# Patient Record
Sex: Female | Born: 1955 | Race: White | Hispanic: No | Marital: Married | Smoking: Never smoker
Health system: Southern US, Community
[De-identification: ages and names within clinical notes are randomized; demographics above are authoritative.]

## PROBLEM LIST (undated history)

## (undated) DIAGNOSIS — K219 Gastro-esophageal reflux disease without esophagitis: Secondary | ICD-10-CM

## (undated) DIAGNOSIS — E7800 Pure hypercholesterolemia, unspecified: Secondary | ICD-10-CM

## (undated) DIAGNOSIS — I2510 Atherosclerotic heart disease of native coronary artery without angina pectoris: Secondary | ICD-10-CM

## (undated) DIAGNOSIS — I219 Acute myocardial infarction, unspecified: Secondary | ICD-10-CM

## (undated) DIAGNOSIS — E119 Type 2 diabetes mellitus without complications: Secondary | ICD-10-CM

## (undated) DIAGNOSIS — I4891 Unspecified atrial fibrillation: Secondary | ICD-10-CM

## (undated) DIAGNOSIS — J45909 Unspecified asthma, uncomplicated: Secondary | ICD-10-CM

## (undated) DIAGNOSIS — M542 Cervicalgia: Secondary | ICD-10-CM

## (undated) DIAGNOSIS — M545 Low back pain, unspecified: Secondary | ICD-10-CM

## (undated) DIAGNOSIS — K259 Gastric ulcer, unspecified as acute or chronic, without hemorrhage or perforation: Secondary | ICD-10-CM

## (undated) DIAGNOSIS — L509 Urticaria, unspecified: Secondary | ICD-10-CM

## (undated) DIAGNOSIS — G8929 Other chronic pain: Secondary | ICD-10-CM

## (undated) DIAGNOSIS — R200 Anesthesia of skin: Secondary | ICD-10-CM

## (undated) DIAGNOSIS — R519 Headache, unspecified: Secondary | ICD-10-CM

## (undated) DIAGNOSIS — Z7901 Long term (current) use of anticoagulants: Secondary | ICD-10-CM

## (undated) DIAGNOSIS — G43909 Migraine, unspecified, not intractable, without status migrainosus: Secondary | ICD-10-CM

## (undated) DIAGNOSIS — J189 Pneumonia, unspecified organism: Secondary | ICD-10-CM

## (undated) DIAGNOSIS — F988 Other specified behavioral and emotional disorders with onset usually occurring in childhood and adolescence: Secondary | ICD-10-CM

## (undated) DIAGNOSIS — G629 Polyneuropathy, unspecified: Secondary | ICD-10-CM

## (undated) DIAGNOSIS — I10 Essential (primary) hypertension: Secondary | ICD-10-CM

## (undated) DIAGNOSIS — R51 Headache: Secondary | ICD-10-CM

## (undated) DIAGNOSIS — F419 Anxiety disorder, unspecified: Secondary | ICD-10-CM

## (undated) DIAGNOSIS — R011 Cardiac murmur, unspecified: Secondary | ICD-10-CM

## (undated) DIAGNOSIS — E041 Nontoxic single thyroid nodule: Secondary | ICD-10-CM

## (undated) DIAGNOSIS — M1990 Unspecified osteoarthritis, unspecified site: Secondary | ICD-10-CM

## (undated) DIAGNOSIS — E669 Obesity, unspecified: Secondary | ICD-10-CM

## (undated) DIAGNOSIS — E785 Hyperlipidemia, unspecified: Secondary | ICD-10-CM

## (undated) DIAGNOSIS — I4819 Other persistent atrial fibrillation: Secondary | ICD-10-CM

## (undated) DIAGNOSIS — F909 Attention-deficit hyperactivity disorder, unspecified type: Secondary | ICD-10-CM

## (undated) DIAGNOSIS — I209 Angina pectoris, unspecified: Secondary | ICD-10-CM

## (undated) HISTORY — PX: TONSILLECTOMY: SUR1361

## (undated) HISTORY — PX: CORONARY ANGIOPLASTY WITH STENT PLACEMENT: SHX49

## (undated) HISTORY — PX: ADENOIDECTOMY: SUR15

## (undated) HISTORY — DX: Polyneuropathy, unspecified: G62.9

## (undated) HISTORY — PX: ABDOMINAL HYSTERECTOMY: SHX81

## (undated) HISTORY — DX: Long term (current) use of anticoagulants: Z79.01

## (undated) HISTORY — DX: Unspecified atrial fibrillation: I48.91

## (undated) HISTORY — DX: Anesthesia of skin: R20.0

## (undated) HISTORY — DX: Atherosclerotic heart disease of native coronary artery without angina pectoris: I25.10

## (undated) HISTORY — PX: BIOPSY THYROID: PRO38

## (undated) HISTORY — DX: Other specified behavioral and emotional disorders with onset usually occurring in childhood and adolescence: F98.8

## (undated) HISTORY — DX: Acute myocardial infarction, unspecified: I21.9

## (undated) HISTORY — DX: Essential (primary) hypertension: I10

## (undated) HISTORY — DX: Type 2 diabetes mellitus without complications: E11.9

## (undated) HISTORY — DX: Hyperlipidemia, unspecified: E78.5

## (undated) HISTORY — PX: BREAST EXCISIONAL BIOPSY: SUR124

## (undated) HISTORY — PX: TRIGGER FINGER RELEASE: SHX641

## (undated) HISTORY — DX: Urticaria, unspecified: L50.9

## (undated) HISTORY — PX: TONSILLECTOMY AND ADENOIDECTOMY: SUR1326

## (undated) HISTORY — DX: Obesity, unspecified: E66.9

---
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
## 1981-02-28 DIAGNOSIS — Z9289 Personal history of other medical treatment: Secondary | ICD-10-CM

## 1981-02-28 HISTORY — DX: Personal history of other medical treatment: Z92.89

## 2016-03-21 ENCOUNTER — Encounter (INDEPENDENT_AMBULATORY_CARE_PROVIDER_SITE_OTHER): Payer: Self-pay

## 2016-03-21 ENCOUNTER — Ambulatory Visit (INDEPENDENT_AMBULATORY_CARE_PROVIDER_SITE_OTHER): Payer: Commercial Managed Care - PPO | Admitting: Cardiovascular Disease

## 2016-03-21 ENCOUNTER — Encounter: Payer: Self-pay | Admitting: Cardiovascular Disease

## 2016-03-21 VITALS — BP 140/74 | HR 57 | Ht 68.0 in | Wt 206.8 lb

## 2016-03-21 DIAGNOSIS — Z79899 Other long term (current) drug therapy: Secondary | ICD-10-CM

## 2016-03-21 DIAGNOSIS — E7800 Pure hypercholesterolemia, unspecified: Secondary | ICD-10-CM

## 2016-03-21 DIAGNOSIS — R011 Cardiac murmur, unspecified: Secondary | ICD-10-CM | POA: Diagnosis not present

## 2016-03-21 NOTE — Patient Instructions (Signed)
Medication Instructions:  The current medical regimen is effective;  continue present plan and medications.  Labwork: Please schedule to come back in the next several days to have fasting blood work.  (BMP, LFT and Lipid)  Testing/Procedures: Your physician has requested that you have an echocardiogram. Echocardiography is a painless test that uses sound waves to create images of your heart. It provides your doctor with information about the size and shape of your heart and how well your heart's chambers and valves are working. This procedure takes approximately one hour. There are no restrictions for this procedure.  Follow-Up: Follow up in 6 months with Dr. Acie Fredrickson.  You will receive a letter in the mail 2 months before you are due.  Please call us when you receive this letter to schedule your follow up appointment.  If you need a refill on your cardiac medications before your next appointment, please call your pharmacy.  Thank you for choosing [facility]!!

## 2016-03-21 NOTE — Progress Notes (Signed)
Cardiology Office Note   Date:  03/21/2016   ID:  Brittany Buckley, DOB [birth year], MRN [id number]  PCP:  Marton Redwood, MD  Cardiologist:   Mertie Moores, MD   Chief Complaint  Patient presents with  . Coronary Artery Disease  . Hyperlipidemia   Problem list 1. Coronary artery disease-status post PCI in 2014, 2015 [place], [place]. [place]  Dr. Marjory Lies.  2. Essential hypertension 3. Hyperlipidemia 4. Paroxysmal atrial fibrillation   History of Present Illness: Brittany Buckley is a 61 y.o. female who presents for further evaluation and management of her history of coronary artery disease.  Has some left sided neck pain   Has rare episodes of palpitations at night - discussed that these are likely PVCs. She has had some PAF in the past.   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    Does not get any regular exercise .   Lack of energy. Tired at the end of the day . Admits that she is a couch potato.     Past Medical History:  Diagnosis Date  . A-fib ([place])   . ADD (attention deficit disorder)   . Coronary artery disease   . Diabetes mellitus without complication ([place])   . Hyperlipidemia   . Hypertension   . MI (myocardial infarction)   . Numbness of toes   . Obesity   . On anticoagulant therapy   . Peripheral neuropathy ([place])   . Urticaria     Past Surgical History:  Procedure Laterality Date  . FINGER SURGERY    . PERCUTANEOUS CORONARY STENT INTERVENTION (PCI-S)  2014 & 2015   x2   . TONSILLECTOMY       Current Outpatient Prescriptions  Medication Sig Dispense Refill  . atorvastatin (LIPITOR) 80 MG tablet Take 80 mg by mouth daily.    . dabigatran (PRADAXA) 150 MG CAPS capsule Take 150 mg by mouth 2 (two) times daily.    . furosemide (LASIX) 40 MG tablet Take 40 mg by mouth daily.    Marland Kitchen glipiZIDE (GLUCOTROL) 5 MG tablet Take by mouth daily before breakfast.    . hydrALAZINE (APRESOLINE) 50 MG tablet Take 50 mg by mouth 3 (three) times daily.    Marland Kitchen  lisinopril (PRINIVIL,ZESTRIL) 40 MG tablet Take 40 mg by mouth daily.    . metFORMIN (GLUCOPHAGE) 1000 MG tablet Take 1,000 mg by mouth 2 (two) times daily with a meal.    . metoprolol (LOPRESSOR) 50 MG tablet Take 50 mg by mouth 2 (two) times daily.    . nitroGLYCERIN (NITROSTAT) 0.4 MG SL tablet Place 0.4 mg under the tongue every 5 (five) minutes as needed for chest pain.    . RaNITidine HCl (ZANTAC PO) Take by mouth.    . SUMAtriptan (IMITREX) 100 MG tablet Take 100 mg by mouth every 2 (two) hours as needed for migraine. May repeat in 2 hours if headache persists or recurs.     No current facility-administered medications for this visit.     Allergies:   Codeine and Demerol [meperidine]    Social History:  The patient  reports that she has never smoked. She has never used smokeless tobacco. She reports that she drinks alcohol. She reports that she does not use drugs.   Family History:  The patient's family history includes CAD in her father; Cancer - Ovarian in her sister; Diabetes in her maternal grandmother and paternal grandfather; Healthy in her son; Heart attack in her father; Heart  disease in her paternal grandmother; Hyperlipidemia in her mother; Obesity in her daughter.    ROS:  Please see the history of present illness.    Review of Systems: Constitutional:  denies fever, chills, diaphoresis, appetite change and fatigue.  HEENT: denies photophobia, eye pain, redness, hearing loss, ear pain, congestion, sore throat, rhinorrhea, sneezing, neck pain, neck stiffness and tinnitus.  Respiratory: denies SOB, DOE, cough, chest tightness, and wheezing.  Cardiovascular: denies chest pain, palpitations and leg swelling.  Gastrointestinal: denies nausea, vomiting, abdominal pain, diarrhea, constipation, blood in stool.  Genitourinary: denies dysuria, urgency, frequency, hematuria, flank pain and difficulty urinating.  Musculoskeletal: denies  myalgias, back pain, joint swelling,  arthralgias and gait problem.   Skin: denies pallor, rash and wound.  Neurological: denies dizziness, seizures, syncope, weakness, light-headedness, numbness and headaches.   Hematological: denies adenopathy, easy bruising, personal or family bleeding history.  Psychiatric/ Behavioral: denies suicidal ideation, mood changes, confusion, nervousness, sleep disturbance and agitation.       All other systems are reviewed and negative.    PHYSICAL EXAM: VS:  BP 140/74 (BP Location: Left Arm, Patient Position: Sitting, Cuff Size: Large)   Pulse (!) 57   Ht 5\' 8"  (1.727 m)   Wt 206 lb 12.8 oz (93.8 kg)   SpO2 95%   BMI 31.44 kg/m  , BMI Body mass index is 31.44 kg/m. GEN: Well nourished, well developed, in no acute distress  HEENT: normal  Neck: no JVD, soft B carotid bruits vs. Radiation from her cardiac murmur   Cardiac: RRR;  1-2 / 6 brief systolic murmur at ULSB radiating to URSB.   ,   Respiratory:  clear to auscultation bilaterally, normal work of breathing GI: soft, nontender, nondistended, + BS MS: no deformity or atrophy  Skin: warm and dry, no rash Neuro:  Strength and sensation are intact Psych: normal   EKG:  EKG is ordered today. The ekg ordered today demonstrates  Sinus brady at 57.  Otherwise normal eCG    Recent Labs: No results found for requested labs within last 8760 hours.    Lipid Panel No results found for: CHOL, TRIG, HDL, CHOLHDL, VLDL, LDLCALC, LDLDIRECT    Wt Readings from Last 3 Encounters:  03/21/16 206 lb 12.8 oz (93.8 kg)      Other studies Reviewed: Additional studies/ records that were reviewed today include: . Review of the above records demonstrates:    ASSESSMENT AND PLAN:  1. CAD- She seems to be stable. She's not having any episodes of angina.  2. Murmur  - she has a soft systolic murmur. She may have some mild aortic stenosis. We will get an echo card gram for further evaluation.     Current medicines are reviewed at length  with the patient today.  The patient has concerns regarding medicines.  Labs/ tests ordered today include:  No orders of the defined types were placed in this encounter.  Disposition:   FU with me in 1 year.     Mertie Moores, MD  03/21/2016 4:11 PM    [facility] [address], [place], [place]  [address] Phone: [phone]; Fax: [phone]

## 2016-03-23 ENCOUNTER — Encounter: Payer: Self-pay | Admitting: Cardiovascular Disease

## 2016-04-04 ENCOUNTER — Other Ambulatory Visit: Payer: Commercial Managed Care - PPO | Admitting: *Deleted

## 2016-04-04 ENCOUNTER — Ambulatory Visit (HOSPITAL_COMMUNITY): Payer: Commercial Managed Care - PPO | Attending: Cardiovascular Disease

## 2016-04-04 ENCOUNTER — Other Ambulatory Visit: Payer: Self-pay

## 2016-04-04 DIAGNOSIS — I2510 Atherosclerotic heart disease of native coronary artery without angina pectoris: Secondary | ICD-10-CM | POA: Diagnosis not present

## 2016-04-04 DIAGNOSIS — Z79899 Other long term (current) drug therapy: Secondary | ICD-10-CM

## 2016-04-04 DIAGNOSIS — R011 Cardiac murmur, unspecified: Secondary | ICD-10-CM

## 2016-04-04 DIAGNOSIS — E7800 Pure hypercholesterolemia, unspecified: Secondary | ICD-10-CM | POA: Diagnosis not present

## 2016-04-04 DIAGNOSIS — Z719 Counseling, unspecified: Secondary | ICD-10-CM | POA: Diagnosis not present

## 2016-04-04 LAB — ECHOCARDIOGRAM COMPLETE
Ao-asc: 31 cm
CHL CUP DOP CALC LVOT VTI: 33 cm
CHL CUP MV DEC (S): 225
EERAT: 16.06
EWDT: 225 ms
FS: 32 % (ref 28–44)
IVS/LV PW RATIO, ED: 1.32
LA ID, A-P, ES: 43 mm
LADIAMINDEX: 2.08 cm/m2
LAVOL: 72 mL
LAVOLA4C: 54 mL
LAVOLIN: 34.8 mL/m2
LEFT ATRIUM END SYS DIAM: 43 mm
LV E/e' medial: 16.06
LV SIMPSON'S DISK: 65
LV TDI E'MEDIAL: 5.46
LV dias vol index: 42 mL/m2
LV dias vol: 86 mL (ref 46–106)
LV sys vol: 30 mL (ref 14–42)
LVEEAVG: 16.06
LVELAT: 5.26 cm/s
LVOT SV: 94 mL
LVOT area: 2.84 cm2
LVOT diameter: 19 mm
LVOT peak grad rest: 7 mmHg
LVOT peak vel: 134 cm/s
LVSYSVOLIN: 14 mL/m2
MV pk A vel: 84.5 m/s
MVPG: 3 mmHg
MVPKEVEL: 84.5 m/s
PW: 11.3 mm — AB (ref 0.6–1.1)
S' Lateral: 13.5 cm/s
Stroke v: 56 ml
TDI e' lateral: 5.26

## 2016-04-05 LAB — LIPID PANEL
CHOLESTEROL TOTAL: 179 mg/dL (ref 100–199)
Chol/HDL Ratio: 3.8 ratio units (ref 0.0–4.4)
HDL: 47 mg/dL (ref >39)
LDL Calculated: 96 mg/dL (ref 0–99)
TRIGLYCERIDES: 180 mg/dL — AB (ref 0–149)
VLDL Cholesterol Cal: 36 mg/dL (ref 5–40)

## 2016-04-05 LAB — HEPATIC FUNCTION PANEL
ALT: 18 IU/L (ref 0–32)
AST: 15 IU/L (ref 0–40)
Albumin: 4.3 g/dL (ref 3.5–5.5)
Alkaline Phosphatase: 75 IU/L (ref 39–117)
Bilirubin Total: 0.3 mg/dL (ref 0.0–1.2)
Bilirubin, Direct: 0.08 mg/dL (ref 0.00–0.40)
Total Protein: 6.5 g/dL (ref 6.0–8.5)

## 2016-04-05 LAB — BASIC METABOLIC PANEL
BUN/Creatinine Ratio: 32 — ABNORMAL HIGH (ref 9–23)
BUN: 23 mg/dL (ref 6–24)
CALCIUM: 9 mg/dL (ref 8.7–10.2)
CHLORIDE: 104 mmol/L (ref 96–106)
CO2: 25 mmol/L (ref 18–29)
Creatinine, Ser: 0.71 mg/dL (ref 0.57–1.00)
GFR calc non Af Amer: 94 mL/min/{1.73_m2} (ref >59)
GFR, EST AFRICAN AMERICAN: 108 mL/min/{1.73_m2} (ref >59)
GLUCOSE: 139 mg/dL — AB (ref 65–99)
POTASSIUM: 4.2 mmol/L (ref 3.5–5.2)
Sodium: 143 mmol/L (ref 134–144)

## 2016-04-07 ENCOUNTER — Encounter: Payer: Self-pay | Admitting: Nurse Practitioner

## 2016-04-11 DIAGNOSIS — Z719 Counseling, unspecified: Secondary | ICD-10-CM | POA: Diagnosis not present

## 2016-04-19 DIAGNOSIS — Z719 Counseling, unspecified: Secondary | ICD-10-CM | POA: Diagnosis not present

## 2016-04-25 DIAGNOSIS — Z719 Counseling, unspecified: Secondary | ICD-10-CM | POA: Diagnosis not present

## 2016-05-02 DIAGNOSIS — Z719 Counseling, unspecified: Secondary | ICD-10-CM | POA: Diagnosis not present

## 2016-05-09 DIAGNOSIS — Z719 Counseling, unspecified: Secondary | ICD-10-CM | POA: Diagnosis not present

## 2016-05-17 DIAGNOSIS — Z719 Counseling, unspecified: Secondary | ICD-10-CM | POA: Diagnosis not present

## 2016-05-23 DIAGNOSIS — Z719 Counseling, unspecified: Secondary | ICD-10-CM | POA: Diagnosis not present

## 2016-06-06 DIAGNOSIS — Z719 Counseling, unspecified: Secondary | ICD-10-CM | POA: Diagnosis not present

## 2016-06-10 DIAGNOSIS — I10 Essential (primary) hypertension: Secondary | ICD-10-CM | POA: Diagnosis not present

## 2016-06-10 DIAGNOSIS — E1149 Type 2 diabetes mellitus with other diabetic neurological complication: Secondary | ICD-10-CM | POA: Diagnosis not present

## 2016-06-13 DIAGNOSIS — Z719 Counseling, unspecified: Secondary | ICD-10-CM | POA: Diagnosis not present

## 2016-06-14 ENCOUNTER — Emergency Department (HOSPITAL_COMMUNITY): Payer: Commercial Managed Care - PPO

## 2016-06-14 ENCOUNTER — Inpatient Hospital Stay (HOSPITAL_COMMUNITY)
Admission: EM | Admit: 2016-06-14 | Discharge: 2016-06-17 | DRG: 310 | Disposition: A | Discharge status: Home / Self Care | Payer: Commercial Managed Care - PPO | Attending: Cardiovascular Disease | Admitting: Cardiovascular Disease

## 2016-06-14 ENCOUNTER — Encounter (HOSPITAL_COMMUNITY): Payer: Self-pay

## 2016-06-14 DIAGNOSIS — Z79899 Other long term (current) drug therapy: Secondary | ICD-10-CM

## 2016-06-14 DIAGNOSIS — Z7984 Long term (current) use of oral hypoglycemic drugs: Secondary | ICD-10-CM | POA: Diagnosis not present

## 2016-06-14 DIAGNOSIS — R079 Chest pain, unspecified: Secondary | ICD-10-CM | POA: Diagnosis not present

## 2016-06-14 DIAGNOSIS — I208 Other forms of angina pectoris: Secondary | ICD-10-CM | POA: Diagnosis not present

## 2016-06-14 DIAGNOSIS — E1169 Type 2 diabetes mellitus with other specified complication: Secondary | ICD-10-CM

## 2016-06-14 DIAGNOSIS — I358 Other nonrheumatic aortic valve disorders: Secondary | ICD-10-CM | POA: Diagnosis not present

## 2016-06-14 DIAGNOSIS — T462X5A Adverse effect of other antidysrhythmic drugs, initial encounter: Secondary | ICD-10-CM | POA: Diagnosis not present

## 2016-06-14 DIAGNOSIS — I25119 Atherosclerotic heart disease of native coronary artery with unspecified angina pectoris: Secondary | ICD-10-CM | POA: Diagnosis not present

## 2016-06-14 DIAGNOSIS — R000 Tachycardia, unspecified: Secondary | ICD-10-CM | POA: Diagnosis not present

## 2016-06-14 DIAGNOSIS — I480 Paroxysmal atrial fibrillation: Secondary | ICD-10-CM | POA: Diagnosis not present

## 2016-06-14 DIAGNOSIS — Z821 Family history of blindness and visual loss: Secondary | ICD-10-CM

## 2016-06-14 DIAGNOSIS — Z8041 Family history of malignant neoplasm of ovary: Secondary | ICD-10-CM | POA: Diagnosis not present

## 2016-06-14 DIAGNOSIS — E669 Obesity, unspecified: Secondary | ICD-10-CM | POA: Diagnosis not present

## 2016-06-14 DIAGNOSIS — Y92230 Patient room in hospital as the place of occurrence of the external cause: Secondary | ICD-10-CM | POA: Diagnosis not present

## 2016-06-14 DIAGNOSIS — R0602 Shortness of breath: Secondary | ICD-10-CM | POA: Diagnosis not present

## 2016-06-14 DIAGNOSIS — Z6830 Body mass index (BMI) 30.0-30.9, adult: Secondary | ICD-10-CM

## 2016-06-14 DIAGNOSIS — I10 Essential (primary) hypertension: Secondary | ICD-10-CM | POA: Diagnosis present

## 2016-06-14 DIAGNOSIS — I25118 Atherosclerotic heart disease of native coronary artery with other forms of angina pectoris: Secondary | ICD-10-CM | POA: Diagnosis not present

## 2016-06-14 DIAGNOSIS — I700 Atherosclerosis of aorta: Secondary | ICD-10-CM | POA: Diagnosis present

## 2016-06-14 DIAGNOSIS — Z955 Presence of coronary angioplasty implant and graft: Secondary | ICD-10-CM

## 2016-06-14 DIAGNOSIS — Z7901 Long term (current) use of anticoagulants: Secondary | ICD-10-CM

## 2016-06-14 DIAGNOSIS — R0789 Other chest pain: Secondary | ICD-10-CM | POA: Diagnosis not present

## 2016-06-14 DIAGNOSIS — E1142 Type 2 diabetes mellitus with diabetic polyneuropathy: Secondary | ICD-10-CM | POA: Diagnosis not present

## 2016-06-14 DIAGNOSIS — E785 Hyperlipidemia, unspecified: Secondary | ICD-10-CM | POA: Diagnosis not present

## 2016-06-14 DIAGNOSIS — I4581 Long QT syndrome: Secondary | ICD-10-CM | POA: Diagnosis present

## 2016-06-14 DIAGNOSIS — I252 Old myocardial infarction: Secondary | ICD-10-CM

## 2016-06-14 DIAGNOSIS — I472 Ventricular tachycardia: Secondary | ICD-10-CM | POA: Diagnosis not present

## 2016-06-14 DIAGNOSIS — I959 Hypotension, unspecified: Secondary | ICD-10-CM | POA: Diagnosis not present

## 2016-06-14 DIAGNOSIS — Z885 Allergy status to narcotic agent status: Secondary | ICD-10-CM

## 2016-06-14 DIAGNOSIS — Z8249 Family history of ischemic heart disease and other diseases of the circulatory system: Secondary | ICD-10-CM

## 2016-06-14 DIAGNOSIS — Z833 Family history of diabetes mellitus: Secondary | ICD-10-CM | POA: Diagnosis not present

## 2016-06-14 DIAGNOSIS — R011 Cardiac murmur, unspecified: Secondary | ICD-10-CM | POA: Diagnosis present

## 2016-06-14 DIAGNOSIS — I4891 Unspecified atrial fibrillation: Secondary | ICD-10-CM

## 2016-06-14 HISTORY — DX: Cardiac murmur, unspecified: R01.1

## 2016-06-14 HISTORY — DX: Type 2 diabetes mellitus without complications: E11.9

## 2016-06-14 HISTORY — DX: Unspecified osteoarthritis, unspecified site: M19.90

## 2016-06-14 HISTORY — DX: Headache, unspecified: R51.9

## 2016-06-14 HISTORY — DX: Migraine, unspecified, not intractable, without status migrainosus: G43.909

## 2016-06-14 HISTORY — DX: Other chronic pain: G89.29

## 2016-06-14 HISTORY — DX: Pneumonia, unspecified organism: J18.9

## 2016-06-14 HISTORY — DX: Gastro-esophageal reflux disease without esophagitis: K21.9

## 2016-06-14 HISTORY — DX: Nontoxic single thyroid nodule: E04.1

## 2016-06-14 HISTORY — DX: Unspecified asthma, uncomplicated: J45.909

## 2016-06-14 HISTORY — DX: Cervicalgia: M54.2

## 2016-06-14 HISTORY — DX: Low back pain, unspecified: M54.50

## 2016-06-14 HISTORY — DX: Angina pectoris, unspecified: I20.9

## 2016-06-14 HISTORY — DX: Low back pain: M54.5

## 2016-06-14 HISTORY — DX: Headache: R51

## 2016-06-14 HISTORY — DX: Anxiety disorder, unspecified: F41.9

## 2016-06-14 LAB — BASIC METABOLIC PANEL
ANION GAP: 9 (ref 5–15)
Anion gap: 15 (ref 5–15)
BUN: 15 mg/dL (ref 6–20)
BUN: 17 mg/dL (ref 6–20)
CALCIUM: 8.9 mg/dL (ref 8.9–10.3)
CHLORIDE: 107 mmol/L (ref 101–111)
CO2: 24 mmol/L (ref 22–32)
CO2: 22 mmol/L (ref 22–32)
CREATININE: 0.86 mg/dL (ref 0.44–1.00)
Calcium: 8.1 mg/dL — ABNORMAL LOW (ref 8.9–10.3)
Chloride: 102 mmol/L (ref 101–111)
Creatinine, Ser: 0.83 mg/dL (ref 0.44–1.00)
GFR calc non Af Amer: >60 mL/min (ref >60)
GFR calc non Af Amer: >60 mL/min (ref >60)
Glucose, Bld: 196 mg/dL — ABNORMAL HIGH (ref 65–99)
Glucose, Bld: 74 mg/dL (ref 65–99)
POTASSIUM: 4 mmol/L (ref 3.5–5.1)
Potassium: 3.8 mmol/L (ref 3.5–5.1)
SODIUM: 141 mmol/L (ref 135–145)
SODIUM: 138 mmol/L (ref 135–145)

## 2016-06-14 LAB — CBC
HCT: 42.3 % (ref 36.0–46.0)
Hemoglobin: 14.1 g/dL (ref 12.0–15.0)
MCH: 27.8 pg (ref 26.0–34.0)
MCHC: 33.3 g/dL (ref 30.0–36.0)
MCV: 83.3 fL (ref 78.0–100.0)
PLATELETS: 182 10*3/uL (ref 150–400)
RBC: 5.08 MIL/uL (ref 3.87–5.11)
RDW: 14 % (ref 11.5–15.5)
WBC: 8.6 10*3/uL (ref 4.0–10.5)

## 2016-06-14 LAB — MAGNESIUM
MAGNESIUM: 1.5 mg/dL — AB (ref 1.7–2.4)
Magnesium: 2.3 mg/dL (ref 1.7–2.4)

## 2016-06-14 LAB — TSH: TSH: 0.923 u[IU]/mL (ref 0.350–4.500)

## 2016-06-14 LAB — I-STAT TROPONIN, ED: TROPONIN I, POC: 0 ng/mL (ref 0.00–0.08)

## 2016-06-14 LAB — POTASSIUM: POTASSIUM: 3.7 mmol/L (ref 3.5–5.1)

## 2016-06-14 MED ORDER — SODIUM CHLORIDE 0.9% FLUSH
3.0000 mL | Freq: Two times a day (BID) | INTRAVENOUS | Status: DC
  Administered 2016-06-16: 3 mL via INTRAVENOUS

## 2016-06-14 MED ORDER — ONDANSETRON HCL 4 MG/2ML IJ SOLN: 4.0000 mg | Freq: Four times a day (QID) | INTRAMUSCULAR | Status: DC | PRN

## 2016-06-14 MED ORDER — FUROSEMIDE 40 MG PO TABS
40.0000 mg | ORAL_TABLET | Freq: Every day | ORAL | Status: DC
  Administered 2016-06-15 – 2016-06-17 (×3): 40 mg via ORAL
  Filled 2016-06-14 (×3): qty 1

## 2016-06-14 MED ORDER — HYDRALAZINE HCL 50 MG PO TABS
50.0000 mg | ORAL_TABLET | Freq: Three times a day (TID) | ORAL | Status: DC
  Administered 2016-06-14 – 2016-06-17 (×9): 50 mg via ORAL
  Filled 2016-06-14 (×9): qty 1

## 2016-06-14 MED ORDER — SODIUM CHLORIDE 0.9% FLUSH
3.0000 mL | Freq: Two times a day (BID) | INTRAVENOUS | Status: DC
  Administered 2016-06-14 – 2016-06-16 (×5): 3 mL via INTRAVENOUS

## 2016-06-14 MED ORDER — POTASSIUM CHLORIDE CRYS ER 20 MEQ PO TBCR: 30.0000 meq | EXTENDED_RELEASE_TABLET | ORAL | Status: DC

## 2016-06-14 MED ORDER — ACETAMINOPHEN 325 MG PO TABS
650.0000 mg | ORAL_TABLET | ORAL | Status: DC | PRN
  Administered 2016-06-14 – 2016-06-17 (×6): 650 mg via ORAL
  Filled 2016-06-14 (×6): qty 2

## 2016-06-14 MED ORDER — DILTIAZEM HCL ER COATED BEADS 120 MG PO CP24
120.0000 mg | ORAL_CAPSULE | Freq: Every day | ORAL | Status: DC
  Administered 2016-06-14 – 2016-06-17 (×4): 120 mg via ORAL
  Filled 2016-06-14 (×4): qty 1

## 2016-06-14 MED ORDER — ASPIRIN EC 325 MG PO TBEC
325.0000 mg | DELAYED_RELEASE_TABLET | Freq: Once | ORAL | Status: AC
  Administered 2016-06-14: 325 mg via ORAL
  Filled 2016-06-14: qty 1

## 2016-06-14 MED ORDER — GLIPIZIDE 5 MG PO TABS
5.0000 mg | ORAL_TABLET | Freq: Every day | ORAL | Status: DC
  Administered 2016-06-15 – 2016-06-17 (×3): 5 mg via ORAL
  Filled 2016-06-14 (×3): qty 1

## 2016-06-14 MED ORDER — SODIUM CHLORIDE 0.9 % IV SOLN: 250.0000 mL | INTRAVENOUS | Status: DC | PRN

## 2016-06-14 MED ORDER — POTASSIUM CHLORIDE CRYS ER 20 MEQ PO TBCR
40.0000 meq | EXTENDED_RELEASE_TABLET | ORAL | Status: AC
  Administered 2016-06-14 (×2): 40 meq via ORAL
  Filled 2016-06-14 (×2): qty 2

## 2016-06-14 MED ORDER — NITROGLYCERIN 0.4 MG SL SUBL
0.4000 mg | SUBLINGUAL_TABLET | SUBLINGUAL | Status: DC | PRN
  Administered 2016-06-14: 0.4 mg via SUBLINGUAL
  Filled 2016-06-14: qty 1

## 2016-06-14 MED ORDER — METOPROLOL TARTRATE 5 MG/5ML IV SOLN
5.0000 mg | Freq: Once | INTRAVENOUS | Status: AC
  Administered 2016-06-14: 5 mg via INTRAVENOUS
  Filled 2016-06-14: qty 5

## 2016-06-14 MED ORDER — SODIUM CHLORIDE 0.9% FLUSH: 3.0000 mL | INTRAVENOUS | Status: DC | PRN

## 2016-06-14 MED ORDER — METOPROLOL TARTRATE 25 MG PO TABS
75.0000 mg | ORAL_TABLET | Freq: Two times a day (BID) | ORAL | Status: DC
  Administered 2016-06-14 – 2016-06-17 (×3): 75 mg via ORAL
  Filled 2016-06-14 (×6): qty 1

## 2016-06-14 MED ORDER — ETOMIDATE 2 MG/ML IV SOLN
0.1500 mg/kg | Freq: Once | INTRAVENOUS | Status: AC
  Administered 2016-06-14: 13.42 mg via INTRAVENOUS
  Filled 2016-06-14: qty 10

## 2016-06-14 MED ORDER — DILTIAZEM HCL-DEXTROSE 100-5 MG/100ML-% IV SOLN (PREMIX)
5.0000 mg/h | INTRAVENOUS | Status: DC
  Administered 2016-06-14: 5 mg/h via INTRAVENOUS
  Filled 2016-06-14: qty 100

## 2016-06-14 MED ORDER — AMIODARONE HCL IN DEXTROSE 360-4.14 MG/200ML-% IV SOLN: 30.0000 mg/h | INTRAVENOUS | Status: DC

## 2016-06-14 MED ORDER — MORPHINE SULFATE (PF) 4 MG/ML IV SOLN
2.0000 mg | Freq: Once | INTRAVENOUS | Status: AC
  Administered 2016-06-14: 2 mg via INTRAVENOUS
  Filled 2016-06-14: qty 1

## 2016-06-14 MED ORDER — LISINOPRIL 40 MG PO TABS
40.0000 mg | ORAL_TABLET | Freq: Every day | ORAL | Status: DC
  Administered 2016-06-15 – 2016-06-17 (×3): 40 mg via ORAL
  Filled 2016-06-14 (×3): qty 1

## 2016-06-14 MED ORDER — AMIODARONE LOAD VIA INFUSION
150.0000 mg | Freq: Once | INTRAVENOUS | Status: AC
  Administered 2016-06-14: 150 mg via INTRAVENOUS
  Filled 2016-06-14: qty 83.34

## 2016-06-14 MED ORDER — DABIGATRAN ETEXILATE MESYLATE 150 MG PO CAPS
150.0000 mg | ORAL_CAPSULE | Freq: Two times a day (BID) | ORAL | Status: DC
  Administered 2016-06-14 – 2016-06-17 (×6): 150 mg via ORAL
  Filled 2016-06-14 (×6): qty 1

## 2016-06-14 MED ORDER — DOFETILIDE 500 MCG PO CAPS
500.0000 ug | ORAL_CAPSULE | Freq: Two times a day (BID) | ORAL | Status: DC
  Administered 2016-06-14: 500 ug via ORAL
  Filled 2016-06-14: qty 1

## 2016-06-14 MED ORDER — MAGNESIUM SULFATE 2 GM/50ML IV SOLN
2.0000 g | Freq: Once | INTRAVENOUS | Status: AC
  Administered 2016-06-14: 2 g via INTRAVENOUS
  Filled 2016-06-14: qty 50

## 2016-06-14 MED ORDER — AMIODARONE HCL IN DEXTROSE 360-4.14 MG/200ML-% IV SOLN
60.0000 mg/h | INTRAVENOUS | Status: DC
  Administered 2016-06-14: 60 mg/h via INTRAVENOUS

## 2016-06-14 MED ORDER — POTASSIUM CHLORIDE CRYS ER 20 MEQ PO TBCR
40.0000 meq | EXTENDED_RELEASE_TABLET | Freq: Once | ORAL | Status: AC
  Administered 2016-06-14: 40 meq via ORAL
  Filled 2016-06-14: qty 2

## 2016-06-14 MED ORDER — METFORMIN HCL 500 MG PO TABS
1000.0000 mg | ORAL_TABLET | Freq: Two times a day (BID) | ORAL | Status: DC
  Administered 2016-06-14 – 2016-06-17 (×6): 1000 mg via ORAL
  Filled 2016-06-14 (×7): qty 2

## 2016-06-14 MED ORDER — METOPROLOL TARTRATE 5 MG/5ML IV SOLN: 2.5000 mg | Freq: Once | INTRAVENOUS | Status: DC

## 2016-06-14 MED ORDER — ATORVASTATIN CALCIUM 80 MG PO TABS
80.0000 mg | ORAL_TABLET | Freq: Every day | ORAL | Status: DC
  Administered 2016-06-15 – 2016-06-17 (×3): 80 mg via ORAL
  Filled 2016-06-14 (×3): qty 1

## 2016-06-14 NOTE — ED Triage Notes (Signed)
Pt. Here for centralized chest pain starting this morning while showering. Pt. States she also had nausea, lightheadness, dizziness, and SOB. Pt. Hx of MI with 2x stents. Pt. On blood thinner for Afib. Pt. HR ~140 afib upon arrival. Pt. States her pain has improved from 10/10 to 8/10.

## 2016-06-14 NOTE — Sedation Documentation (Signed)
Third shock 200

## 2016-06-14 NOTE — ED Provider Notes (Signed)
[facility] DEPT Provider Note   CSN: [id number] Arrival date & time: 06/14/16  0806     History   Chief Complaint Chief Complaint  Patient presents with  . Chest Pain    HPI Brittany Buckley is a 61 y.o. female.  HPI    61 year old female presents today with complaints of chest pain.  At approximately 7 AM this morning (1.5 hours prior to evaluation) patient notes that she was in the shower when she felt like she got hit in the chest.  She felt nauseous, pain between her shoulders, palpitations and abnormal heart sensation and chest pressure.  Patient notes that she got out of the shower took a nitroglycerin which seemed to slightly improve her symptoms.  She notes that she continued to feel unwell and presented to the emergency room for evaluation.  She notes this feels like "my previous heart attack" with deep pressure on the chest.  Patient notes she had one episode of sharp pain, no longer present only pressure.  Patient also notes weakness and dizziness unable to ambulate at this time.  Patient notes history of A. fib, approximately 2 years ago she had an episode in [place].  Patient also notes a significant past cardiac history with PCI in 2014 in 2015 in [place].  Patient has history hypertension, CAD, hyperlipidemia currently on Pradaxa.  Patient most recently saw Dr. Acie Fredrickson or on March 21, 2016; echo on 04/04/2016 with a left EF of 60-65%.    Past Medical History:  Diagnosis Date  . A-fib ([place])   . ADD (attention deficit disorder)   . Coronary artery disease   . Diabetes mellitus without complication ([place])   . Hyperlipidemia   . Hypertension   . MI (myocardial infarction) ([place])   . Numbness of toes   . Obesity   . On anticoagulant therapy   . Peripheral neuropathy   . Urticaria     Patient Active Problem List   Diagnosis Date Noted  . Atrial fibrillation with rapid ventricular response ([place]) 06/14/2016  . Murmur, cardiac 03/21/2016  .  Hypercholesteremia 03/21/2016    Past Surgical History:  Procedure Laterality Date  . FINGER SURGERY    . PERCUTANEOUS CORONARY STENT INTERVENTION (PCI-S)  2014 & 2015   x2   . TONSILLECTOMY      OB History    No data available      Home Medications    Prior to Admission medications   Medication Sig Start Date End Date Taking? Authorizing Provider  albuterol (PROVENTIL HFA;VENTOLIN HFA) 108 (90 Base) MCG/ACT inhaler Inhale 1-2 puffs into the lungs every 6 (six) hours as needed for wheezing or shortness of breath.   Yes Historical Provider, MD  atorvastatin (LIPITOR) 80 MG tablet Take 80 mg by mouth daily.   Yes Historical Provider, MD  dabigatran (PRADAXA) 150 MG CAPS capsule Take 150 mg by mouth 2 (two) times daily.   Yes Historical Provider, MD  furosemide (LASIX) 40 MG tablet Take 40 mg by mouth daily.   Yes Historical Provider, MD  glipiZIDE (GLUCOTROL) 5 MG tablet Take by mouth daily before breakfast.   Yes Historical Provider, MD  hydrALAZINE (APRESOLINE) 50 MG tablet Take 50 mg by mouth 3 (three) times daily.   Yes Historical Provider, MD  lisinopril (PRINIVIL,ZESTRIL) 40 MG tablet Take 40 mg by mouth daily.   Yes Historical Provider, MD  metFORMIN (GLUCOPHAGE) 1000 MG tablet Take 1,000 mg by mouth 2 (two) times daily with a meal.  Yes Historical Provider, MD  metoprolol (LOPRESSOR) 50 MG tablet Take 50 mg by mouth 2 (two) times daily.   Yes Historical Provider, MD  nitroGLYCERIN (NITROSTAT) 0.4 MG SL tablet Place 0.4 mg under the tongue every 5 (five) minutes as needed for chest pain.   Yes Historical Provider, MD    Family History Family History  Problem Relation Age of Onset  . Hyperlipidemia Mother   . CAD Father   . Heart attack Father   . Cancer - Ovarian Sister   . Diabetes Maternal Grandmother     blindness  . Heart disease Paternal Grandmother   . Diabetes Paternal Grandfather     with amputation  . Healthy Son   . Obesity Daughter     Social  History Social History  Substance Use Topics  . Smoking status: Never Smoker  . Smokeless tobacco: Never Used  . Alcohol use Yes     Comment: 1-2 a week     Allergies   Codeine; Demerol [meperidine]; and Morphine and related   Review of Systems Review of Systems  All other systems reviewed and are negative.   Physical Exam Updated Vital Signs BP 133/78 (BP Location: Right Arm)   Pulse 70   Temp 98.7 F (37.1 C) (Oral)   Resp 19   Ht 5\' 8"  (1.727 m)   Wt 89.4 kg   SpO2 97%   BMI 29.95 kg/m   Physical Exam  Constitutional: She is oriented to person, place, and time. She appears well-developed and well-nourished.  HENT:  Head: Normocephalic and atraumatic.  Eyes: Conjunctivae are normal. Pupils are equal, round, and reactive to light. Right eye exhibits no discharge. Left eye exhibits no discharge. No scleral icterus.  Neck: Normal range of motion. No JVD present. No tracheal deviation present.  Cardiovascular:  Irregularly irregular rhythm  Pulmonary/Chest: Effort normal. No stridor.  Neurological: She is alert and oriented to person, place, and time. Coordination normal.  Psychiatric: She has a normal mood and affect. Her behavior is normal. Judgment and thought content normal.  Nursing note and vitals reviewed.    ED Treatments / Results  Labs (all labs ordered are listed, but only abnormal results are displayed) Labs Reviewed  BASIC METABOLIC PANEL - Abnormal; Notable for the following:       Result Value   Glucose, Bld 196 (*)    All other components within normal limits  MAGNESIUM - Abnormal; Notable for the following:    Magnesium 1.5 (*)    All other components within normal limits  CBC  TSH  MAGNESIUM  POTASSIUM  I-STAT TROPOININ, ED    EKG  EKG Interpretation  Date/Time:  Tuesday June 14 2016 08:14:56 EDT Ventricular Rate:  140 PR Interval:    QRS Duration: 83 QT Interval:  324 QTC Calculation: 495 R Axis:   73 Text Interpretation:   Atrial fibrillation Ventricular premature complex ST depression, consider ischemia, lateral lds Borderline prolonged QT interval agree. no old comparison Confirmed by Johnney Killian, MD, Jeannie Done [phone]) on 06/14/2016 8:48:07 AM       Radiology Dg Chest Port 1 View  Result Date: 06/14/2016 CLINICAL DATA:  Onset of mid chest pain this morning while showering associated with nausea, dizziness, and shortness of breath. History of atrial fibrillation, previous MI, and coronary stent placement. EXAM: PORTABLE CHEST 1 VIEW COMPARISON:  None in PACs FINDINGS: The lungs are well-expanded. There is no focal infiltrate. There is no pleural effusion, pneumothorax, or pneumomediastinum. The heart and pulmonary  vascularity are normal. The mediastinum is normal in width. There is calcification in the wall of the aortic arch. The bony thorax exhibits no acute abnormality. IMPRESSION: There is no CHF, pneumonia, nor other acute cardiopulmonary abnormality. Thoracic aortic atherosclerosis. Electronically Signed   By: David  Martinique M.D.   On: 06/14/2016 09:55    Procedures Procedures (including critical care time)  Medications Ordered in ED Medications  nitroGLYCERIN (NITROSTAT) SL tablet 0.4 mg (0.4 mg Sublingual Given 06/14/16 0847)  metoprolol (LOPRESSOR) injection 2.5 mg (0 mg Intravenous Hold 06/14/16 1019)  atorvastatin (LIPITOR) tablet 80 mg (not administered)  dabigatran (PRADAXA) capsule 150 mg (not administered)  furosemide (LASIX) tablet 40 mg (not administered)  glipiZIDE (GLUCOTROL) tablet 5 mg (not administered)  hydrALAZINE (APRESOLINE) tablet 50 mg (not administered)  lisinopril (PRINIVIL,ZESTRIL) tablet 40 mg (not administered)  metFORMIN (GLUCOPHAGE) tablet 1,000 mg (not administered)  metoprolol tartrate (LOPRESSOR) tablet 75 mg (not administered)  acetaminophen (TYLENOL) tablet 650 mg (650 mg Oral Given 06/14/16 1434)  ondansetron (ZOFRAN) injection 4 mg (not administered)  sodium chloride flush  (NS) 0.9 % injection 3 mL (0 mLs Intravenous Duplicate 1/49/70 2637)  sodium chloride flush (NS) 0.9 % injection 3 mL (not administered)  0.9 %  sodium chloride infusion (not administered)  diltiazem (CARDIZEM CD) 24 hr capsule 120 mg (120 mg Oral Given 06/14/16 1505)  metoprolol (LOPRESSOR) injection 5 mg (5 mg Intravenous Given 06/14/16 0856)  aspirin EC tablet 325 mg (325 mg Oral Given 06/14/16 0855)  morphine 4 MG/ML injection 2 mg (2 mg Intravenous Given 06/14/16 1027)  etomidate (AMIDATE) injection 13.42 mg (13.42 mg Intravenous Given 06/14/16 1034)  amiodarone (NEXTERONE) 1.8 mg/mL load via infusion 150 mg (150 mg Intravenous Bolus from Bag 06/14/16 1213)  magnesium sulfate IVPB 2 g 50 mL (0 g Intravenous Duplicate 8/58/85 0277)  potassium chloride SA (K-DUR,KLOR-CON) CR tablet 40 mEq (40 mEq Oral Given 06/14/16 1427)     Initial Impression / Assessment and Plan / ED Course  I have reviewed the triage vital signs and the nursing notes.  Pertinent labs & imaging results that were available during my care of the patient were reviewed by me and considered in my medical decision making (see chart for details).      Final Clinical Impressions(s) / ED Diagnoses   Final diagnoses:  Chest pain  Atrial fibrillation with rapid ventricular response ([place])    Labs: I stat trop- bmp-cbc  Imaging: DG chest- EKG   Consults: Cardiology  Therapeutics: Lopressor, morphine, metoprolol, aspirin, nitroglycerin  Discharge Meds:   Assessment/Plan:  61 YOF resents today with complaints of chest pain and palpitations.  Acute onset likely secondary to A-FIb. Pt has a history of this in the past. Currently taking Pradaxa, no missed doses. No elevation in troponin. No severe chest pain, low suspicion for dissection. Pt given nitro, lopressor, and ASA here which improved her symptoms slightly.  She remained tachycardic with reassuring blood pressure.  Cardiology was consulted who recommended cardioversion  here in the ED.  Patient is well-appearing with reassuring vital signs and appropriately anticoagulated.  She has recent echo approximately 2 months ago with no significant findings.  Pt and husband agree that cardioversion would be indicated and consent was given.  Cardiology present for cardioversion.  This was unsuccessful.  She will be admitted to the hospital.   New Prescriptions Current Discharge Medication List       Okey Regal, PA-C 06/14/16 [place], MD 06/18/16 [phone]

## 2016-06-14 NOTE — Sedation Documentation (Addendum)
First shock 120

## 2016-06-14 NOTE — Progress Notes (Signed)
  Amiodarone Drug - Drug Interaction Consult Note  Recommendations: Monitor for muscle pain/weakness on atorvastatin. Monitor for bradycardia, AV block/myocardial depression on Lopressor, diltiazem. Monitor for hypokalemia on Lasix. Monitor for hypoglycemia closely while on glipizide, metformin.  Amiodarone is metabolized by the cytochrome P450 system and therefore has the potential to cause many drug interactions. Amiodarone has an average plasma half-life of 50 days (range 20 to 100 days).   There is potential for drug interactions to occur several weeks or months after stopping treatment and the onset of drug interactions may be slow after initiating amiodarone.   [x]  Statins: Increased risk of myopathy. Simvastatin- restrict dose to 20mg  daily. Other statins: counsel patients to report any muscle pain or weakness immediately. Atorvastatin  []  Anticoagulants: Amiodarone can increase anticoagulant effect. Consider warfarin dose reduction. Patients should be monitored closely and the dose of anticoagulant altered accordingly, remembering that amiodarone levels take several weeks to stabilize.  []  Antiepileptics: Amiodarone can increase plasma concentration of phenytoin, the dose should be reduced. Note that small changes in phenytoin dose can result in large changes in levels. Monitor patient and counsel on signs of toxicity.  [x]  Beta blockers: increased risk of bradycardia, AV block and myocardial depression. Sotalol - avoid concomitant use. Lopressor  [x]   Calcium channel blockers (diltiazem and verapamil): increased risk of bradycardia, AV block and myocardial depression. Diltiazem  []   Cyclosporine: Amiodarone increases levels of cyclosporine. Reduced dose of cyclosporine is recommended.  []  Digoxin dose should be halved when amiodarone is started.  [x]  Diuretics: increased risk of cardiotoxicity if hypokalemia occurs. Lasix  [x]  Oral hypoglycemic agents (glyburide, glipizide,  glimepiride): increased risk of hypoglycemia. Patient's glucose levels should be monitored closely when initiating amiodarone therapy.  Glipizide, Metformin  []  Drugs that prolong the QT interval:  Torsades de pointes risk may be increased with concurrent use - avoid if possible.  Monitor QTc, also keep magnesium/potassium WNL if concurrent therapy can't be avoided. Marland Kitchen Antibiotics: e.g. fluoroquinolones, erythromycin. . Antiarrhythmics: e.g. quinidine, procainamide, disopyramide, sotalol. . Antipsychotics: e.g. phenothiazines, haloperidol.  . Lithium, tricyclic antidepressants, and methadone. Thank You,   Elicia Lamp, PharmD, BCPS Clinical Pharmacist 06/14/2016 1:32 PM

## 2016-06-14 NOTE — ED Notes (Signed)
Signed consent forms for sedation and cardioversion at bedside.

## 2016-06-14 NOTE — Consult Note (Signed)
ELECTROPHYSIOLOGY CONSULT NOTE    Patient ID: Brittany Buckley MRN: [id number], DOB/AGE: 1955/07/26 61 y.o.  Admit date: 06/14/2016 Date of Consult: 06/14/2016   Primary Physician: Marton Redwood, MD Primary Cardiologist: Dr. Acie Fredrickson  Reason for Consultation: AFib  HPI: Brittany Buckley is a 61 y.o. female who is being seen today for the evaluation of rapid AFib at the request of Dr. Sallyanne Kuster.  PMHx noted for HTN, HLD, CAD (PCI done 2-3 years ago), DM, and AFib, she presented to [facility] ER with sudden onset of tachy-palpitations/CP, nausea, found in rapid AF, initially treated with a dose of IV lopressor, her BP relatively low and given reported compliance with her pradaxa without missed doses for the last month attempts at DCCV were made, despite 3 shocks, 120 > 150 > 200J, she remained in rapid AF and an amiodarone gtt was started.  EP service is being called to evaluate further.  At this time at rest she states she feels wiped out, but no CP or rest SOB.  No hx of syncope.  LABS: K+ 3.8 BUN/Creat 15/0.86 Mag 1.5 WBC 8.6 H/H 14/42 Plts 182  TSH ordered/pending   Past Medical History:  Diagnosis Date  . A-fib ([place])   . ADD (attention deficit disorder)   . Coronary artery disease   . Diabetes mellitus without complication ([place])   . Hyperlipidemia   . Hypertension   . MI (myocardial infarction) ([place])   . Numbness of toes   . Obesity   . On anticoagulant therapy   . Peripheral neuropathy   . Urticaria      Surgical History:  Past Surgical History:  Procedure Laterality Date  . FINGER SURGERY    . PERCUTANEOUS CORONARY STENT INTERVENTION (PCI-S)  2014 & 2015   x2   . TONSILLECTOMY       Prescriptions Prior to Admission  Medication Sig Dispense Refill Last Dose  . albuterol (PROVENTIL HFA;VENTOLIN HFA) 108 (90 Base) MCG/ACT inhaler Inhale 1-2 puffs into the lungs every 6 (six) hours as needed for wheezing or shortness of breath.   rescue at rescue  . atorvastatin  (LIPITOR) 80 MG tablet Take 80 mg by mouth daily.   06/14/2016 at Unknown time  . dabigatran (PRADAXA) 150 MG CAPS capsule Take 150 mg by mouth 2 (two) times daily.   06/14/2016 at Unknown time  . furosemide (LASIX) 40 MG tablet Take 40 mg by mouth daily.   06/14/2016 at Unknown time  . glipiZIDE (GLUCOTROL) 5 MG tablet Take by mouth daily before breakfast.   06/14/2016 at Unknown time  . hydrALAZINE (APRESOLINE) 50 MG tablet Take 50 mg by mouth 3 (three) times daily.   06/14/2016 at Unknown time  . lisinopril (PRINIVIL,ZESTRIL) 40 MG tablet Take 40 mg by mouth daily.   06/14/2016 at Unknown time  . metFORMIN (GLUCOPHAGE) 1000 MG tablet Take 1,000 mg by mouth 2 (two) times daily with a meal.   06/14/2016 at Unknown time  . metoprolol (LOPRESSOR) 50 MG tablet Take 50 mg by mouth 2 (two) times daily.   06/14/2016 at Unknown time  . nitroGLYCERIN (NITROSTAT) 0.4 MG SL tablet Place 0.4 mg under the tongue every 5 (five) minutes as needed for chest pain.   06/14/2016 at unk    Inpatient Medications:  . amiodarone  150 mg Intravenous Once  . [START ON 06/15/2016] atorvastatin  80 mg Oral Daily  . dabigatran  150 mg Oral BID  . [START ON 06/15/2016] furosemide  40 mg Oral Daily  . [  START ON 06/15/2016] glipiZIDE  5 mg Oral QAC breakfast  . hydrALAZINE  50 mg Oral TID  . [START ON 06/15/2016] lisinopril  40 mg Oral Daily  . metFORMIN  1,000 mg Oral BID WC  . metoprolol  2.5 mg Intravenous Once  . metoprolol tartrate  75 mg Oral BID  . sodium chloride flush  3 mL Intravenous Q12H    Allergies:  Allergies  Allergen Reactions  . Codeine Hives    ONLY IN COUGH SYRUP  . Demerol [Meperidine] Rash  . Morphine And Related Rash    Social History   Social History  . Marital status: Married    Spouse name: N/A  . Number of children: N/A  . Years of education: N/A   Occupational History  . Not on file.   Social History Main Topics  . Smoking status: Never Smoker  . Smokeless tobacco: Never Used  .  Alcohol use Yes     Comment: 1-2 a week  . Drug use: No  . Sexual activity: Yes     Comment: married   Other Topics Concern  . Not on file   Social History Narrative  . No narrative on file     Family History  Problem Relation Age of Onset  . Hyperlipidemia Mother   . CAD Father   . Heart attack Father   . Cancer - Ovarian Sister   . Diabetes Maternal Grandmother     blindness  . Heart disease Paternal Grandmother   . Diabetes Paternal Grandfather     with amputation  . Healthy Son   . Obesity Daughter      Review of Systems: All other systems reviewed and are otherwise negative except as noted above.  Physical Exam: Vitals:   06/14/16 1041 06/14/16 1046 06/14/16 1100 06/14/16 1130  BP: (!) 135/99 117/66 135/73 140/74  Pulse: (!) 132 (!) 132 82 70  Resp: (!) 26 (!) 21 19 19   Temp:      TempSrc:      SpO2: 99% 99% 99% 99%  Weight:      Height:        GEN- The patient is well appearing, alert and oriented x 3 today.   HEENT: normocephalic, atraumatic; sclera clear, conjunctiva pink; hearing intact; oropharynx clear; neck supple, no JVP Lymph- no cervical lymphadenopathy Lungs- CTA b/l, normal work of breathing.  No wheezes, rales, rhonchi Heart- IRRR, tachycardic, 1/6 SM at base, rubs or gallops, PMI not laterally displaced GI- soft, non-tender, non-distended Extremities- no clubbing, cyanosis, no edema MS- no significant deformity or atrophy Skin- warm and dry, no rash or lesion Psych- euthymic mood, full affect Neuro- no gross deficits observed  Labs:   Lab Results  Component Value Date   WBC 8.6 06/14/2016   HGB 14.1 06/14/2016   HCT 42.3 06/14/2016   MCV 83.3 06/14/2016   PLT 182 06/14/2016    Recent Labs Lab 06/14/16 0829  NA 141  K 3.8  CL 102  CO2 24  BUN 15  CREATININE 0.86  CALCIUM 8.9  GLUCOSE 196*      Radiology/Studies:  Dg Chest Port 1 View Result Date: 06/14/2016 CLINICAL DATA:  Onset of mid chest pain this morning while  showering associated with nausea, dizziness, and shortness of breath. History of atrial fibrillation, previous MI, and coronary stent placement. EXAM: PORTABLE CHEST 1 VIEW COMPARISON:  None in PACs FINDINGS: The lungs are well-expanded. There is no focal infiltrate. There is no pleural effusion, pneumothorax,  or pneumomediastinum. The heart and pulmonary vascularity are normal. The mediastinum is normal in width. There is calcification in the wall of the aortic arch. The bony thorax exhibits no acute abnormality. IMPRESSION: There is no CHF, pneumonia, nor other acute cardiopulmonary abnormality. Thoracic aortic atherosclerosis. Electronically Signed   By: David  Martinique M.D.   On: 06/14/2016 09:55    Reviewed by myself: EKG: AFib, 140bpm, 20ms TELEMETRY: AFib, currently 120's  04/04/16: TTE Study Conclusions - Left ventricle: The cavity size was normal. There was mild focal   basal hypertrophy of the septum. Systolic function was normal.   The estimated ejection fraction was in the range of 60% to 65%.   Wall motion was normal; there were no regional wall motion   abnormalities. Doppler parameters are consistent with abnormal   left ventricular relaxation (grade 1 diastolic dysfunction). - Aortic valve: Trileaflet; mildly thickened, mildly calcified   leaflets. - Left atrium: The atrium was mildly dilated. Volume/bsa, ES,   (1-plane Simpson&'s, A2C): 38.1 ml/m^2.  Assessment and Plan:   1. Symptomatic rapid AF      CHA2DS2Vasc is 4, on Pradaxa and patient has eported compliance for the last month or longer without missed doses      We have stopped her amiodarone gtt with plans for Tikosyn      Dr. Curt Bears discussed 3 day hospital stay and risks/potential pro-arrhythmia, benefits of the medicine.      She would like to pursue Tikosyn      Prior EKG in SR, and today's reviewed by Dr. Curt Bears, QTc acceptable to start      I have ordered K+ 3.8 and mag 1.5 replacement in preparation      I  have reached out to Pharmacy given she did get some IV amiodarone to advise on timing to get started  2. CAD     No active CP, she has not been having any anginal c/o or exertional intolerances until she went into AF today     poc Trop 0.00  3. HTN     Relative hypotension in ER, better now   Signed, Tommye Standard, PA-C 06/14/2016 12:11 PM    I have seen and examined this patient with Tommye Standard.  Agree with above, note added to reflect my findings.  On exam, iRRR, 2/6 systolic murmur at the base, lungs clear. Presented to the hospital with chset pain, found to be in rapid AF with mild hypotension. Attempt at cardioversion failed and as started on IV amiodarone. Rate control and BP improved. Discussed further options of ablation vs medical management with Tikosyn. Patient would prefer a medical option. Kyli Sorter plan for tikosyn loading with cardioversion after the 4th dose. Cezar Misiaszek put her on diltiazem ggt to help control her rate in AF.    Masiyah Jorstad M. Brynleigh Sequeira MD 06/14/2016 1:19 PM

## 2016-06-14 NOTE — Procedures (Signed)
Procedure: Electrical Cardioversion Indications:  Atrial Fibrillation  Procedure Details:  Consent: Risks of procedure as well as the alternatives and risks of each were explained to the (patient/caregiver).  Consent for procedure obtained.  Time Out: Verified patient identification, verified procedure, site/side was marked, verified correct patient position, special equipment/implants available, medications/allergies/relevent history reviewed, required imaging and test results available.  Performed  Patient placed on cardiac monitor, pulse oximetry, supplemental oxygen as necessary.  Sedation given: Etomidate 4 mg IV Dr. Johnney Killian Pacer pads placed anterior and posterior chest.  Cardioverted 3 time(s).  Cardioversion with synchronized biphasic 120J, 150J, 200J shock with pressure held on anterior pad..  Evaluation: Findings: Post procedure EKG shows: Atrial Fibrillation Complications: None Patient did tolerate procedure well.  Admit for rate control.  Time Spent Directly with the Patient:  30 minutes   Brittany Buckley 06/14/2016, 10:53 AM

## 2016-06-14 NOTE — Progress Notes (Signed)
The patient has converted to SR, 60's-70's In discussion with RPH, given shorted 1/2 life of IV amiodarone and small amount received, would be OK to start [place] once electrolytes are corrected.  EKG done in SR today (post amiodarone discontinuation) is reviewed with Dr. Curt Bears, QTc is acceptable to start  Will plan to start Tikosyn 578mcg BID tonight, (Calc Creat Clearance is 98), pending her repeat Mag and K+ levels this afternoon.  Tommye Standard, PA-C

## 2016-06-14 NOTE — ED Notes (Signed)
Cardiology at bedside.

## 2016-06-14 NOTE — H&P (Signed)
Chief Complaint:  Chest pain, Afib  Cardiologist: Nahser  HPI:  This is a 61 y.o. female with a past medical history significant for infrequent [place] paroxysmal atrial fibrillation, on chronic anticoagulation with pradaxa, CAD, hyperlipidemia, hypertension, type 2 diabetes mellitus, obesity, presents with very abrupt onset of chest pain during minimal activity and simultaneous onset of palpitations and nausea. She is found to be in atrial fibrillation with rapid ventricular response.  At this point she denies chest pain while at rest, lying in bed. She is not particularly short of breath. Her ECG shows rate related changes with no evidence of acute ischemia.  She has received a few doses of intravenous beta blocker but ventricular rate remains fast in the 638G and her systolic blood pressure is now relatively low in the 98-107 range. He does not have a history of stroke or TIA. She reports good compliance with anticoagulation over the preceding month. She denies bleeding complications.  Cardioversion was attempted following etomidate for sedation. 3 shocks were delivered at 120 J, 150 J, 200 J without rhythm conversion.  She has normal left ventricular systolic function by a very recent echo performed in February 2018. That same study shows a mildly dilated left atrium with an end-systolic diameter 43 mm, aortic valve sclerosis and basal septal LVH. She reports history of percutaneous coronary revascularization most recently in 2015 while living in [place]. She had a previous episode of paroxysmal atrial fibrillation in 2016 in [place]. I believe this resolved spontaneously.  Her initial cardiac enzymes, performed within 2 hours of symptom onset, are normal. Her chest x-ray shows atherosclerosis of the thoracic aorta but no evidence of heart failure  PMHx:  Past Medical History:  Diagnosis Date  . A-fib ([place])   . ADD (attention deficit disorder)   . Coronary artery  disease   . Diabetes mellitus without complication ([place])   . Hyperlipidemia   . Hypertension   . MI (myocardial infarction) ([place])   . Numbness of toes   . Obesity   . On anticoagulant therapy   . Peripheral neuropathy   . Urticaria     Past Surgical History:  Procedure Laterality Date  . FINGER SURGERY    . PERCUTANEOUS CORONARY STENT INTERVENTION (PCI-S)  2014 & 2015   x2   . TONSILLECTOMY      FAMHx:  Family History  Problem Relation Age of Onset  . Hyperlipidemia Mother   . CAD Father   . Heart attack Father   . Cancer - Ovarian Sister   . Diabetes Maternal Grandmother     blindness  . Heart disease Paternal Grandmother   . Diabetes Paternal Grandfather     with amputation  . Healthy Son   . Obesity Daughter     SOCHx:   reports that she has never smoked. She has never used smokeless tobacco. She reports that she drinks alcohol. She reports that she does not use drugs.  ALLERGIES:  Allergies  Allergen Reactions  . Codeine Hives    ONLY IN COUGH SYRUP  . Demerol [Meperidine] Rash    ROS: Pertinent items noted in HPI and remainder of comprehensive ROS otherwise negative.  HOME MEDS:  (Not in a hospital admission)  LABS/IMAGING: Results for orders placed or performed during the hospital encounter of 06/14/16 (from the past 48 hour(s))  Basic metabolic panel     Status: Abnormal   Collection Time: 06/14/16  8:29 AM  Result Value Ref Range   Sodium  141 135 - 145 mmol/L   Potassium 3.8 3.5 - 5.1 mmol/L   Chloride 102 101 - 111 mmol/L   CO2 24 22 - 32 mmol/L   Glucose, Bld 196 (H) 65 - 99 mg/dL   BUN 15 6 - 20 mg/dL   Creatinine, Ser 0.86 0.44 - 1.00 mg/dL   Calcium 8.9 8.9 - 10.3 mg/dL   GFR calc non Af Amer >60 >60 mL/min   GFR calc Af Amer >60 >60 mL/min    Comment: (NOTE) The eGFR has been calculated using the CKD EPI equation. This calculation has not been validated in all clinical situations. eGFR's persistently <60 mL/min signify possible  Chronic Kidney Disease.    Anion gap 15 5 - 15  CBC     Status: None   Collection Time: 06/14/16  8:29 AM  Result Value Ref Range   WBC 8.6 4.0 - 10.5 K/uL   RBC 5.08 3.87 - 5.11 MIL/uL   Hemoglobin 14.1 12.0 - 15.0 g/dL   HCT 42.3 36.0 - 46.0 %   MCV 83.3 78.0 - 100.0 fL   MCH 27.8 26.0 - 34.0 pg   MCHC 33.3 30.0 - 36.0 g/dL   RDW 14.0 11.5 - 15.5 %   Platelets 182 150 - 400 K/uL  I-stat troponin, ED     Status: None   Collection Time: 06/14/16  8:31 AM  Result Value Ref Range   Troponin i, poc 0.00 0.00 - 0.08 ng/mL   Comment 3            Comment: Due to the release kinetics of cTnI, a negative result within the first hours of the onset of symptoms does not rule out myocardial infarction with certainty. If myocardial infarction is still suspected, repeat the test at appropriate intervals.    Dg Chest Port 1 View  Result Date: 06/14/2016 CLINICAL DATA:  Onset of mid chest pain this morning while showering associated with nausea, dizziness, and shortness of breath. History of atrial fibrillation, previous MI, and coronary stent placement. EXAM: PORTABLE CHEST 1 VIEW COMPARISON:  None in PACs FINDINGS: The lungs are well-expanded. There is no focal infiltrate. There is no pleural effusion, pneumothorax, or pneumomediastinum. The heart and pulmonary vascularity are normal. The mediastinum is normal in width. There is calcification in the wall of the aortic arch. The bony thorax exhibits no acute abnormality. IMPRESSION: There is no CHF, pneumonia, nor other acute cardiopulmonary abnormality. Thoracic aortic atherosclerosis. Electronically Signed   By: David  Martinique M.D.   On: 06/14/2016 09:55    VITALS: Blood pressure 117/66, pulse (!) 132, temperature 99 F (37.2 C), temperature source Oral, resp. rate (!) 21, height '5\' 8"'$  (1.727 m), weight 89.4 kg (197 lb), SpO2 99 %.  EXAM:  General: Alert, oriented x3, no distress Head: no evidence of trauma, PERRL, EOMI, no exophtalmos or  lid lag, no myxedema, no xanthelasma; normal ears, nose and oropharynx Neck: Normal jugular venous pulsations and no hepatojugular reflux; brisk carotid pulses without delay and no carotid bruits Chest: clear to auscultation, no signs of consolidation by percussion or palpation, normal fremitus, symmetrical and full respiratory excursions Cardiovascular: normal position and quality of the apical impulse, rapid iregular rhythm, normal first heart sound and normal second heart sounds, no rubs or gallops, 2/6 early peaking systolic ejection murmur, no diastolic murmur Abdomen: no tenderness or distention, no masses by palpation, no abnormal pulsatility or arterial bruits, normal bowel sounds, no hepatosplenomegaly Extremities: no clubbing, cyanosis or edema; 2+  radial, ulnar and brachial pulses bilaterally; 2+ right femoral, posterior tibial and dorsalis pedis pulses; 2+ left femoral, posterior tibial and dorsalis pedis pulses; no subclavian or femoral bruits Neurological: grossly nonfocal   IMPRESSION: 1. Symptomatic atrial fibrillation with rapid ventricular response, unsuccessful attempt at cardioversion 2. CAD with angina likely precipitated by tachycardia, need to exclude acute coronary syndrome 3. Systemic hypertension 4. Type 2 diabetes mellitus 5. Hyperlipidemia 6. Obesity  PLAN: Admit for ventricular rate control. Due to relatively low blood pressure will use intravenous amiodarone. Gently increase maintenance dose of beta blocker. Consider repeat attempt at cardioversion after 24 hours of intravenous amiodarone. EP consultation, discuss radiofrequency ablation as long-term management strategy. Tikosyn another option, but need amio right now for rate control. Continue anticoagulation. Pradaxa-amiodarone interaction noted, may need to switch to different novel anticoagulant if this medication will be used long-term, but suspect other long term antiarrhythmic options will be tried  first.  Sanda Klein, MD, [facility] [phone] office [phone] pager  06/14/2016, 11:03 AM

## 2016-06-14 NOTE — Sedation Documentation (Addendum)
Second shock 150

## 2016-06-15 LAB — HEMOGLOBIN A1C
HEMOGLOBIN A1C: 6.8 % — AB (ref 4.8–5.6)
Mean Plasma Glucose: 148 mg/dL

## 2016-06-15 LAB — BASIC METABOLIC PANEL
Anion gap: 8 (ref 5–15)
BUN: 18 mg/dL (ref 6–20)
CHLORIDE: 108 mmol/L (ref 101–111)
CO2: 21 mmol/L — ABNORMAL LOW (ref 22–32)
CREATININE: 0.75 mg/dL (ref 0.44–1.00)
Calcium: 8.1 mg/dL — ABNORMAL LOW (ref 8.9–10.3)
GFR calc Af Amer: >60 mL/min (ref >60)
GFR calc non Af Amer: >60 mL/min (ref >60)
GLUCOSE: 96 mg/dL (ref 65–99)
POTASSIUM: 4.7 mmol/L (ref 3.5–5.1)
Sodium: 137 mmol/L (ref 135–145)

## 2016-06-15 LAB — MAGNESIUM: Magnesium: 1.9 mg/dL (ref 1.7–2.4)

## 2016-06-15 MED ORDER — MAGNESIUM SULFATE IN D5W 1-5 GM/100ML-% IV SOLN
1.0000 g | Freq: Once | INTRAVENOUS | Status: AC
  Administered 2016-06-15: 1 g via INTRAVENOUS
  Filled 2016-06-15: qty 100

## 2016-06-15 MED ORDER — HYDROCORTISONE 1 % EX CREA
TOPICAL_CREAM | CUTANEOUS | Status: DC | PRN
  Administered 2016-06-15: 1 via TOPICAL
  Filled 2016-06-15: qty 28

## 2016-06-15 MED ORDER — DOFETILIDE 250 MCG PO CAPS
375.0000 ug | ORAL_CAPSULE | Freq: Two times a day (BID) | ORAL | Status: DC
  Administered 2016-06-15 (×2): 375 ug via ORAL
  Filled 2016-06-15 (×2): qty 1

## 2016-06-15 NOTE — Progress Notes (Signed)
Gave first Tikosyn around 22:00. EKG three hours later had QTc 597, which was 452 earlier.  The on-call cardiologist was informed resulting in discontinuing the Tikosyn and an order for another EKG in the AM.  Will continue to monitor pt.  Lupita Dawn, RN

## 2016-06-15 NOTE — Progress Notes (Signed)
Inpatient Diabetes Program Recommendations  AACE/ADA: New Consensus Statement on Inpatient Glycemic Control (2015)  Target Ranges:  Prepandial:   less than 140 mg/dL      Peak postprandial:   less than 180 mg/dL (1-2 hours)      Critically ill patients:  140 - 180 mg/dL   Results for Brittany Buckley, Brittany Buckley (MRN [id number]) as of 06/15/2016 12:16  Ref. Range 06/14/2016 08:29 06/14/2016 20:53 06/15/2016 02:38  Glucose Latest Ref Range: 65 - 99 mg/dL 196 (H) 74 96   Review of Glycemic Control  Diabetes history: DM2 Outpatient Diabetes medications: Glipizide 5 mg QAM, Metformin 1000 mg BID Current orders for Inpatient glycemic control:Glipizide 5 mg QAM, Metformin 1000 mg BID   Inpatient Diabetes Program Recommendations: Correction (SSI): Please consider ordering CBGs with Novolog 0-9 units TID with meals and Novolog 0-5 units QHS. Oral Agents: While inpatient, please discontinue oral DM medications.  Thanks, Barnie Alderman, RN, MSN, CDE Diabetes Coordinator Inpatient Diabetes Program [phone] (Team Pager from 8am to 5pm)

## 2016-06-15 NOTE — Progress Notes (Signed)
   06/15/16 0950  Clinical Encounter Type  Visited With Patient  Visit Type Other (Comment) ([place] consult)  Spiritual Encounters  Spiritual Needs Prayer;Literature  Stress Factors  Patient Stress Factors Health changes  Introduction to Pt. Offered prayer of comfort and peace. Provided Bible as requested by Pt.

## 2016-06-15 NOTE — Progress Notes (Signed)
SUBJECTIVE: The patient is doing well today.  At this time, she reports feeling much better, she denies chest pain, shortness of breath, or any new concerns.  Marland Kitchen atorvastatin  80 mg Oral Daily  . dabigatran  150 mg Oral BID  . diltiazem  120 mg Oral Daily  . dofetilide  375 mcg Oral BID  . furosemide  40 mg Oral Daily  . glipiZIDE  5 mg Oral QAC breakfast  . hydrALAZINE  50 mg Oral TID  . lisinopril  40 mg Oral Daily  . metFORMIN  1,000 mg Oral BID WC  . metoprolol  2.5 mg Intravenous Once  . metoprolol tartrate  75 mg Oral BID  . sodium chloride flush  3 mL Intravenous Q12H  . sodium chloride flush  3 mL Intravenous Q12H   . sodium chloride    . sodium chloride      OBJECTIVE: Physical Exam: Vitals:   06/14/16 1406 06/14/16 2017 06/14/16 2201 06/15/16 0500  BP: 133/78 126/62 (!) 116/55 (!) 145/57  Pulse: 70 67 63 (!) 52  Resp: 19 18    Temp: 98.7 F (37.1 C) 98.3 F (36.8 C)  98.1 F (36.7 C)  TempSrc: Oral Oral  Oral  SpO2: 97% 97%  97%  Weight:    203 lb 4.8 oz (92.2 kg)  Height: 5\' 8"  (1.727 m)       Intake/Output Summary (Last 24 hours) at 06/15/16 0804 Last data filed at 06/14/16 1800  Gross per 24 hour  Intake           348.83 ml  Output                0 ml  Net           348.83 ml    Telemetry reviewed by myself and Dr. Curt Bears, SB/SR 50's-60's, one NSVT 6 beats  GEN- The patient is well appearing, alert and oriented x 3 today.   Head- normocephalic, atraumatic Eyes-  Sclera clear, conjunctiva pink Ears- hearing intact Oropharynx- clear Neck- supple, no JVP Lungs- CTA b/l, normal work of breathing Heart- RRR, no significant murmurs, no rubs or gallops GI- soft, NT, ND Extremities- no clubbing, cyanosis, or edema Skin- no rash or lesion Psych- euthymic mood, full affect Neuro- no gross deficits appreciated  LABS: Basic Metabolic Panel:  Recent Labs  06/14/16 1621 06/14/16 2053 06/15/16 0238  NA  --  138 137  K 3.7 4.0 4.7  CL  --   107 108  CO2  --  22 21*  GLUCOSE  --  74 96  BUN  --  17 18  CREATININE  --  0.83 0.75  CALCIUM  --  8.1* 8.1*  MG 2.3  --  1.9   CBC:  Recent Labs  06/14/16 0829  WBC 8.6  HGB 14.1  HCT 42.3  MCV 83.3  PLT 182   Thyroid Function Tests:  Recent Labs  06/14/16 1214  TSH 0.923      ASSESSMENT AND PLAN:  1. Symptomatic rapid AF      CHA2DS2Vasc is 4, on Pradaxa and patient has eported compliance for the last month or longer without missed doses       EKGs reviewed with Dr. Curt Bears, QT prolongation with her first dose noted       Ermon Sagan reduce dose and monitor carefully       K+ 4.7       Mag 1.9, Alda Gaultney give a dose  Creat stable 0.75   2. CAD     No active CP, she has not been having any anginal c/o or exertional intolerances until she went into AF today     poc Trop 0.00   3. HTN     stable, no changes  Tommye Standard, PA-C 06/15/2016 8:04 AM  I have seen and examined this patient with Tommye Standard.  Agree with above, note added to reflect my findings.  On exam, RRR, no murmurs, lungs clear. Had first Tikosyn dose last night with prolongation of QTc. Sheriece Jefcoat plan to decrease her AM dose to 375 mcg. With her longer QTc may not tolerate tikosyn and would be a good ablation candidate down the road. Did have a short run of NSVT 5 beats which were monomorphic. Caelynn Marshman continue to monitor.    Nikiya Starn M. Besan Ketchem MD 06/15/2016 8:35 AM

## 2016-06-15 NOTE — Progress Notes (Signed)
Per insurance check on Tikosyn  S/W [place] @ [facility] # [phone]   KGSUPJS 315 MCG - NO   1. TIKOSYN 125 MCG , 250 MCG  AND  500 MCG  COVER- YES         YES            YES  CO-PAY- $ 35.00       SAME           SAME  TIER- 3 DRUG         SAME           SAME  PRIOR APPROVAL- NO  NO             NO   2. DOFETILIDE  125 MCG  , 250 MCG  AND 500 MCG  COVER- YES             YES           YES  CO-PAY $ 10.00          SAME          SAME  TIER- 3 DRUG            SAME          SAME  PRIOR APPROVAL- NO     NO            NO   PHARMACY : [facility]

## 2016-06-16 LAB — BASIC METABOLIC PANEL
ANION GAP: 7 (ref 5–15)
BUN: 12 mg/dL (ref 6–20)
CHLORIDE: 109 mmol/L (ref 101–111)
CO2: 23 mmol/L (ref 22–32)
Calcium: 8.4 mg/dL — ABNORMAL LOW (ref 8.9–10.3)
Creatinine, Ser: 0.68 mg/dL (ref 0.44–1.00)
GFR calc non Af Amer: >60 mL/min (ref >60)
GLUCOSE: 102 mg/dL — AB (ref 65–99)
POTASSIUM: 4.1 mmol/L (ref 3.5–5.1)
Sodium: 139 mmol/L (ref 135–145)

## 2016-06-16 LAB — MAGNESIUM: Magnesium: 1.9 mg/dL (ref 1.7–2.4)

## 2016-06-16 MED ORDER — MAGNESIUM OXIDE 400 (241.3 MG) MG PO TABS
400.0000 mg | ORAL_TABLET | Freq: Every day | ORAL | Status: DC
  Administered 2016-06-16: 400 mg via ORAL
  Filled 2016-06-16: qty 1

## 2016-06-16 MED ORDER — DOFETILIDE 125 MCG PO CAPS
125.0000 ug | ORAL_CAPSULE | Freq: Two times a day (BID) | ORAL | Status: DC
  Administered 2016-06-16: 125 ug via ORAL
  Filled 2016-06-16: qty 1

## 2016-06-16 MED ORDER — DIPHENHYDRAMINE HCL 25 MG PO CAPS
25.0000 mg | ORAL_CAPSULE | Freq: Four times a day (QID) | ORAL | Status: DC | PRN
  Administered 2016-06-16 – 2016-06-17 (×3): 25 mg via ORAL
  Filled 2016-06-16 (×3): qty 1

## 2016-06-16 MED ORDER — DOFETILIDE 250 MCG PO CAPS
250.0000 ug | ORAL_CAPSULE | Freq: Two times a day (BID) | ORAL | Status: DC
  Administered 2016-06-16: 250 ug via ORAL
  Filled 2016-06-16: qty 1

## 2016-06-16 NOTE — Care Management Note (Signed)
Case Management Note Marvetta Gibbons RN, BSN Unit 2W-Case Manager [phone]  Patient Details  Name: Brittany Buckley MRN: [id number] Date of Birth: [birth year]  Subjective/Objective:  Pt admitted with afib- for Tikosyn load                  Action/Plan: PTA pt lived at home with spouse- independent- referral for Tikosyn needs- per insurance check copay $35 for brand- $10 for generic- spoke with pt at bedside- pt is not Medicare yet and still works would be eligible to use the Tikosyn copay assist card for $4 copay- pt would like to do this- and would need to have script for Brand name Tikosyn- per pt she uses [facility] which can order drug in stock once script received- pt will need 7 day supply from [employer] on day of discharge- MD to provide script to send down to fill- Cm will assist pt with copay card for Tikosyn prior to discharge.   Expected Discharge Date:     06/17/16             Expected Discharge Plan:  Home/Self Care  In-House Referral:     Discharge planning Services  CM Consult, Medication Assistance  Post Acute Care Choice:  NA Choice offered to:  NA  DME Arranged:    DME Agency:     HH Arranged:    HH Agency:     Status of Service:  In process, will continue to follow  If discussed at Long Length of Stay Meetings, dates discussed:    Additional Comments:  Dawayne Patricia, RN 06/16/2016, 5:07 PM

## 2016-06-16 NOTE — Progress Notes (Signed)
SUBJECTIVE: The patient is doing well today.  No chest pain, SOB. Feeling improved in sinus rhythm  . atorvastatin  80 mg Oral Daily  . dabigatran  150 mg Oral BID  . diltiazem  120 mg Oral Daily  . dofetilide  250 mcg Oral BID  . furosemide  40 mg Oral Daily  . glipiZIDE  5 mg Oral QAC breakfast  . hydrALAZINE  50 mg Oral TID  . lisinopril  40 mg Oral Daily  . magnesium oxide  400 mg Oral Daily  . metFORMIN  1,000 mg Oral BID WC  . metoprolol  2.5 mg Intravenous Once  . metoprolol tartrate  75 mg Oral BID  . sodium chloride flush  3 mL Intravenous Q12H  . sodium chloride flush  3 mL Intravenous Q12H   . sodium chloride    . sodium chloride      OBJECTIVE: Physical Exam: Vitals:   06/15/16 1128 06/15/16 1427 06/15/16 2122 06/16/16 0509  BP: (!) 146/64 (!) 139/56 (!) 153/64 (!) 158/66  Pulse:  60 63 62  Resp:  18  18  Temp:  97.4 F (36.3 C)  97.7 F (36.5 C)  TempSrc:  Oral  Oral  SpO2:  98%  98%  Weight:    201 lb 4.8 oz (91.3 kg)  Height:        Intake/Output Summary (Last 24 hours) at 06/16/16 1002 Last data filed at 06/15/16 1126  Gross per 24 hour  Intake                3 ml  Output                0 ml  Net                3 ml    Telemetry: personally reviewed. Sinus rhythm, NSVT of 3 beats  GEN- The patient is well appearing, alert and oriented x 3 today.   Head- normocephalic, atraumatic Eyes-  Sclera clear, conjunctiva pink Ears- hearing intact Oropharynx- clear Neck- supple, no JVP Lungs- CTA b/l, normal work of breathing Heart- RRR, 2/6 systolic murmur at the base, no rubs or gallops GI- soft, NT, ND Extremities- no clubbing, cyanosis, or edema Skin- no rash or lesion Psych- euthymic mood, full affect Neuro- no gross deficits appreciated  LABS: Basic Metabolic Panel:  Recent Labs  06/15/16 0238 06/16/16 0245  NA 137 139  K 4.7 4.1  CL 108 109  CO2 21* 23  GLUCOSE 96 102*  BUN 18 12  CREATININE 0.75 0.68  CALCIUM 8.1* 8.4*    MG 1.9 1.9   CBC:  Recent Labs  06/14/16 0829  WBC 8.6  HGB 14.1  HCT 42.3  MCV 83.3  PLT 182   Thyroid Function Tests:  Recent Labs  06/14/16 1214  TSH 0.923      ASSESSMENT AND PLAN:  1. Symptomatic rapid AF      CHA2DS2Vasc is 4, on Pradaxa and patient has eported compliance for the last month or longer without missed doses       QT prolongation with dose of 375 mcg       Dose reduced to 250 mcg       K+ 4.1       Mag 1.9       Creat stable 0.68  2. CAD     No current chest pain  3. HTN     stable, no changes  Brittany Bougher, MD  06/16/2016 10:02 AM

## 2016-06-17 LAB — BASIC METABOLIC PANEL
ANION GAP: 8 (ref 5–15)
BUN: 15 mg/dL (ref 6–20)
CO2: 25 mmol/L (ref 22–32)
Calcium: 8.8 mg/dL — ABNORMAL LOW (ref 8.9–10.3)
Chloride: 105 mmol/L (ref 101–111)
Creatinine, Ser: 0.76 mg/dL (ref 0.44–1.00)
Glucose, Bld: 91 mg/dL (ref 65–99)
POTASSIUM: 4 mmol/L (ref 3.5–5.1)
Sodium: 138 mmol/L (ref 135–145)

## 2016-06-17 LAB — MAGNESIUM: MAGNESIUM: 1.8 mg/dL (ref 1.7–2.4)

## 2016-06-17 MED ORDER — DRONEDARONE HCL 400 MG PO TABS: 400.0000 mg | ORAL_TABLET | Freq: Two times a day (BID) | ORAL | 6 refills | Status: DC

## 2016-06-17 MED ORDER — HYDROCORTISONE 1 % EX CREA: TOPICAL_CREAM | CUTANEOUS | 0 refills | Status: AC | PRN

## 2016-06-17 MED ORDER — DILTIAZEM HCL ER COATED BEADS 120 MG PO CP24: 120.0000 mg | ORAL_CAPSULE | Freq: Every day | ORAL | 6 refills | Status: DC

## 2016-06-17 NOTE — Progress Notes (Signed)
Per Clinical biochemist for [employer]- # 1. S/W PHILLIP @ [facility] # [id number]   MULTAQ 400 MG BID   COVER- YES  CO-PAY- $ 60.00  TIER- 3 DRUG  PRIOR APPROVAL- NO   PHARMACY : [facility]

## 2016-06-17 NOTE — Progress Notes (Signed)
Order received to discharge patient.  Pt education completed with husband at bedside.  All questions answered.  Handout given on Multaq, Pt to begin Sunday.  Pt stable to discharge.

## 2016-06-17 NOTE — Discharge Summary (Signed)
DISCHARGE SUMMARY    Patient ID: Brittany Buckley,  MRN: [id number], DOB/AGE: 1956-02-03 61 y.o.  Admit date: 06/14/2016 Discharge date: 06/17/2016  Primary Care Physician: Marton Redwood, MD  Primary Cardiologist: Dr. Acie Fredrickson Electrophysiologist: new to Dr. Curt Bears  Primary Discharge Diagnosis:  1. Atrial fibrillation w/RVR 2. Tikosyn initiation      Aborted 2/2 QT prolongation  Secondary Discharge Diagnosis:  1. HTN 2. CAD 3. HLD 4. DM  Allergies  Allergen Reactions  . Codeine Hives    ONLY IN COUGH SYRUP  . Demerol [Meperidine] Rash  . Morphine And Related Rash     Procedures This Admission:  1. 06/14/16 DCCV in ED, Dr. Sallyanne Kuster, 120J>150J>200J, all failed  Brief HPI: Brittany Buckley is a 61 y.o. female was admitted to [facility] 06/14/16 with c.o palpitations, CP found in Rapid AF, initiall y tx with IV BB, her BP somewhat soft and attempts at DCCV, al;l failed and started on amiodarone gtt.  Hospital Course:  The patient has PMHx noted for HTN, HLD, CAD (PCI done 2-3 years ago), DM, and AFib was admitted and EP consulted to aid in rate and/or rhythm control.  It was decided to pursue Tikosyn, so amiodarone was stopped for diltiazem gtt, the patient reported full compliance with her a/c, not missing any doses since started last year.  She did spontaneously convert to SR, after discussion with pharmacy for timing of Tikosyn given short duration of Amio she was started on Tiksoyn, unfortunately she had persistent QT prolongation despite down-titration eventually to the 13mcg dose and discontinued.  Telemetry remained stable, SR/SB, 50's-60's with V ectopy or arrhythmias.  Dr. Curt Bears discussed with the patient, Brittany Buckley plan to start Multaq on Sunday to allow 48 hours of washout of Tikosyn.  We Brittany Buckley keep her AF clinic follow up, as well as f/u with Dr. Curt Bears as planned.  Her EKG today noted her QTc much improved to 431ms.  The patient was examined by Dr. Curt Bears and considered  stable for discharge to home.    Physical Exam: Vitals:   06/16/16 1325 06/16/16 2000 06/17/16 0613 06/17/16 0618  BP: (!) 142/68 (!) 143/64 (!) 151/59   Pulse: 60 60 (!) 58   Resp: 18 18 18    Temp: 98.8 F (37.1 C) 98.4 F (36.9 C) 98.2 F (36.8 C)   TempSrc: Oral Oral Oral   SpO2: 98% 98% 97%   Weight:    199 lb 11.2 oz (90.6 kg)  Height:        GEN- The patient is well appearing, alert and oriented x 3 today.   HEENT: normocephalic, atraumatic; sclera clear, conjunctiva pink; hearing intact; oropharynx clear; neck supple, no JVP Lungs- CTA b/l, normal work of breathing.  No wheezes, rales, rhonchi Heart- RRR, 1/6SM, rubs or gallops, PMI not laterally displaced GI- soft, non-tender, non-distended Extremities- no clubbing, cyanosis, or edema; MS- no significant deformity or atrophy Skin- warm and dry, no rash or lesion, mild skin irritation from DCCV noted Psych- euthymic mood, full affect Neuro- no gross deficits   Labs:   Lab Results  Component Value Date   WBC 8.6 06/14/2016   HGB 14.1 06/14/2016   HCT 42.3 06/14/2016   MCV 83.3 06/14/2016   PLT 182 06/14/2016     Recent Labs Lab 06/17/16 0316  NA 138  K 4.0  CL 105  CO2 25  BUN 15  CREATININE 0.76  CALCIUM 8.8*  GLUCOSE 91    Discharge Medications:  Allergies as  of 06/17/2016      Reactions   Codeine Hives   ONLY IN COUGH SYRUP   Demerol [meperidine] Rash   Morphine And Related Rash      Medication List    TAKE these medications   albuterol 108 (90 Base) MCG/ACT inhaler Commonly known as:  PROVENTIL HFA;VENTOLIN HFA Inhale 1-2 puffs into the lungs every 6 (six) hours as needed for wheezing or shortness of breath.   atorvastatin 80 MG tablet Commonly known as:  LIPITOR Take 80 mg by mouth daily.   dabigatran 150 MG Caps capsule Commonly known as:  PRADAXA Take 150 mg by mouth 2 (two) times daily.   diltiazem 120 MG 24 hr capsule Commonly known as:  CARDIZEM CD Take 1 capsule (120 mg  total) by mouth daily.   dronedarone 400 MG tablet Commonly known as:  MULTAQ Take 1 tablet (400 mg total) by mouth 2 (two) times daily with a meal.   furosemide 40 MG tablet Commonly known as:  LASIX Take 40 mg by mouth daily.   glipiZIDE 5 MG tablet Commonly known as:  GLUCOTROL Take by mouth daily before breakfast.   hydrALAZINE 50 MG tablet Commonly known as:  APRESOLINE Take 50 mg by mouth 3 (three) times daily.   hydrocortisone cream 1 % Apply topically as needed for itching.   lisinopril 40 MG tablet Commonly known as:  PRINIVIL,ZESTRIL Take 40 mg by mouth daily.   metFORMIN 1000 MG tablet Commonly known as:  GLUCOPHAGE Take 1,000 mg by mouth 2 (two) times daily with a meal.   metoprolol 50 MG tablet Commonly known as:  LOPRESSOR Take 50 mg by mouth 2 (two) times daily.   nitroGLYCERIN 0.4 MG SL tablet Commonly known as:  NITROSTAT Place 0.4 mg under the tongue every 5 (five) minutes as needed for chest pain.       Disposition:  Home Discharge Instructions    Diet - low sodium heart healthy    Complete by:  As directed    Increase activity slowly    Complete by:  As directed      Follow-up Information    [facility] Follow up on 06/23/2016.   Specialty:  Cardiology Why:  11:00AM Contact information: [address] [id number] [place] [phone]       [facility] Meredith Leeds, MD Follow up on 07/18/2016.   Specialty:  Cardiology Why:  11:45AM Contact information: [place] [id number] [phone]           Duration of Discharge Encounter: Greater than 30 minutes including physician time.  SignedTommye Standard, PA-C 06/17/2016 10:35 AM  I have seen and examined this patient with Tommye Standard.  Agree with above, note added to reflect my findings.  On exam, RRR, 2/6 murmur at the base, lungs clear. Presented to the hospital with AF. Attempt at cardioversion  without conversion to sinus rhythm. Started on amiodarone and converted. Switched to [employer] but had QT prolongation. Plan for discharge on Multaq with follow up in clinic and possible ablation in the future.    Brittany Buckley M. Anniemae Haberkorn MD 06/17/2016 10:49 AM

## 2016-06-17 NOTE — Care Management Note (Signed)
Case Management Note Marvetta Gibbons RN, BSN Unit 2W-Case Manager [phone]  Patient Details  Name: Marcianna Daily MRN: [id number] Date of Birth: [birth year]  Subjective/Objective:  Pt admitted with afib- for Tikosyn load                  Action/Plan: PTA pt lived at home with spouse- independent- referral for Tikosyn needs- per insurance check copay $35 for brand- $10 for generic- spoke with pt at bedside- pt is not Medicare yet and still works would be eligible to use the Tikosyn copay assist card for $4 copay- pt would like to do this- and would need to have script for Brand name Tikosyn- per pt she uses [facility] which can order drug in stock once script received- pt will need 7 day supply from [employer] on day of discharge- MD to provide script to send down to fill- Cm will assist pt with copay card for Tikosyn prior to discharge.   Expected Discharge Date:  06/17/16  06/17/16             Expected Discharge Plan:  Home/Self Care  In-House Referral:     Discharge planning Services  CM Consult, Medication Assistance  Post Acute Care Choice:  NA Choice offered to:  NA  DME Arranged:    DME Agency:     HH Arranged:    HH Agency:     Status of Service:  Completed, signed off  If discussed at [employer] of Stay Meetings, dates discussed:    Additional Comments:  06/17/16- 1100- Christi Wirick RN, CM- pt for d/c home today- failed Tikosyn- MD will try Multaq- benefits check submitted- copay will be $60/mo- pt is eligible for copay assist card- which is 0 dollars- assisted pt at bedside with copay assist via online- and printed copay card for pt to take with her to her pharmacy today.  Dawayne Patricia, RN 06/17/2016, 11:49 AM

## 2016-06-20 DIAGNOSIS — Z719 Counseling, unspecified: Secondary | ICD-10-CM | POA: Diagnosis not present

## 2016-06-22 ENCOUNTER — Encounter (HOSPITAL_COMMUNITY): Payer: Self-pay | Admitting: Nurse Practitioner

## 2016-06-22 ENCOUNTER — Telehealth: Payer: Self-pay | Admitting: Physician Assistant

## 2016-06-22 ENCOUNTER — Ambulatory Visit (HOSPITAL_COMMUNITY)
Admission: RE | Admit: 2016-06-22 | Discharge: 2016-06-22 | Disposition: A | Discharge status: Home / Self Care | Payer: Commercial Managed Care - PPO | Source: Ambulatory Visit | Attending: Nurse Practitioner | Admitting: Nurse Practitioner

## 2016-06-22 ENCOUNTER — Ambulatory Visit (HOSPITAL_COMMUNITY): Admission: RE | Admit: 2016-06-22 | Payer: Commercial Managed Care - PPO | Source: Ambulatory Visit

## 2016-06-22 VITALS — BP 116/62 | HR 55 | Ht 68.0 in | Wt 203.0 lb

## 2016-06-22 DIAGNOSIS — M19042 Primary osteoarthritis, left hand: Secondary | ICD-10-CM | POA: Insufficient documentation

## 2016-06-22 DIAGNOSIS — Z7984 Long term (current) use of oral hypoglycemic drugs: Secondary | ICD-10-CM | POA: Diagnosis not present

## 2016-06-22 DIAGNOSIS — G8929 Other chronic pain: Secondary | ICD-10-CM | POA: Insufficient documentation

## 2016-06-22 DIAGNOSIS — M542 Cervicalgia: Secondary | ICD-10-CM | POA: Insufficient documentation

## 2016-06-22 DIAGNOSIS — M545 Low back pain: Secondary | ICD-10-CM | POA: Diagnosis not present

## 2016-06-22 DIAGNOSIS — M19072 Primary osteoarthritis, left ankle and foot: Secondary | ICD-10-CM | POA: Insufficient documentation

## 2016-06-22 DIAGNOSIS — I10 Essential (primary) hypertension: Secondary | ICD-10-CM | POA: Insufficient documentation

## 2016-06-22 DIAGNOSIS — F988 Other specified behavioral and emotional disorders with onset usually occurring in childhood and adolescence: Secondary | ICD-10-CM | POA: Diagnosis not present

## 2016-06-22 DIAGNOSIS — G629 Polyneuropathy, unspecified: Secondary | ICD-10-CM | POA: Insufficient documentation

## 2016-06-22 DIAGNOSIS — G43909 Migraine, unspecified, not intractable, without status migrainosus: Secondary | ICD-10-CM | POA: Insufficient documentation

## 2016-06-22 DIAGNOSIS — K219 Gastro-esophageal reflux disease without esophagitis: Secondary | ICD-10-CM | POA: Insufficient documentation

## 2016-06-22 DIAGNOSIS — R1011 Right upper quadrant pain: Secondary | ICD-10-CM | POA: Diagnosis not present

## 2016-06-22 DIAGNOSIS — E669 Obesity, unspecified: Secondary | ICD-10-CM | POA: Insufficient documentation

## 2016-06-22 DIAGNOSIS — I252 Old myocardial infarction: Secondary | ICD-10-CM | POA: Diagnosis not present

## 2016-06-22 DIAGNOSIS — I4891 Unspecified atrial fibrillation: Secondary | ICD-10-CM

## 2016-06-22 DIAGNOSIS — I2510 Atherosclerotic heart disease of native coronary artery without angina pectoris: Secondary | ICD-10-CM | POA: Insufficient documentation

## 2016-06-22 DIAGNOSIS — Z955 Presence of coronary angioplasty implant and graft: Secondary | ICD-10-CM | POA: Diagnosis not present

## 2016-06-22 DIAGNOSIS — Z7901 Long term (current) use of anticoagulants: Secondary | ICD-10-CM | POA: Diagnosis not present

## 2016-06-22 DIAGNOSIS — F419 Anxiety disorder, unspecified: Secondary | ICD-10-CM | POA: Diagnosis not present

## 2016-06-22 DIAGNOSIS — Z8249 Family history of ischemic heart disease and other diseases of the circulatory system: Secondary | ICD-10-CM | POA: Insufficient documentation

## 2016-06-22 DIAGNOSIS — E785 Hyperlipidemia, unspecified: Secondary | ICD-10-CM | POA: Insufficient documentation

## 2016-06-22 DIAGNOSIS — J45909 Unspecified asthma, uncomplicated: Secondary | ICD-10-CM | POA: Diagnosis not present

## 2016-06-22 DIAGNOSIS — M19041 Primary osteoarthritis, right hand: Secondary | ICD-10-CM | POA: Diagnosis not present

## 2016-06-22 DIAGNOSIS — M19071 Primary osteoarthritis, right ankle and foot: Secondary | ICD-10-CM | POA: Diagnosis not present

## 2016-06-22 DIAGNOSIS — E119 Type 2 diabetes mellitus without complications: Secondary | ICD-10-CM | POA: Insufficient documentation

## 2016-06-22 LAB — COMPREHENSIVE METABOLIC PANEL
ALBUMIN: 3.6 g/dL (ref 3.5–5.0)
ALT: 26 U/L (ref 14–54)
AST: 23 U/L (ref 15–41)
Alkaline Phosphatase: 60 U/L (ref 38–126)
Anion gap: 7 (ref 5–15)
BUN: 17 mg/dL (ref 6–20)
CHLORIDE: 108 mmol/L (ref 101–111)
CO2: 25 mmol/L (ref 22–32)
Calcium: 8.9 mg/dL (ref 8.9–10.3)
Creatinine, Ser: 0.97 mg/dL (ref 0.44–1.00)
GFR calc Af Amer: >60 mL/min (ref >60)
GFR calc non Af Amer: >60 mL/min (ref >60)
GLUCOSE: 82 mg/dL (ref 65–99)
POTASSIUM: 4.2 mmol/L (ref 3.5–5.1)
SODIUM: 140 mmol/L (ref 135–145)
Total Bilirubin: 0.4 mg/dL (ref 0.3–1.2)
Total Protein: 6.1 g/dL — ABNORMAL LOW (ref 6.5–8.1)

## 2016-06-22 NOTE — Telephone Encounter (Signed)
New message      Drug interaction for Multaq and Pradaxa , are you aware of this?  Patient wanted pharmacist to call and make sure you monitor patient for bleeding

## 2016-06-23 ENCOUNTER — Other Ambulatory Visit (HOSPITAL_COMMUNITY): Payer: Self-pay | Admitting: *Deleted

## 2016-06-23 ENCOUNTER — Inpatient Hospital Stay (HOSPITAL_COMMUNITY): Admit: 2016-06-23 | Payer: Commercial Managed Care - PPO | Admitting: Nurse Practitioner

## 2016-06-23 NOTE — Telephone Encounter (Signed)
PHARMACIST AWARE THAT MULTAQ AND PRADAXA COMBINATION IS SAFE AND  PT HAS KIDNEY FUNCTION THAT IS STABLE AND A ONE MONTH FOLLOW UP.

## 2016-06-23 NOTE — Progress Notes (Signed)
Primary Care Physician: Marton Redwood, MD Referring Physician: Dr. Curt Bears Cardiologist: Dr. Margaretha Sheffield Brittany Buckley is a 61 y.o. female with a h/o  HTN, HLD, CAD (PCI done 2-3 years ago), DM, and AFib was admitted and EP consulted to aid in rate and/or rhythm control.  It was decided to pursue Tikosyn, so amiodarone was stopped for diltiazem gtt, the patient reported full compliance with her a/c, not missing any doses since started last year.  She did spontaneously convert to SR, after discussion with pharmacy for timing of Tikosyn given short duration of Amio she was started on Tiksoyn, unfortunately she had persistent QT prolongation despite down-titration eventually to the 156mcg dose and discontinued.  Telemetry remained stable, SR/SB, 50's-60's with V ectopy or arrhythmias.  Dr. Curt Bears discussed with the patient, will plan to start Multaq on Sunday to allow 48 hours of washout of Tikosyn.  We will keep her AF clinic follow up, as well as f/u with Dr. Curt Bears as planned.  Her EKG today noted her QTc much improved to 468ms.  The patient was examined by Dr. Curt Bears and considered stable for discharge to home.   F/u in afib clinic,4/25, ekg shows sinus brady with stable qtc at 461 ms. She has not noted any afib since d/c. She is c/o today of several fever blisters that have cropped up around her mouth, has had in the past and she is also c/o of upper right abdominal pain that she noted this am, 8/10. She states that it is still 3-4/10 at we speak. No nausea,vomiting or change in stools. Has noticed to some  in the past but not to this degree. Compliant with multaq and pradaxa.  Today, she denies symptoms of palpitations, chest pain, shortness of breath, orthopnea, PND, lower extremity edema, dizziness, presyncope, syncope, or neurologic sequela. The patient is tolerating medications without difficulties and is otherwise without complaint today.   Past Medical History:  Diagnosis Date  . A-fib  ([place])   . ADD (attention deficit disorder)   . Anginal pain ([place])   . Anxiety   . Arthritis    "hands, feet" (06/14/2016)  . Asthma   . Chronic lower back pain   . Chronic neck pain   . Coronary artery disease   . GERD (gastroesophageal reflux disease)   . Headache    "monthly" (06/14/2016)  . Heart murmur   . Hyperlipidemia   . Hypertension   . MI (myocardial infarction) ([place]) 2014; 2015  . Migraine    "probably twice/year" (06/14/2016)  . Numbness of toes   . Obesity   . On anticoagulant therapy   . Peripheral neuropathy   . Pneumonia 1990s  . Thyroid nodule   . Type II diabetes mellitus ([place])   . Urticaria    Past Surgical History:  Procedure Laterality Date  . ABDOMINAL HYSTERECTOMY    . BIOPSY THYROID    . CESAREAN SECTION    . CORONARY ANGIOPLASTY WITH STENT PLACEMENT  2014 & 2015  . TONSILLECTOMY AND ADENOIDECTOMY    . TRIGGER FINGER RELEASE Right    "pointer"    Current Outpatient Prescriptions  Medication Sig Dispense Refill  . albuterol (PROVENTIL HFA;VENTOLIN HFA) 108 (90 Base) MCG/ACT inhaler Inhale 1-2 puffs into the lungs every 6 (six) hours as needed for wheezing or shortness of breath.    Marland Kitchen atorvastatin (LIPITOR) 80 MG tablet Take 80 mg by mouth daily.    . dabigatran (PRADAXA) 150 MG CAPS capsule Take 150 mg  by mouth 2 (two) times daily.    Marland Kitchen diltiazem (CARDIZEM CD) 120 MG 24 hr capsule Take 1 capsule (120 mg total) by mouth daily. 30 capsule 6  . dronedarone (MULTAQ) 400 MG tablet Take 1 tablet (400 mg total) by mouth 2 (two) times daily with a meal. 60 tablet 6  . furosemide (LASIX) 40 MG tablet Take 40 mg by mouth daily.    Marland Kitchen glipiZIDE (GLUCOTROL) 5 MG tablet Take by mouth daily before breakfast.    . hydrALAZINE (APRESOLINE) 50 MG tablet Take 50 mg by mouth 3 (three) times daily.    . hydrocortisone cream 1 % Apply topically as needed for itching. 30 g 0  . lisinopril (PRINIVIL,ZESTRIL) 40 MG tablet Take 40 mg by mouth daily.    . metFORMIN  (GLUCOPHAGE) 1000 MG tablet Take 1,000 mg by mouth 2 (two) times daily with a meal.    . metoprolol (LOPRESSOR) 50 MG tablet Take 50 mg by mouth 2 (two) times daily.    . nitroGLYCERIN (NITROSTAT) 0.4 MG SL tablet Place 0.4 mg under the tongue every 5 (five) minutes as needed for chest pain.     No current facility-administered medications for this encounter.     Allergies  Allergen Reactions  . Codeine Hives    ONLY IN COUGH SYRUP  . Demerol [Meperidine] Rash  . Morphine And Related Rash    Social History   Social History  . Marital status: Married    Spouse name: N/A  . Number of children: N/A  . Years of education: N/A   Occupational History  . Not on file.   Social History Main Topics  . Smoking status: Never Smoker  . Smokeless tobacco: Never Used  . Alcohol use 0.6 oz/week    1 Glasses of wine per week  . Drug use: No  . Sexual activity: Yes     Comment: married   Other Topics Concern  . Not on file   Social History Narrative  . No narrative on file    Family History  Problem Relation Age of Onset  . Hyperlipidemia Mother   . CAD Father   . Heart attack Father   . Cancer - Ovarian Sister   . Diabetes Maternal Grandmother     blindness  . Heart disease Paternal Grandmother   . Diabetes Paternal Grandfather     with amputation  . Healthy Son   . Obesity Daughter     ROS- All systems are reviewed and negative except as per the HPI above  Physical Exam: Vitals:   06/22/16 1452  BP: 116/62  Pulse: (!) 55  Weight: 203 lb (92.1 kg)  Height: 5\' 8"  (1.727 m)   Wt Readings from Last 3 Encounters:  06/22/16 203 lb (92.1 kg)  06/17/16 199 lb 11.2 oz (90.6 kg)  03/21/16 206 lb 12.8 oz (93.8 kg)    Labs: Lab Results  Component Value Date   NA 140 06/22/2016   K 4.2 06/22/2016   CL 108 06/22/2016   CO2 25 06/22/2016   GLUCOSE 82 06/22/2016   BUN 17 06/22/2016   CREATININE 0.97 06/22/2016   CALCIUM 8.9 06/22/2016   MG 1.8 06/17/2016   No  results found for: INR Lab Results  Component Value Date   CHOL 179 04/04/2016   HDL 47 04/04/2016   LDLCALC 96 04/04/2016   TRIG 180 (H) 04/04/2016     GEN- The patient is well appearing, alert and oriented x 3 today.  Head- normocephalic, atraumatic Eyes-  Sclera clear, conjunctiva pink Ears- hearing intact Oropharynx- clear Neck- supple, no JVP Lymph- no cervical lymphadenopathy Lungs- Clear to ausculation bilaterally, normal work of breathing Heart- Regular rate and rhythm, no murmurs, rubs or gallops, PMI not laterally displaced GI- soft, RUQ tender to palpation,  + BS Extremities- no clubbing, cyanosis, or edema MS- no significant deformity or atrophy Skin- no rash or lesion Psych- euthymic mood, full affect Neuro- strength and sensation are intact  EKG- Sinus brady at 55 bpm, pr int 156 ms, qrs int 74 ms, qtc 461 ms Epic records reviewed    Assessment and Plan: 1. afib Maintaining SR on multaq 400 mg bid Continue pradaxa 150 mg bid Continue diltiazem 120 mg a day   2. RUQ pain States not entirely new symptom but pain more intense today Cmet RUQ U/S ordered but not stat as pt has to be NPO Order placed per work que and pt should be notified as per time/date To the ER if worsens  Butch Penny C. Diamonds [place], [facility] [address] [place],  [phone]

## 2016-06-27 ENCOUNTER — Ambulatory Visit (HOSPITAL_COMMUNITY): Payer: Commercial Managed Care - PPO

## 2016-06-28 DIAGNOSIS — Z719 Counseling, unspecified: Secondary | ICD-10-CM | POA: Diagnosis not present

## 2016-06-30 ENCOUNTER — Ambulatory Visit (HOSPITAL_COMMUNITY)
Admission: RE | Admit: 2016-06-30 | Discharge: 2016-06-30 | Disposition: A | Discharge status: Home / Self Care | Payer: Commercial Managed Care - PPO | Source: Ambulatory Visit | Attending: Nurse Practitioner | Admitting: Nurse Practitioner

## 2016-06-30 DIAGNOSIS — R1011 Right upper quadrant pain: Secondary | ICD-10-CM | POA: Diagnosis present

## 2016-07-08 DIAGNOSIS — Z719 Counseling, unspecified: Secondary | ICD-10-CM | POA: Diagnosis not present

## 2016-07-18 ENCOUNTER — Ambulatory Visit (INDEPENDENT_AMBULATORY_CARE_PROVIDER_SITE_OTHER): Payer: Commercial Managed Care - PPO | Admitting: Cardiology

## 2016-07-18 ENCOUNTER — Encounter (INDEPENDENT_AMBULATORY_CARE_PROVIDER_SITE_OTHER): Payer: Self-pay

## 2016-07-18 ENCOUNTER — Ambulatory Visit: Payer: Commercial Managed Care - PPO | Admitting: Cardiology

## 2016-07-18 ENCOUNTER — Encounter: Payer: Self-pay | Admitting: Cardiology

## 2016-07-18 VITALS — BP 142/80 | HR 51 | Ht 68.0 in | Wt 207.6 lb

## 2016-07-18 DIAGNOSIS — I25118 Atherosclerotic heart disease of native coronary artery with other forms of angina pectoris: Secondary | ICD-10-CM | POA: Diagnosis not present

## 2016-07-18 DIAGNOSIS — I10 Essential (primary) hypertension: Secondary | ICD-10-CM

## 2016-07-18 DIAGNOSIS — I480 Paroxysmal atrial fibrillation: Secondary | ICD-10-CM

## 2016-07-18 NOTE — Progress Notes (Signed)
Electrophysiology Office Note   Date:  07/18/2016   ID:  Brittany Buckley, DOB 12-Nov-1955, MRN [id number]  PCP:  Marton Redwood, MD  Cardiologist:  [place] Primary Electrophysiologist:  Elenor Wildes Meredith Leeds, MD    Chief Complaint  Patient presents with  . Follow-up    Afib     History of Present Illness: Brittany Buckley is a 61 y.o. female who is being seen today for the evaluation of atrial fibrillaiton at the request of Marton Redwood, MD. Presenting today for electrophysiology evaluation. She has a history of atrial fibrillation with attempted Tikosyn load, but aborted secondary to QT prolongation. She also has a history of hypertension, hyperlipidemia, coronary disease with PCI, diabetes. Due to QT prolongation, she was started on Multaq.  Today, she denies symptoms of palpitations, orthopnea, PND, lower extremity edema, claudication, dizziness, presyncope, syncope, bleeding, or neurologic sequela. The patient is tolerating medications without difficulties. She has been feeling well without evidence of palpitations. She does complain of fatigue. She has noted that her heart rate has been in the 50s, and she is having difficulty getting her heart rate to rise when she is exerting herself. She also has occasional chest tightness, but is not had to take any nitroglycerin. Her chest tightness usually occurs when she is exerting herself and improves with rest.   Past Medical History:  Diagnosis Date  . A-fib ([place])   . ADD (attention deficit disorder)   . Anginal pain ([place])   . Anxiety   . Arthritis    "hands, feet" (06/14/2016)  . Asthma   . Chronic lower back pain   . Chronic neck pain   . Coronary artery disease   . GERD (gastroesophageal reflux disease)   . Headache    "monthly" (06/14/2016)  . Heart murmur   . Hyperlipidemia   . Hypertension   . MI (myocardial infarction) ([place]) 2014; 2015  . Migraine    "probably twice/year" (06/14/2016)  . Numbness of toes   . Obesity    . On anticoagulant therapy   . Peripheral neuropathy   . Pneumonia 1990s  . Thyroid nodule   . Type II diabetes mellitus ([place])   . Urticaria    Past Surgical History:  Procedure Laterality Date  . ABDOMINAL HYSTERECTOMY    . BIOPSY THYROID    . CESAREAN SECTION    . CORONARY ANGIOPLASTY WITH STENT PLACEMENT  2014 & 2015  . TONSILLECTOMY AND ADENOIDECTOMY    . TRIGGER FINGER RELEASE Right    "pointer"     Current Outpatient Prescriptions  Medication Sig Dispense Refill  . albuterol (PROVENTIL HFA;VENTOLIN HFA) 108 (90 Base) MCG/ACT inhaler Inhale 1-2 puffs into the lungs every 6 (six) hours as needed for wheezing or shortness of breath.    Marland Kitchen atorvastatin (LIPITOR) 80 MG tablet Take 80 mg by mouth daily.    . dabigatran (PRADAXA) 150 MG CAPS capsule Take 150 mg by mouth 2 (two) times daily.    Marland Kitchen diltiazem (CARDIZEM CD) 120 MG 24 hr capsule Take 1 capsule (120 mg total) by mouth daily. 30 capsule 6  . dronedarone (MULTAQ) 400 MG tablet Take 1 tablet (400 mg total) by mouth 2 (two) times daily with a meal. 60 tablet 6  . furosemide (LASIX) 40 MG tablet Take 40 mg by mouth daily.    Marland Kitchen glipiZIDE (GLUCOTROL) 5 MG tablet Take by mouth daily before breakfast.    . hydrALAZINE (APRESOLINE) 50 MG tablet Take 50 mg by mouth 3 (three) times  daily.    . hydrocortisone cream 1 % Apply topically as needed for itching. 30 g 0  . lisinopril (PRINIVIL,ZESTRIL) 40 MG tablet Take 40 mg by mouth daily.    . metFORMIN (GLUCOPHAGE) 1000 MG tablet Take 1,000 mg by mouth 2 (two) times daily with a meal.    . metoprolol (LOPRESSOR) 50 MG tablet Take 50 mg by mouth 2 (two) times daily.    . nitroGLYCERIN (NITROSTAT) 0.4 MG SL tablet Place 0.4 mg under the tongue every 5 (five) minutes as needed for chest pain.     No current facility-administered medications for this visit.     Allergies:   Codeine; Demerol [meperidine]; and Morphine and related   Social History:  The patient  reports that she has  never smoked. She has never used smokeless tobacco. She reports that she drinks about 0.6 oz of alcohol per week . She reports that she does not use drugs.   Family History:  The patient's family history includes CAD in her father; Cancer - Ovarian in her sister; Diabetes in her maternal grandmother and paternal grandfather; Healthy in her son; Heart attack in her father; Heart disease in her paternal grandmother; Hyperlipidemia in her mother; Obesity in her daughter.    ROS:  Please see the history of present illness.   Otherwise, review of systems is positive for weight gain, chest pain, .   shortness of breath All other systems are reviewed and negative.    PHYSICAL EXAM: VS:  BP (!) 142/80   Pulse (!) 51   Ht 5\' 8"  (1.727 m)   Wt 207 lb 9.6 oz (94.2 kg)   BMI 31.57 kg/m  , BMI Body mass index is 31.57 kg/m. GEN: Well nourished, well developed, in no acute distress  HEENT: normal  Neck: no JVD, carotid bruits, or masses Cardiac: RRR; no murmurs, rubs, or gallops,no edema  Respiratory:  clear to auscultation bilaterally, normal work of breathing GI: soft, nontender, nondistended, + BS MS: no deformity or atrophy  Skin: warm and dry Neuro:  Strength and sensation are intact Psych: euthymic mood, full affect  EKG:  EKG is ordered today. Personal review of the ekg ordered shows sinus rhythm, rate 51, QTc 486  Recent Labs: 06/14/2016: Hemoglobin 14.1; Platelets 182; TSH 0.923 06/17/2016: Magnesium 1.8 06/22/2016: ALT 26; BUN 17; Creatinine, Ser 0.97; Potassium 4.2; Sodium 140    Lipid Panel     Component Value Date/Time   CHOL 179 04/04/2016 1540   TRIG 180 (H) 04/04/2016 1540   HDL 47 04/04/2016 1540   CHOLHDL 3.8 04/04/2016 1540   LDLCALC 96 04/04/2016 1540     Wt Readings from Last 3 Encounters:  07/18/16 207 lb 9.6 oz (94.2 kg)  06/22/16 203 lb (92.1 kg)  06/17/16 199 lb 11.2 oz (90.6 kg)      Other studies Reviewed: Additional studies/ records that were reviewed  today include: TTE 04/04/16  Review of the above records today demonstrates:  - Left ventricle: The cavity size was normal. There was mild focal   basal hypertrophy of the septum. Systolic function was normal.   The estimated ejection fraction was in the range of 60% to 65%.   Wall motion was normal; there were no regional wall motion   abnormalities. Doppler parameters are consistent with abnormal   left ventricular relaxation (grade 1 diastolic dysfunction). - Aortic valve: Trileaflet; mildly thickened, mildly calcified   leaflets. - Left atrium: The atrium was mildly dilated. Volume/bsa, ES,   (  1-plane Simpson&'s, A2C): 38.1 ml/m^2.   ASSESSMENT AND PLAN:  1.  Paroxysmal atrial fibrillation: Currently on Pradaxa for anticoagulation. Has failed Tikosyn and is now on multaq. She does complain of fatigue and weakness, and her heart rate is consistently in the 50s. Due to that, we'll stop her diltiazem.   This patients CHA2DS2-VASc Score and unadjusted Ischemic Stroke Rate (% per year) is equal to 4.8 % stroke rate/year from a score of 4  Above score calculated as 1 point each if present [CHF, HTN, DM, Vascular=MI/PAD/Aortic Plaque, Age if 65-74, or Female] Above score calculated as 2 points each if present [Age > 75, or Stroke/TIA/TE]  2. Coronary artery disease with stable angina not having to take nitroglycerin for short lived chest pain. We'll continue current management. If she does have worsening chest pain, she may benefit from stress testing.  3. HypMildly elevated today, but usually less than 140 at home. She Brittany Buckley touch base in 2 weeks with blood pressure recordings.   Current medicines are reviewed at length with the patient today.   The patient does not have concerns regarding her medicines.  The following changes were made today:  Stop diltiazem   Labs/ tests ordered today include:  Orders Placed This Encounter  Procedures  . EKG 12-Lead     Disposition:   FU with Brittany Buckley 6 months  Signed, Birch Farino Meredith Leeds, MD  07/18/2016 2:44 PM     [facility] [address] [place] [address] [phone] (office) [phone] (fax)

## 2016-07-18 NOTE — Patient Instructions (Addendum)
Medication Instructions:  STOP Diltiazem  Labwork:  None ordered  Testing/Procedures:  None ordered  Follow-Up:  Your physician wants you to follow-up in: 6 months  with Dr. Curt Bears.  You will receive a reminder letter in the mail two months in advance. If you don't receive a letter, please call our office to schedule the follow-up appointment.  - If you need a refill on your cardiac medications before your next appointment, please call your pharmacy.   Thank you for choosing [facility]!!   Trinidad Curet, RN [phone]

## 2016-10-03 DIAGNOSIS — I10 Essential (primary) hypertension: Secondary | ICD-10-CM | POA: Diagnosis not present

## 2016-10-03 DIAGNOSIS — E1149 Type 2 diabetes mellitus with other diabetic neurological complication: Secondary | ICD-10-CM | POA: Diagnosis not present

## 2016-10-03 DIAGNOSIS — Z0000 Encounter for general adult medical examination without abnormal findings: Secondary | ICD-10-CM | POA: Diagnosis not present

## 2016-10-05 DIAGNOSIS — E1129 Type 2 diabetes mellitus with other diabetic kidney complication: Secondary | ICD-10-CM | POA: Diagnosis not present

## 2016-10-05 DIAGNOSIS — E1149 Type 2 diabetes mellitus with other diabetic neurological complication: Secondary | ICD-10-CM | POA: Diagnosis not present

## 2016-10-05 DIAGNOSIS — Z1389 Encounter for screening for other disorder: Secondary | ICD-10-CM | POA: Diagnosis not present

## 2016-10-05 DIAGNOSIS — Z0000 Encounter for general adult medical examination without abnormal findings: Secondary | ICD-10-CM | POA: Diagnosis not present

## 2016-10-05 DIAGNOSIS — Z23 Encounter for immunization: Secondary | ICD-10-CM | POA: Diagnosis not present

## 2016-10-05 DIAGNOSIS — I10 Essential (primary) hypertension: Secondary | ICD-10-CM | POA: Diagnosis not present

## 2016-10-05 DIAGNOSIS — R5383 Other fatigue: Secondary | ICD-10-CM | POA: Diagnosis not present

## 2016-10-07 ENCOUNTER — Other Ambulatory Visit: Payer: Self-pay | Admitting: Internal Medicine

## 2016-10-07 DIAGNOSIS — Z1231 Encounter for screening mammogram for malignant neoplasm of breast: Secondary | ICD-10-CM

## 2016-10-13 ENCOUNTER — Ambulatory Visit: Payer: Commercial Managed Care - PPO

## 2016-10-21 ENCOUNTER — Telehealth: Payer: Self-pay | Admitting: *Deleted

## 2016-10-21 NOTE — Telephone Encounter (Signed)
Follow Up ° ° °Pt calling back °

## 2016-10-21 NOTE — Telephone Encounter (Signed)
Follow up ° ° ° ° ° °Returning a call to the nurse °

## 2016-10-21 NOTE — Telephone Encounter (Signed)
Pt reports started Lexapro earlier this month, 8/8. Advised pt to stop this medication and explained that she cannot take an SSRI while taking Multaq. Given recommendation to try an SNRI like Duloxetine, instead. She is agreeable to stop Lexapro. She will come by the office on Tuesday for an EKG. She will follow up w/ her PCP next week for alternative and safer antidepressant to take while taking Multaq. Will forward this note to Dr. Brigitte Pulse, per her request. (will fax to his office Monday)  Patient verbalized understanding and agreeable to plan.

## 2016-10-21 NOTE — Telephone Encounter (Addendum)
Received records from PCP office, Dr. Brigitte Pulse [facility]) -- states they started pt on Lexapro on 8/22.  lmtcb to advise pt to stop this medication as it cannot be taken with Multaq. (Pharmacist recommends pt starting Duloxetine (Cymbalta), an SNRI instead)

## 2016-10-25 ENCOUNTER — Ambulatory Visit: Payer: Commercial Managed Care - PPO | Admitting: *Deleted

## 2016-10-25 ENCOUNTER — Encounter: Payer: Self-pay | Admitting: Cardiology

## 2016-10-25 DIAGNOSIS — I480 Paroxysmal atrial fibrillation: Secondary | ICD-10-CM

## 2016-10-25 NOTE — Patient Instructions (Addendum)
Medication Instructions:  Your physician recommends that you continue on your current medications as directed. Please refer to the Current Medication list given to you today.  If you need a refill on your cardiac medications before your next appointment, please call your pharmacy.   Labwork: None ordered  Testing/Procedures: None ordered  Follow-Up: In November/December with Dr. Curt Bears  Thank you for choosing [facility]!!   Trinidad Curet, RN [phone]

## 2016-10-25 NOTE — Progress Notes (Addendum)
Pt comes by today for EKG per our request. (see 8/24 office note) EKG reviewed by Dr. Curt Bears, no orders/changes. No charge EKG

## 2016-11-09 ENCOUNTER — Institutional Professional Consult (permissible substitution): Payer: Self-pay | Admitting: Neurology

## 2016-11-10 ENCOUNTER — Institutional Professional Consult (permissible substitution): Payer: Self-pay | Admitting: Neurology

## 2016-12-22 ENCOUNTER — Inpatient Hospital Stay (HOSPITAL_COMMUNITY)
Admission: EM | Admit: 2016-12-22 | Discharge: 2016-12-24 | DRG: 392 | Disposition: A | Discharge status: Home / Self Care | Payer: Commercial Managed Care - PPO | Attending: Internal Medicine | Admitting: Internal Medicine

## 2016-12-22 ENCOUNTER — Encounter (HOSPITAL_COMMUNITY): Payer: Self-pay

## 2016-12-22 ENCOUNTER — Emergency Department (HOSPITAL_COMMUNITY): Payer: Commercial Managed Care - PPO

## 2016-12-22 DIAGNOSIS — E861 Hypovolemia: Secondary | ICD-10-CM | POA: Diagnosis present

## 2016-12-22 DIAGNOSIS — K219 Gastro-esophageal reflux disease without esophagitis: Secondary | ICD-10-CM | POA: Diagnosis present

## 2016-12-22 DIAGNOSIS — Z955 Presence of coronary angioplasty implant and graft: Secondary | ICD-10-CM

## 2016-12-22 DIAGNOSIS — G8929 Other chronic pain: Secondary | ICD-10-CM | POA: Diagnosis present

## 2016-12-22 DIAGNOSIS — R109 Unspecified abdominal pain: Secondary | ICD-10-CM | POA: Diagnosis not present

## 2016-12-22 DIAGNOSIS — M19071 Primary osteoarthritis, right ankle and foot: Secondary | ICD-10-CM | POA: Diagnosis present

## 2016-12-22 DIAGNOSIS — E785 Hyperlipidemia, unspecified: Secondary | ICD-10-CM | POA: Diagnosis present

## 2016-12-22 DIAGNOSIS — Z90710 Acquired absence of both cervix and uterus: Secondary | ICD-10-CM

## 2016-12-22 DIAGNOSIS — M19042 Primary osteoarthritis, left hand: Secondary | ICD-10-CM | POA: Diagnosis present

## 2016-12-22 DIAGNOSIS — F988 Other specified behavioral and emotional disorders with onset usually occurring in childhood and adolescence: Secondary | ICD-10-CM | POA: Diagnosis present

## 2016-12-22 DIAGNOSIS — R1030 Lower abdominal pain, unspecified: Secondary | ICD-10-CM | POA: Diagnosis not present

## 2016-12-22 DIAGNOSIS — E1142 Type 2 diabetes mellitus with diabetic polyneuropathy: Secondary | ICD-10-CM | POA: Diagnosis present

## 2016-12-22 DIAGNOSIS — M545 Low back pain: Secondary | ICD-10-CM | POA: Diagnosis present

## 2016-12-22 DIAGNOSIS — Z885 Allergy status to narcotic agent status: Secondary | ICD-10-CM

## 2016-12-22 DIAGNOSIS — Z8249 Family history of ischemic heart disease and other diseases of the circulatory system: Secondary | ICD-10-CM

## 2016-12-22 DIAGNOSIS — M19041 Primary osteoarthritis, right hand: Secondary | ICD-10-CM | POA: Diagnosis present

## 2016-12-22 DIAGNOSIS — E876 Hypokalemia: Secondary | ICD-10-CM | POA: Diagnosis not present

## 2016-12-22 DIAGNOSIS — E7800 Pure hypercholesterolemia, unspecified: Secondary | ICD-10-CM | POA: Diagnosis present

## 2016-12-22 DIAGNOSIS — I252 Old myocardial infarction: Secondary | ICD-10-CM

## 2016-12-22 DIAGNOSIS — I959 Hypotension, unspecified: Secondary | ICD-10-CM | POA: Diagnosis present

## 2016-12-22 DIAGNOSIS — E119 Type 2 diabetes mellitus without complications: Secondary | ICD-10-CM

## 2016-12-22 DIAGNOSIS — I2510 Atherosclerotic heart disease of native coronary artery without angina pectoris: Secondary | ICD-10-CM | POA: Diagnosis present

## 2016-12-22 DIAGNOSIS — K529 Noninfective gastroenteritis and colitis, unspecified: Secondary | ICD-10-CM | POA: Diagnosis not present

## 2016-12-22 DIAGNOSIS — Z833 Family history of diabetes mellitus: Secondary | ICD-10-CM

## 2016-12-22 DIAGNOSIS — Z7984 Long term (current) use of oral hypoglycemic drugs: Secondary | ICD-10-CM | POA: Diagnosis not present

## 2016-12-22 DIAGNOSIS — M19072 Primary osteoarthritis, left ankle and foot: Secondary | ICD-10-CM | POA: Diagnosis present

## 2016-12-22 DIAGNOSIS — Z8349 Family history of other endocrine, nutritional and metabolic diseases: Secondary | ICD-10-CM

## 2016-12-22 DIAGNOSIS — Z821 Family history of blindness and visual loss: Secondary | ICD-10-CM

## 2016-12-22 DIAGNOSIS — F419 Anxiety disorder, unspecified: Secondary | ICD-10-CM | POA: Diagnosis present

## 2016-12-22 DIAGNOSIS — I10 Essential (primary) hypertension: Secondary | ICD-10-CM | POA: Diagnosis present

## 2016-12-22 DIAGNOSIS — I4891 Unspecified atrial fibrillation: Secondary | ICD-10-CM | POA: Diagnosis not present

## 2016-12-22 DIAGNOSIS — Z7902 Long term (current) use of antithrombotics/antiplatelets: Secondary | ICD-10-CM

## 2016-12-22 DIAGNOSIS — F329 Major depressive disorder, single episode, unspecified: Secondary | ICD-10-CM | POA: Diagnosis present

## 2016-12-22 LAB — I-STAT CHEM 8, ED
BUN: 21 mg/dL — ABNORMAL HIGH (ref 6–20)
CREATININE: 1 mg/dL (ref 0.44–1.00)
Calcium, Ion: 1.09 mmol/L — ABNORMAL LOW (ref 1.15–1.40)
Chloride: 103 mmol/L (ref 101–111)
Glucose, Bld: 242 mg/dL — ABNORMAL HIGH (ref 65–99)
HEMATOCRIT: 51 % — AB (ref 36.0–46.0)
HEMOGLOBIN: 17.3 g/dL — AB (ref 12.0–15.0)
Potassium: 3.4 mmol/L — ABNORMAL LOW (ref 3.5–5.1)
SODIUM: 140 mmol/L (ref 135–145)
TCO2: 25 mmol/L (ref 22–32)

## 2016-12-22 LAB — CBC WITH DIFFERENTIAL/PLATELET
BASOS ABS: 0 10*3/uL (ref 0.0–0.1)
Basophils Relative: 0 %
Eosinophils Absolute: 0.3 10*3/uL (ref 0.0–0.7)
Eosinophils Relative: 4 %
HEMATOCRIT: 48.6 % — AB (ref 36.0–46.0)
Hemoglobin: 16.2 g/dL — ABNORMAL HIGH (ref 12.0–15.0)
LYMPHS PCT: 32 %
Lymphs Abs: 2.7 10*3/uL (ref 0.7–4.0)
MCH: 28.1 pg (ref 26.0–34.0)
MCHC: 33.3 g/dL (ref 30.0–36.0)
MCV: 84.2 fL (ref 78.0–100.0)
Monocytes Absolute: 0.4 10*3/uL (ref 0.1–1.0)
Monocytes Relative: 5 %
NEUTROS ABS: 5 10*3/uL (ref 1.7–7.7)
Neutrophils Relative %: 59 %
PLATELETS: 251 10*3/uL (ref 150–400)
RBC: 5.77 MIL/uL — AB (ref 3.87–5.11)
RDW: 14.6 % (ref 11.5–15.5)
WBC: 8.4 10*3/uL (ref 4.0–10.5)

## 2016-12-22 LAB — I-STAT CG4 LACTIC ACID, ED
LACTIC ACID, VENOUS: 2.88 mmol/L — AB (ref 0.5–1.9)
LACTIC ACID, VENOUS: 1.64 mmol/L (ref 0.5–1.9)

## 2016-12-22 LAB — I-STAT TROPONIN, ED: TROPONIN I, POC: 0.01 ng/mL (ref 0.00–0.08)

## 2016-12-22 LAB — COMPREHENSIVE METABOLIC PANEL
ALBUMIN: 4.4 g/dL (ref 3.5–5.0)
ALT: 43 U/L (ref 14–54)
ANION GAP: 14 (ref 5–15)
AST: 42 U/L — ABNORMAL HIGH (ref 15–41)
Alkaline Phosphatase: 85 U/L (ref 38–126)
BILIRUBIN TOTAL: 1 mg/dL (ref 0.3–1.2)
BUN: 17 mg/dL (ref 6–20)
CO2: 23 mmol/L (ref 22–32)
Calcium: 9.5 mg/dL (ref 8.9–10.3)
Chloride: 101 mmol/L (ref 101–111)
Creatinine, Ser: 1.14 mg/dL — ABNORMAL HIGH (ref 0.44–1.00)
GFR calc Af Amer: 59 mL/min — ABNORMAL LOW (ref >60)
GFR, EST NON AFRICAN AMERICAN: 51 mL/min — AB (ref >60)
GLUCOSE: 238 mg/dL — AB (ref 65–99)
POTASSIUM: 3.4 mmol/L — AB (ref 3.5–5.1)
Sodium: 138 mmol/L (ref 135–145)
TOTAL PROTEIN: 7.6 g/dL (ref 6.5–8.1)

## 2016-12-22 LAB — POC OCCULT BLOOD, ED: Fecal Occult Bld: NEGATIVE

## 2016-12-22 LAB — GLUCOSE, CAPILLARY: Glucose-Capillary: 78 mg/dL (ref 65–99)

## 2016-12-22 LAB — CBG MONITORING, ED
Glucose-Capillary: 251 mg/dL — ABNORMAL HIGH (ref 65–99)
Glucose-Capillary: 105 mg/dL — ABNORMAL HIGH (ref 65–99)

## 2016-12-22 MED ORDER — SODIUM CHLORIDE 0.9 % IV SOLN: 250.0000 mL | INTRAVENOUS | Status: DC | PRN

## 2016-12-22 MED ORDER — ALBUTEROL SULFATE HFA 108 (90 BASE) MCG/ACT IN AERS: 1.0000 | INHALATION_SPRAY | Freq: Four times a day (QID) | RESPIRATORY_TRACT | Status: DC | PRN

## 2016-12-22 MED ORDER — ONDANSETRON HCL 4 MG/2ML IJ SOLN: 4.0000 mg | Freq: Four times a day (QID) | INTRAMUSCULAR | Status: DC | PRN

## 2016-12-22 MED ORDER — DRONEDARONE HCL 400 MG PO TABS
400.0000 mg | ORAL_TABLET | Freq: Two times a day (BID) | ORAL | Status: DC
  Administered 2016-12-23 – 2016-12-24 (×3): 400 mg via ORAL
  Filled 2016-12-22 (×6): qty 1

## 2016-12-22 MED ORDER — METOPROLOL TARTRATE 25 MG PO TABS
50.0000 mg | ORAL_TABLET | Freq: Two times a day (BID) | ORAL | Status: DC
  Filled 2016-12-22: qty 2

## 2016-12-22 MED ORDER — METRONIDAZOLE IN NACL 5-0.79 MG/ML-% IV SOLN
500.0000 mg | Freq: Three times a day (TID) | INTRAVENOUS | Status: DC
  Administered 2016-12-22 – 2016-12-24 (×5): 500 mg via INTRAVENOUS
  Filled 2016-12-22 (×5): qty 100

## 2016-12-22 MED ORDER — ONDANSETRON HCL 4 MG PO TABS: 4.0000 mg | ORAL_TABLET | Freq: Four times a day (QID) | ORAL | Status: DC | PRN

## 2016-12-22 MED ORDER — FENTANYL CITRATE (PF) 100 MCG/2ML IJ SOLN
50.0000 ug | Freq: Once | INTRAMUSCULAR | Status: AC
  Administered 2016-12-22: 50 ug via INTRAVENOUS
  Filled 2016-12-22: qty 2

## 2016-12-22 MED ORDER — TRAZODONE HCL 50 MG PO TABS: 50.0000 mg | ORAL_TABLET | Freq: Every evening | ORAL | Status: DC | PRN

## 2016-12-22 MED ORDER — LACTATED RINGERS IV BOLUS (SEPSIS)
1000.0000 mL | Freq: Once | INTRAVENOUS | Status: AC
  Administered 2016-12-22: 1000 mL via INTRAVENOUS

## 2016-12-22 MED ORDER — SODIUM CHLORIDE 0.9% FLUSH: 3.0000 mL | INTRAVENOUS | Status: DC | PRN

## 2016-12-22 MED ORDER — OXYCODONE HCL 5 MG PO TABS
5.0000 mg | ORAL_TABLET | ORAL | Status: DC | PRN
  Administered 2016-12-22 – 2016-12-23 (×2): 5 mg via ORAL
  Filled 2016-12-22 (×2): qty 1

## 2016-12-22 MED ORDER — SODIUM CHLORIDE 0.9 % IV BOLUS (SEPSIS)
1000.0000 mL | Freq: Once | INTRAVENOUS | Status: AC
  Administered 2016-12-22: 1000 mL via INTRAVENOUS

## 2016-12-22 MED ORDER — SODIUM CHLORIDE 0.9 % IV SOLN
INTRAVENOUS | Status: DC
  Administered 2016-12-22 – 2016-12-24 (×4): via INTRAVENOUS

## 2016-12-22 MED ORDER — SODIUM CHLORIDE 0.9% FLUSH
3.0000 mL | Freq: Two times a day (BID) | INTRAVENOUS | Status: DC
  Administered 2016-12-22: 3 mL via INTRAVENOUS

## 2016-12-22 MED ORDER — ACETAMINOPHEN 650 MG RE SUPP: 650.0000 mg | Freq: Four times a day (QID) | RECTAL | Status: DC | PRN

## 2016-12-22 MED ORDER — ALBUTEROL SULFATE (2.5 MG/3ML) 0.083% IN NEBU: 2.5000 mg | INHALATION_SOLUTION | RESPIRATORY_TRACT | Status: DC | PRN

## 2016-12-22 MED ORDER — CIPROFLOXACIN IN D5W 400 MG/200ML IV SOLN
400.0000 mg | Freq: Two times a day (BID) | INTRAVENOUS | Status: DC
  Administered 2016-12-22 – 2016-12-24 (×5): 400 mg via INTRAVENOUS
  Filled 2016-12-22 (×5): qty 200

## 2016-12-22 MED ORDER — HYDRALAZINE HCL 20 MG/ML IJ SOLN: 10.0000 mg | Freq: Four times a day (QID) | INTRAMUSCULAR | Status: DC | PRN

## 2016-12-22 MED ORDER — DABIGATRAN ETEXILATE MESYLATE 150 MG PO CAPS
150.0000 mg | ORAL_CAPSULE | Freq: Two times a day (BID) | ORAL | Status: DC
  Administered 2016-12-22 – 2016-12-24 (×4): 150 mg via ORAL
  Filled 2016-12-22 (×5): qty 1

## 2016-12-22 MED ORDER — IOPAMIDOL (ISOVUE-300) INJECTION 61%
INTRAVENOUS | Status: AC
  Administered 2016-12-22: 100 mL
  Filled 2016-12-22: qty 100

## 2016-12-22 MED ORDER — ESCITALOPRAM OXALATE 10 MG PO TABS: 10.0000 mg | ORAL_TABLET | Freq: Every day | ORAL | Status: DC

## 2016-12-22 MED ORDER — METRONIDAZOLE IN NACL 5-0.79 MG/ML-% IV SOLN
500.0000 mg | Freq: Three times a day (TID) | INTRAVENOUS | Status: DC
  Administered 2016-12-22: 500 mg via INTRAVENOUS
  Filled 2016-12-22: qty 100

## 2016-12-22 MED ORDER — FUROSEMIDE 20 MG PO TABS: 40.0000 mg | ORAL_TABLET | Freq: Every day | ORAL | Status: DC

## 2016-12-22 MED ORDER — LISINOPRIL 20 MG PO TABS
40.0000 mg | ORAL_TABLET | Freq: Every day | ORAL | Status: DC
  Administered 2016-12-23 – 2016-12-24 (×2): 40 mg via ORAL
  Filled 2016-12-22 (×2): qty 2

## 2016-12-22 MED ORDER — INSULIN ASPART 100 UNIT/ML ~~LOC~~ SOLN
0.0000 [IU] | Freq: Three times a day (TID) | SUBCUTANEOUS | Status: DC
  Administered 2016-12-23 – 2016-12-24 (×4): 1 [IU] via SUBCUTANEOUS
  Administered 2016-12-24: 2 [IU] via SUBCUTANEOUS

## 2016-12-22 MED ORDER — HYDRALAZINE HCL 50 MG PO TABS: 50.0000 mg | ORAL_TABLET | Freq: Three times a day (TID) | ORAL | Status: DC

## 2016-12-22 MED ORDER — ACETAMINOPHEN 325 MG PO TABS
650.0000 mg | ORAL_TABLET | Freq: Four times a day (QID) | ORAL | Status: DC | PRN
  Administered 2016-12-22 – 2016-12-24 (×2): 650 mg via ORAL
  Filled 2016-12-22 (×4): qty 2

## 2016-12-22 MED ORDER — NITROGLYCERIN 0.4 MG SL SUBL: 0.4000 mg | SUBLINGUAL_TABLET | SUBLINGUAL | Status: DC | PRN

## 2016-12-22 MED ORDER — ATORVASTATIN CALCIUM 80 MG PO TABS
80.0000 mg | ORAL_TABLET | Freq: Every day | ORAL | Status: DC
  Administered 2016-12-23 – 2016-12-24 (×2): 80 mg via ORAL
  Filled 2016-12-22 (×2): qty 1

## 2016-12-22 NOTE — Progress Notes (Signed)
Bradycardic HR 50 and hypertensive. Hold lopressor and multaq for now. IV hydralazine prn for SBP>180 or DBP>105.

## 2016-12-22 NOTE — ED Notes (Signed)
Pt urinated in stool sample, sample discarded

## 2016-12-22 NOTE — ED Provider Notes (Signed)
[facility] EMERGENCY DEPARTMENT Provider Note   CSN: [id number] Arrival date & time: 12/22/16  0844     History   Chief Complaint Chief Complaint  Patient presents with  . Abdominal Pain  . Hypotension    HPI Brittany Buckley is a 61 y.o. female.  The history is provided by the patient. No language interpreter was used.  Abdominal Pain      Brittany Buckley is a 61 y.o. female who presents to the Emergency Department complaining of abdominal pain.  She presents from home for evaluation of sudden onset severe lower abdominal pain with cramping and diarrhea.  She reports feeling sweaty.  She has associated nausea.  Symptoms started after having a strenuous bowel movement.  No prior similar symptoms.  No recent sick contacts or antibiotics.  She has a history of atrial fibrillation and takes Pradaxa.  She also has a history of diabetes and hypertension.  She did take her morning medications this morning.  Past Medical History:  Diagnosis Date  . A-fib ([place])   . ADD (attention deficit disorder)   . Anginal pain ([place])   . Anxiety   . Arthritis    "hands, feet" (06/14/2016)  . Asthma   . Chronic lower back pain   . Chronic neck pain   . Coronary artery disease   . GERD (gastroesophageal reflux disease)   . Headache    "monthly" (06/14/2016)  . Heart murmur   . Hyperlipidemia   . Hypertension   . MI (myocardial infarction) ([place]) 2014; 2015  . Migraine    "probably twice/year" (06/14/2016)  . Numbness of toes   . Obesity   . On anticoagulant therapy   . Peripheral neuropathy   . Pneumonia 1990s  . Thyroid nodule   . Type II diabetes mellitus ([place])   . Urticaria     Patient Active Problem List   Diagnosis Date Noted  . Atrial fibrillation with rapid ventricular response ([place]) 06/14/2016  . Murmur, cardiac 03/21/2016  . Hypercholesteremia 03/21/2016    Past Surgical History:  Procedure Laterality Date  . ABDOMINAL HYSTERECTOMY    . BIOPSY  THYROID    . CESAREAN SECTION    . CORONARY ANGIOPLASTY WITH STENT PLACEMENT  2014 & 2015  . TONSILLECTOMY AND ADENOIDECTOMY    . TRIGGER FINGER RELEASE Right    "pointer"    OB History    No data available       Home Medications    Prior to Admission medications   Medication Sig Start Date End Date Taking? Authorizing Provider  albuterol (PROVENTIL HFA;VENTOLIN HFA) 108 (90 Base) MCG/ACT inhaler Inhale 1-2 puffs into the lungs every 6 (six) hours as needed for wheezing or shortness of breath.    [provider]  atorvastatin (LIPITOR) 80 MG tablet Take 80 mg by mouth daily.    [provider]  dabigatran (PRADAXA) 150 MG CAPS capsule Take 150 mg by mouth 2 (two) times daily.    [provider]  dronedarone (MULTAQ) 400 MG tablet Take 1 tablet (400 mg total) by mouth 2 (two) times daily with a meal. 06/17/16   Baldwin Jamaica, PA-C  escitalopram (LEXAPRO) 10 MG tablet Take 10 mg by mouth daily.    [provider]  furosemide (LASIX) 40 MG tablet Take 40 mg by mouth daily.    [provider]  glipiZIDE (GLUCOTROL) 5 MG tablet Take by mouth 2 (two) times daily before a meal.  [provider]  hydrALAZINE (APRESOLINE) 50 MG tablet Take 50 mg by mouth 3 (three) times daily.    [provider]  hydrocortisone cream 1 % Apply topically as needed for itching. 06/17/16   Baldwin Jamaica, PA-C  lisinopril (PRINIVIL,ZESTRIL) 40 MG tablet Take 40 mg by mouth daily.    [provider]  metFORMIN (GLUCOPHAGE) 1000 MG tablet Take 1,000 mg by mouth 2 (two) times daily with a meal.    [provider]  metoprolol (LOPRESSOR) 50 MG tablet Take 50 mg by mouth 2 (two) times daily.    [provider]  nitroGLYCERIN (NITROSTAT) 0.4 MG SL tablet Place 0.4 mg under the tongue every 5 (five) minutes as needed for chest pain.    [provider]    Family History Family History  Problem Relation Age of  Onset  . Hyperlipidemia Mother   . CAD Father   . Heart attack Father   . Cancer - Ovarian Sister   . Diabetes Maternal Grandmother        blindness  . Heart disease Paternal Grandmother   . Diabetes Paternal Grandfather        with amputation  . Healthy Son   . Obesity Daughter     Social History Social History  Substance Use Topics  . Smoking status: Never Smoker  . Smokeless tobacco: Never Used  . Alcohol use 0.6 oz/week    1 Glasses of wine per week     Allergies   Codeine; Demerol [meperidine]; and Morphine and related   Review of Systems Review of Systems  Gastrointestinal: Positive for abdominal pain.  All other systems reviewed and are negative.    Physical Exam Updated Vital Signs BP (!) 95/55   Pulse (!) 54   Temp 98.4 F (36.9 C) (Oral)   Resp 18   SpO2 97%   Physical Exam  Constitutional: She is oriented to person, place, and time. She appears well-developed and well-nourished. She appears distressed.  Uncomfortable appearing  HENT:  Head: Normocephalic and atraumatic.  Cardiovascular: Normal rate and regular rhythm.   No murmur heard. Pulmonary/Chest: Effort normal and breath sounds normal. No respiratory distress.  Abdominal: Soft. There is no rebound and no guarding.  Mild diffuse abdominal tenderness  Musculoskeletal: She exhibits no edema or tenderness.  Neurological: She is alert and oriented to person, place, and time.  Skin: Skin is warm and dry. There is pallor.  Psychiatric: She has a normal mood and affect. Her behavior is normal.  Nursing note and vitals reviewed.    ED Treatments / Results  Labs (all labs ordered are listed, but only abnormal results are displayed) Labs Reviewed  COMPREHENSIVE METABOLIC PANEL - Abnormal; Notable for the following:       Result Value   Potassium 3.4 (*)    Glucose, Bld 238 (*)    Creatinine, Ser 1.14 (*)    AST 42 (*)    GFR calc non Af Amer 51 (*)    GFR calc Af Amer 59 (*)    All  other components within normal limits  CBC WITH DIFFERENTIAL/PLATELET - Abnormal; Notable for the following:    RBC 5.77 (*)    Hemoglobin 16.2 (*)    HCT 48.6 (*)    All other components within normal limits  I-STAT CHEM 8, ED - Abnormal; Notable for the following:    Potassium 3.4 (*)    BUN 21 (*)    Glucose, Bld 242 (*)  Calcium, Ion 1.09 (*)    Hemoglobin 17.3 (*)    HCT 51.0 (*)    All other components within normal limits  I-STAT CG4 LACTIC ACID, ED - Abnormal; Notable for the following:    Lactic Acid, Venous 2.88 (*)    All other components within normal limits  CBG MONITORING, ED - Abnormal; Notable for the following:    Glucose-Capillary 251 (*)    All other components within normal limits  I-STAT TROPONIN, ED  POC OCCULT BLOOD, ED    EKG  EKG Interpretation None       Radiology No results found.  Procedures Procedures (including critical care time) EMERGENCY DEPARTMENT ULTRASOUND  Study: Limited Retroperitoneal Ultrasound of the Abdominal Aorta.  INDICATIONS:Abnormal vital signs Multiple views of the abdominal aorta were obtained in real-time from the diaphragmatic hiatus to the aortic bifurcation in transverse planes with a multi-frequency probe.  PERFORMED BY: Myself IMAGES ARCHIVED?: Yes LIMITATIONS:  Bowel gas INTERPRETATION:  No abdominal aortic aneurysm    Medications Ordered in ED Medications  lactated ringers bolus 1,000 mL (1,000 mLs Intravenous New Bag/Given 12/22/16 0939)  sodium chloride 0.9 % bolus 1,000 mL (0 mLs Intravenous Stopped 12/22/16 0942)     Initial Impression / Assessment and Plan / ED Course  I have reviewed the triage vital signs and the nursing notes.  Pertinent labs & imaging results that were available during my care of the patient were reviewed by me and considered in my medical decision making (see chart for details).     Patient here for evaluation of abdominal pain, diaphoresis.  She was hypotensive on ED  arrival.  Blood pressure did improve after 2 L of fluid were administered.  CT abdomen is consistent with colitis.  We will send stool cultures.  On repeat assessment she is feeling improved and her blood pressures do appear to be improving.  Hospitalist consulted for observation given her presenting hypotension.  Patient updated of findings of studies and recommendation for admission and she is in agreement with plan.  Final Clinical Impressions(s) / ED Diagnoses   Final diagnoses:  Colitis  Hypotensive episode    New Prescriptions New Prescriptions   No medications on file     Quintella Reichert, MD 12/22/16 1658

## 2016-12-22 NOTE — H&P (Signed)
Patient Demographics:    Brittany Buckley, is a 61 y.o. female  MRN: [id number]   DOB - 1955-08-09  Admit Date - 12/22/2016  Outpatient Primary MD for the patient is Marton Redwood, MD   Assessment & Plan:    Principal Problem:   Acute hypotension Active Problems:   Atrial fibrillation with rapid ventricular response ([place])   Colitis presumed to be due to infection   CAD in native artery   DM2 (diabetes mellitus, type 2) (HCC)  1)Acute Hypotension- suspect secondary to GI fluid losses rather than sepsis, n ED she was found to have systolic blood pressure in the 40J and diastolic blood pressure in the 40s, she was diaphoretic but not tachycardic (takes Multaq and metoprolol), patient received IV fluid boluses with improvement in blood pressure. Hydrate aggressively IV, placed on telemetry monitored unit until hemodynamically more stable.   2)Acute Colitis- presumed infectious, ??? Foot poisoning, patient's husband with similar symptoms, they both ate [place] food on 12/20/2016 p.m, no fevers or leukocytosis, CT abdomen and pelvis suggest pancolitis,  need to rule out C. difficile , Cipro and Flagyl pending GI studies, may discontinue antibiotics if GI studies are negative, may give oral vancomycin if C. difficile test is positive  3)Afib- history of chronic atrial fibrillation, continue Multaq and  Metoprolol (if BP allows) for rate control, continue Pradaxa for  Anticoagulation  4)H/o CAD- no ACS type symptoms, c/n Lipitor, Metoprolol 50 mg bid (if BP allows)  5)DM- hold Metformin and Glipizide until oral intake is more reliable, Use Novolog/Humalog Sliding scale insulin with Accu-Cheks/Fingersticks as ordered   6)HTN- hold scheduled hydralazine and Lasix due to BP and hypovolemia concerns   With History of  - Reviewed by me  Past Medical History:  Diagnosis Date  . A-fib ([place])   . ADD (attention deficit disorder)   . Anginal pain ([place])   . Anxiety   . Arthritis    "hands, feet" (06/14/2016)  . Asthma   . Chronic lower back pain   . Chronic neck pain   . Coronary artery disease   . GERD (gastroesophageal reflux disease)   . Headache    "monthly" (06/14/2016)  . Heart murmur   . Hyperlipidemia   . Hypertension   . MI (myocardial infarction) ([place]) 2014; 2015  . Migraine    "probably twice/year" (06/14/2016)  . Numbness of toes   . Obesity   . On anticoagulant therapy   . Peripheral neuropathy   . Pneumonia 1990s  . Thyroid nodule   . Type II diabetes mellitus ([place])   . Urticaria       Past Surgical History:  Procedure Laterality Date  . ABDOMINAL HYSTERECTOMY    . BIOPSY THYROID    . CESAREAN SECTION    . CORONARY ANGIOPLASTY WITH STENT PLACEMENT  2014 & 2015  . TONSILLECTOMY AND ADENOIDECTOMY    . TRIGGER FINGER RELEASE Right    "pointer"  Chief Complaint  Patient presents with  . Abdominal Pain  . Hypotension      HPI:    Brittany Buckley  is a 61 y.o. female, with past medical history relevant for hypertension and diabetes, atrial fibrillation currently on anticoagulation as well as history of CAD with previous stents who presents to the ED with concerns of abdominal pain and diarrhea.  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 She was diaphoretic, nauseous but no emesis.  Stools are without blood or mucus, no frank chest pain, patient was very dizzy and felt like she might pass out. No fevers no chills.  Patient's husband is at bedside, additional history obtained from him  In ED she was found to have systolic blood pressure in the 91Y and diastolic blood pressure in the 40s, she was diaphoretic but not tachycardic (takes Multaq and metoprolol), patient received IV fluid boluses with improvement in blood pressure.  CT abdomen and pelvis suggest pancolitis need to rule out C. difficile , patient without fevers or leukocytosis  In and out Foley catheter done due to inability to urinate with about 1 Liter of clear urine out     Review of systems:    In addition to the HPI above,   A full 12 point Review of 10 Systems was done, except as stated above, all other Review of 10 Systems were negative.    Social History:  Reviewed by me    Social History  Substance Use Topics  . Smoking status: Never Smoker  . Smokeless tobacco: Never Used  . Alcohol use 0.6 oz/week    1 Glasses of wine per week     Family History :  Reviewed by me   Family History  Problem Relation Age of Onset  . Hyperlipidemia Mother   . CAD Father   . Heart attack Father   . Cancer - Ovarian Sister   . Diabetes Maternal Grandmother        blindness  . Heart disease Paternal Grandmother   . Diabetes Paternal Grandfather        with amputation  . Healthy Son   . Obesity Daughter     Home Medications:   Prior to Admission medications   Medication Sig Start Date End Date Taking? Authorizing Provider  albuterol (PROVENTIL HFA;VENTOLIN HFA) 108 (90 Base) MCG/ACT inhaler Inhale 1-2 puffs into the lungs every 6 (six) hours as needed for wheezing or shortness of breath.    [provider]  atorvastatin (LIPITOR) 80 MG tablet Take 80 mg by mouth daily.    [provider]  dabigatran (PRADAXA) 150 MG CAPS capsule Take 150 mg by mouth 2 (two) times daily.    [provider]  dronedarone (MULTAQ) 400 MG tablet Take 1 tablet (400 mg total) by mouth 2 (two) times daily with a meal. 06/17/16    Baldwin Jamaica, PA-C  escitalopram (LEXAPRO) 10 MG tablet Take 10 mg by mouth daily.    [provider]  furosemide (LASIX) 40 MG tablet Take 40 mg by mouth daily.    [provider]  glipiZIDE (GLUCOTROL) 5 MG tablet Take by  mouth 2 (two) times daily before a meal.     [provider]  hydrALAZINE (APRESOLINE) 50 MG tablet Take 50 mg by mouth 3 (three) times daily.    [provider]  hydrocortisone cream 1 % Apply topically as needed for itching. 06/17/16   Baldwin Jamaica, PA-C  lisinopril (PRINIVIL,ZESTRIL) 40 MG tablet Take 40 mg by mouth daily.    [provider]  metFORMIN (GLUCOPHAGE) 1000 MG tablet Take 1,000 mg by mouth 2 (two) times daily with a meal.    [provider]  metoprolol (LOPRESSOR) 50 MG tablet Take 50 mg by mouth 2 (two) times daily.    [provider]  nitroGLYCERIN (NITROSTAT) 0.4 MG SL tablet Place 0.4 mg under the tongue every 5 (five) minutes as needed for chest pain.    [provider]     Allergies:     Allergies  Allergen Reactions  . Codeine Hives    ONLY IN COUGH SYRUP  . Demerol [Meperidine] Rash  . Morphine And Related Rash     Physical Exam:   Vitals  Blood pressure 140/79, pulse 72, temperature 98.4 F (36.9 C), temperature source Oral, resp. rate 13, SpO2 97 %.  Physical Examination: General appearance - alert, well appearing, and in no distress  Mental status - alert, oriented to person, place, and time,  Eyes - sclera anicteric Neck - supple, no JVD elevation , Chest - clear  to auscultation bilaterally, symmetrical air movement,  Heart - S1 and S2 normal, irregularly irregular, heart rate 84 Abdomen - soft, nondistended, no CVA area tenderness, generalized lower abdominal tenderness worse on the right without rebound or guarding Neurological - screening mental status exam normal, neck supple without rigidity, cranial nerves II through XII intact, DTR's normal and  symmetric Extremities - no pedal edema noted, intact peripheral pulses  Skin - diaphoretic earlier   Data Review:    CBC  Recent Labs Lab 12/22/16 0858 12/22/16 0909  WBC 8.4  --   HGB 16.2* 17.3*  HCT 48.6* 51.0*  PLT 251  --   MCV 84.2  --   MCH 28.1  --   MCHC 33.3  --   RDW 14.6  --   LYMPHSABS 2.7  --   MONOABS 0.4  --   EOSABS 0.3  --   BASOSABS 0.0  --    ------------------------------------------------------------------------------------------------------------------  Chemistries   Recent Labs Lab 12/22/16 0858 12/22/16 0909  NA 138 140  K 3.4* 3.4*  CL 101 103  CO2 23  --   GLUCOSE 238* 242*  BUN 17 21*  CREATININE 1.14* 1.00  CALCIUM 9.5  --   AST 42*  --   ALT 43  --   ALKPHOS 85  --   BILITOT 1.0  --    ------------------------------------------------------------------------------------------------------------------ CrCl cannot be calculated (Unknown ideal weight.). ------------------------------------------------------------------------------------------------------------------ No results for input(s): TSH, T4TOTAL, T3FREE, THYROIDAB in the last 72 hours.  Invalid input(s): FREET3   Coagulation profile No results for input(s): INR, PROTIME in the last 168 hours. ------------------------------------------------------------------------------------------------------------------- No results for input(s): DDIMER in the last 72 hours. -------------------------------------------------------------------------------------------------------------------  Cardiac Enzymes No results for input(s): CKMB, TROPONINI, MYOGLOBIN in the last 168 hours.  Invalid input(s): CK ------------------------------------------------------------------------------------------------------------------ No results found for: BNP   ---------------------------------------------------------------------------------------------------------------  Urinalysis No results found  for: COLORURINE, APPEARANCEUR, LABSPEC, PHURINE, GLUCOSEU, HGBUR, BILIRUBINUR, KETONESUR, PROTEINUR, UROBILINOGEN, NITRITE, LEUKOCYTESUR  ----------------------------------------------------------------------------------------------------------------   Imaging Results:    Ct Abdomen Pelvis W Contrast  Result Date:  12/22/2016 CLINICAL DATA:  Abdominal pain.  Prior hysterectomy. EXAM: CT ABDOMEN AND PELVIS WITH CONTRAST TECHNIQUE: Multidetector CT imaging of the abdomen and pelvis was performed using the standard protocol following bolus administration of intravenous contrast. CONTRAST:  121mL ISOVUE-300 IOPAMIDOL (ISOVUE-300) INJECTION 61% COMPARISON:  06/30/2016 abdominal sonogram. FINDINGS: Lower chest: No significant pulmonary nodules or acute consolidative airspace disease. Coronary atherosclerosis. Hepatobiliary: Normal liver size. No liver mass. Normal gallbladder with no radiopaque cholelithiasis. No biliary ductal dilatation. Pancreas: Normal, with no mass or duct dilation. Spleen: Normal size. No mass. Adrenals/Urinary Tract: Normal adrenals. Subcentimeter hypodense renal cortical lesion in the anterior lower right kidney, too small to characterize, for which no follow-up is required. No additional renal lesions. No hydronephrosis. Normal bladder. Stomach/Bowel: Grossly normal stomach. Normal caliber small bowel with no small bowel wall thickening. Normal appendix. There is fluid filling the entire colon. There is mild diffuse colonic wall thickening with mild pericolonic fat stranding, most prominent in the descending colon. Mild sigmoid diverticulosis. Vascular/Lymphatic: Atherosclerotic nonaneurysmal abdominal aorta. Patent portal, splenic, hepatic and renal veins. No pathologically enlarged lymph nodes in the abdomen or pelvis. Reproductive: Status post hysterectomy, with no abnormal findings at the vaginal cuff. No adnexal mass. Other: No pneumoperitoneum, ascites or focal fluid collection.  Musculoskeletal: No aggressive appearing focal osseous lesions. Mild thoracolumbar spondylosis. IMPRESSION: 1. Spectrum of CT findings most compatible with a nonspecific infectious or inflammatory pancolitis, with the differential including C. diff colitis. 2. Mild sigmoid diverticulosis. The CT findings are not compatible with acute diverticulitis. 3.  Aortic Atherosclerosis (ICD10-I70.0).  Coronary atherosclerosis. Electronically Signed   By: Ilona Sorrel M.D.   On: 12/22/2016 11:24    Radiological Exams on Admission: Ct Abdomen Pelvis W Contrast  Result Date: 12/22/2016 CLINICAL DATA:  Abdominal pain.  Prior hysterectomy. EXAM: CT ABDOMEN AND PELVIS WITH CONTRAST TECHNIQUE: Multidetector CT imaging of the abdomen and pelvis was performed using the standard protocol following bolus administration of intravenous contrast. CONTRAST:  120mL ISOVUE-300 IOPAMIDOL (ISOVUE-300) INJECTION 61% COMPARISON:  06/30/2016 abdominal sonogram. FINDINGS: Lower chest: No significant pulmonary nodules or acute consolidative airspace disease. Coronary atherosclerosis. Hepatobiliary: Normal liver size. No liver mass. Normal gallbladder with no radiopaque cholelithiasis. No biliary ductal dilatation. Pancreas: Normal, with no mass or duct dilation. Spleen: Normal size. No mass. Adrenals/Urinary Tract: Normal adrenals. Subcentimeter hypodense renal cortical lesion in the anterior lower right kidney, too small to characterize, for which no follow-up is required. No additional renal lesions. No hydronephrosis. Normal bladder. Stomach/Bowel: Grossly normal stomach. Normal caliber small bowel with no small bowel wall thickening. Normal appendix. There is fluid filling the entire colon. There is mild diffuse colonic wall thickening with mild pericolonic fat stranding, most prominent in the descending colon. Mild sigmoid diverticulosis. Vascular/Lymphatic: Atherosclerotic nonaneurysmal abdominal aorta. Patent portal, splenic, hepatic  and renal veins. No pathologically enlarged lymph nodes in the abdomen or pelvis. Reproductive: Status post hysterectomy, with no abnormal findings at the vaginal cuff. No adnexal mass. Other: No pneumoperitoneum, ascites or focal fluid collection. Musculoskeletal: No aggressive appearing focal osseous lesions. Mild thoracolumbar spondylosis. IMPRESSION: 1. Spectrum of CT findings most compatible with a nonspecific infectious or inflammatory pancolitis, with the differential including C. diff colitis. 2. Mild sigmoid diverticulosis. The CT findings are not compatible with acute diverticulitis. 3.  Aortic Atherosclerosis (ICD10-I70.0).  Coronary atherosclerosis. Electronically Signed   By: Ilona Sorrel M.D.   On: 12/22/2016 11:24    DVT Prophylaxis -SCD  Pradaxa AM Labs Ordered, also please review Full Orders  Family Communication: Admission, patients condition and plan of care including tests being ordered have been discussed with the patient and husband who indicate understanding and agree with the plan   Code Status - Full Code  Likely DC to  home  Condition   Stable  Ardit Danh M.D on 12/22/2016 at 1:03 PM   Between 7am to 7pm - Pager - [phone] After 7pm go to [email or web address] - password [email or web address]  [facility] - Office  [phone]  Voice Recognition Viviann Spare dictation system was used to create this note, attempts have been made to correct errors. Please contact the author with questions and/or clarifications.

## 2016-12-22 NOTE — ED Notes (Signed)
Hospitalist Courage bedside at this time

## 2016-12-22 NOTE — ED Triage Notes (Addendum)
Pt here from home with complaint of abdominal pain. She states she had 2 strenuous bowel movements PTA. She is AOX4. Skin dry at this time but clothing wet from pt being diaphoretic. She was hypotensive in triage with a systolic in the 10F.

## 2016-12-23 DIAGNOSIS — K529 Noninfective gastroenteritis and colitis, unspecified: Principal | ICD-10-CM

## 2016-12-23 LAB — BASIC METABOLIC PANEL
Anion gap: 8 (ref 5–15)
BUN: 8 mg/dL (ref 6–20)
CHLORIDE: 108 mmol/L (ref 101–111)
CO2: 23 mmol/L (ref 22–32)
CREATININE: 0.7 mg/dL (ref 0.44–1.00)
Calcium: 8.4 mg/dL — ABNORMAL LOW (ref 8.9–10.3)
GFR calc Af Amer: >60 mL/min (ref >60)
GFR calc non Af Amer: >60 mL/min (ref >60)
Glucose, Bld: 123 mg/dL — ABNORMAL HIGH (ref 65–99)
Potassium: 3.3 mmol/L — ABNORMAL LOW (ref 3.5–5.1)
Sodium: 139 mmol/L (ref 135–145)

## 2016-12-23 LAB — CBC
HEMATOCRIT: 36.8 % (ref 36.0–46.0)
Hemoglobin: 12 g/dL (ref 12.0–15.0)
MCH: 27.7 pg (ref 26.0–34.0)
MCHC: 32.6 g/dL (ref 30.0–36.0)
MCV: 85 fL (ref 78.0–100.0)
PLATELETS: 164 10*3/uL (ref 150–400)
RBC: 4.33 MIL/uL (ref 3.87–5.11)
RDW: 14.9 % (ref 11.5–15.5)
WBC: 6.1 10*3/uL (ref 4.0–10.5)

## 2016-12-23 LAB — GLUCOSE, CAPILLARY
Glucose-Capillary: 134 mg/dL — ABNORMAL HIGH (ref 65–99)
Glucose-Capillary: 138 mg/dL — ABNORMAL HIGH (ref 65–99)
Glucose-Capillary: 136 mg/dL — ABNORMAL HIGH (ref 65–99)
Glucose-Capillary: 138 mg/dL — ABNORMAL HIGH (ref 65–99)

## 2016-12-23 LAB — HIV ANTIBODY (ROUTINE TESTING W REFLEX): HIV Screen 4th Generation wRfx: NONREACTIVE

## 2016-12-23 MED ORDER — HYDRALAZINE HCL 20 MG/ML IJ SOLN
10.0000 mg | INTRAMUSCULAR | Status: DC | PRN
  Administered 2016-12-23 – 2016-12-24 (×2): 10 mg via INTRAVENOUS
  Filled 2016-12-23 (×2): qty 1

## 2016-12-23 MED ORDER — METOPROLOL TARTRATE 25 MG PO TABS
25.0000 mg | ORAL_TABLET | Freq: Two times a day (BID) | ORAL | Status: DC
  Administered 2016-12-23 – 2016-12-24 (×3): 25 mg via ORAL
  Filled 2016-12-23 (×3): qty 1

## 2016-12-23 MED ORDER — POTASSIUM CHLORIDE CRYS ER 20 MEQ PO TBCR
40.0000 meq | EXTENDED_RELEASE_TABLET | Freq: Once | ORAL | Status: AC
  Administered 2016-12-23: 40 meq via ORAL
  Filled 2016-12-23: qty 2

## 2016-12-23 MED ORDER — DULOXETINE HCL 60 MG PO CPEP
60.0000 mg | ORAL_CAPSULE | Freq: Every day | ORAL | Status: DC
  Administered 2016-12-23 – 2016-12-24 (×2): 60 mg via ORAL
  Filled 2016-12-23 (×2): qty 1

## 2016-12-23 NOTE — Progress Notes (Signed)
PROGRESS NOTE    Brittany Buckley  WFU:[id number] DOB: 1955/10/29 DOA: 12/22/2016 PCP: Marton Redwood, MD     Brief Narrative:  Brittany Buckley is a 61 yo female with past medical history of hypertension, diabetes, a fib, CAD who presents with chief complaint of abdominal pain and diarrhea. She started to have nausea, abdominal pain, multiple bowel movements. She states that she and her husband had [place] food on 10/23, her husband has also had 10 bowel movements on 10/24. Patient's own symptoms started 10/25. In the emergency department, she had systolic blood pressure in the 60s. She underwent CT abdomen pelvis which suggested pancolitis.  Assessment & Plan:   Principal Problem:   Acute hypotension Active Problems:   Atrial fibrillation with rapid ventricular response (H[facility])   Colitis presumed to be due to infection   CAD in native artery   DM2 (diabetes mellitus, type 2) (H[facility])   Pancolitis -Improving symptomatically -GI PCR, C Diff pending -Empiric cipro/flagyl   Hypotension -Resolved with IVF. Now hypertensive  Hypokalemia -Due to diarrhea. Replace, trend   HTN -Continue metoprolol, lisinopril  -IV hydralazine prn   A Fib -Continue multaq, metoprolol, pradaxa   HLD -Continue lipitor  DM  -Hold home metformin, glipizide -SSI  Depression/anxiety  -Continue cymbalta, trazodone     DVT prophylaxis: pradaxa Code Status: full Family Communication: No family at bedside Disposition Plan: Pending improvement, home tomorrow if continued to improve   Consultants:   None  Procedures:   None   Antimicrobials:  Anti-infectives    Start     Dose/Rate Route Frequency Ordered Stop   12/23/16 0000  metroNIDAZOLE (FLAGYL) IVPB 500 mg     500 mg 100 mL/hr over 60 Minutes Intravenous Every 8 hours 12/22/16 2028     12/22/16 1230  ciprofloxacin (CIPRO) IVPB 400 mg     400 mg 200 mL/hr over 60 Minutes Intravenous Every 12 hours 12/22/16 1217     12/22/16 1230   metroNIDAZOLE (FLAGYL) IVPB 500 mg  Status:  Discontinued     500 mg 100 mL/hr over 60 Minutes Intravenous Every 8 hours 12/22/16 1217 12/22/16 2028       Subjective: Patient doing well this morning. No further diarrhea or bowel movement since emergency department. She admits to feeling that her blood pressure is elevated. She has some lower abdominal soreness which has now also improved. Denies any other issues such as fevers or chills, chest pain or shortness of breath. Wants to eat some solid food today.  Objective: Vitals:   12/23/16 0514 12/23/16 0854 12/23/16 1018 12/23/16 1022  BP: (!) 176/68 (!) 185/77 (!) 178/102   Pulse: 65   68  Resp: 18     Temp: 97.7 F (36.5 C)     TempSrc: Oral     SpO2: 96%     Weight:      Height:        Intake/Output Summary (Last 24 hours) at 12/23/16 1425 Last data filed at 12/23/16 1020  Gross per 24 hour  Intake             2200 ml  Output             2100 ml  Net              100 ml   Filed Weights   12/22/16 2244  Weight: 96.6 kg (213 lb)    Examination:  General exam: Appears calm and comfortable  Respiratory system: Clear to auscultation. Respiratory effort  normal. Cardiovascular system: S1 & S2 heard. No JVD, murmurs, rubs, gallops or clicks. No pedal edema. Gastrointestinal system: Abdomen is nondistended, soft and mildly TTP bilateral lower quadrants. No organomegaly or masses felt. Normal bowel sounds heard. Central nervous system: Alert and oriented. No focal neurological deficits. Extremities: Symmetric 5 x 5 power. Skin: No rashes, lesions or ulcers Psychiatry: Judgement and insight appear normal. Mood & affect appropriate.   Data Reviewed: I have personally reviewed following labs and imaging studies  CBC:  Recent Labs Lab 12/22/16 0858 12/22/16 0909 12/23/16 0652  WBC 8.4  --  6.1  NEUTROABS 5.0  --   --   HGB 16.2* 17.3* 12.0  HCT 48.6* 51.0* 36.8  MCV 84.2  --  85.0  PLT 251  --  962   Basic Metabolic  Panel:  Recent Labs Lab 12/22/16 0858 12/22/16 0909 12/23/16 0652  NA 138 140 139  K 3.4* 3.4* 3.3*  CL 101 103 108  CO2 23  --  23  GLUCOSE 238* 242* 123*  BUN 17 21* 8  CREATININE 1.14* 1.00 0.70  CALCIUM 9.5  --  8.4*   GFR: Estimated Creatinine Clearance: 87.6 mL/min (by C-G formula based on SCr of 0.7 mg/dL). Liver Function Tests:  Recent Labs Lab 12/22/16 0858  AST 42*  ALT 43  ALKPHOS 85  BILITOT 1.0  PROT 7.6  ALBUMIN 4.4   No results for input(s): LIPASE, AMYLASE in the last 168 hours. No results for input(s): AMMONIA in the last 168 hours. Coagulation Profile: No results for input(s): INR, PROTIME in the last 168 hours. Cardiac Enzymes: No results for input(s): CKTOTAL, CKMB, CKMBINDEX, TROPONINI in the last 168 hours. BNP (last 3 results) No results for input(s): PROBNP in the last 8760 hours. HbA1C: No results for input(s): HGBA1C in the last 72 hours. CBG:  Recent Labs Lab 12/22/16 0857 12/22/16 1809 12/22/16 2040 12/23/16 0833 12/23/16 1221  GLUCAP 251* 105* 78 134* 138*   Lipid Profile: No results for input(s): CHOL, HDL, LDLCALC, TRIG, CHOLHDL, LDLDIRECT in the last 72 hours. Thyroid Function Tests: No results for input(s): TSH, T4TOTAL, FREET4, T3FREE, THYROIDAB in the last 72 hours. Anemia Panel: No results for input(s): VITAMINB12, FOLATE, FERRITIN, TIBC, IRON, RETICCTPCT in the last 72 hours. Sepsis Labs:  Recent Labs Lab 12/22/16 0910 12/22/16 1334  LATICACIDVEN 2.88* 1.64    No results found for this or any previous visit (from the past 240 hour(s)).     Radiology Studies: Ct Abdomen Pelvis W Contrast  Result Date: 12/22/2016 CLINICAL DATA:  Abdominal pain.  Prior hysterectomy. EXAM: CT ABDOMEN AND PELVIS WITH CONTRAST TECHNIQUE: Multidetector CT imaging of the abdomen and pelvis was performed using the standard protocol following bolus administration of intravenous contrast. CONTRAST:  167mL ISOVUE-300 IOPAMIDOL  (ISOVUE-300) INJECTION 61% COMPARISON:  06/30/2016 abdominal sonogram. FINDINGS: Lower chest: No significant pulmonary nodules or acute consolidative airspace disease. Coronary atherosclerosis. Hepatobiliary: Normal liver size. No liver mass. Normal gallbladder with no radiopaque cholelithiasis. No biliary ductal dilatation. Pancreas: Normal, with no mass or duct dilation. Spleen: Normal size. No mass. Adrenals/Urinary Tract: Normal adrenals. Subcentimeter hypodense renal cortical lesion in the anterior lower right kidney, too small to characterize, for which no follow-up is required. No additional renal lesions. No hydronephrosis. Normal bladder. Stomach/Bowel: Grossly normal stomach. Normal caliber small bowel with no small bowel wall thickening. Normal appendix. There is fluid filling the entire colon. There is mild diffuse colonic wall thickening with mild pericolonic fat stranding, most prominent  in the descending colon. Mild sigmoid diverticulosis. Vascular/Lymphatic: Atherosclerotic nonaneurysmal abdominal aorta. Patent portal, splenic, hepatic and renal veins. No pathologically enlarged lymph nodes in the abdomen or pelvis. Reproductive: Status post hysterectomy, with no abnormal findings at the vaginal cuff. No adnexal mass. Other: No pneumoperitoneum, ascites or focal fluid collection. Musculoskeletal: No aggressive appearing focal osseous lesions. Mild thoracolumbar spondylosis. IMPRESSION: 1. Spectrum of CT findings most compatible with a nonspecific infectious or inflammatory pancolitis, with the differential including C. diff colitis. 2. Mild sigmoid diverticulosis. The CT findings are not compatible with acute diverticulitis. 3.  Aortic Atherosclerosis (ICD10-I70.0).  Coronary atherosclerosis. Electronically Signed   By: Ilona Sorrel M.D.   On: 12/22/2016 11:24      Scheduled Meds: . atorvastatin  80 mg Oral Daily  . dabigatran  150 mg Oral BID  . dronedarone  400 mg Oral BID WC  . DULoxetine   60 mg Oral Daily  . insulin aspart  0-9 Units Subcutaneous TID WC  . lisinopril  40 mg Oral Daily  . metoprolol tartrate  25 mg Oral BID  . sodium chloride flush  3 mL Intravenous Q12H   Continuous Infusions: . sodium chloride    . sodium chloride 150 mL/hr at 12/23/16 0852  . ciprofloxacin 400 mg (12/23/16 1230)  . metronidazole Stopped (12/23/16 0953)     LOS: 1 day    Time spent: 40 minutes   Dessa Phi, DO [facility] [email or web address] Password [email or web address] 12/23/2016, 2:25 PM

## 2016-12-24 LAB — BASIC METABOLIC PANEL
Anion gap: 8 (ref 5–15)
BUN: 6 mg/dL (ref 6–20)
CALCIUM: 8.5 mg/dL — AB (ref 8.9–10.3)
CO2: 24 mmol/L (ref 22–32)
CREATININE: 0.65 mg/dL (ref 0.44–1.00)
Chloride: 108 mmol/L (ref 101–111)
Glucose, Bld: 137 mg/dL — ABNORMAL HIGH (ref 65–99)
Potassium: 3.4 mmol/L — ABNORMAL LOW (ref 3.5–5.1)
SODIUM: 140 mmol/L (ref 135–145)

## 2016-12-24 LAB — GLUCOSE, CAPILLARY
GLUCOSE-CAPILLARY: 130 mg/dL — AB (ref 65–99)
Glucose-Capillary: 170 mg/dL — ABNORMAL HIGH (ref 65–99)

## 2016-12-24 LAB — CBC
HCT: 38.2 % (ref 36.0–46.0)
Hemoglobin: 12.3 g/dL (ref 12.0–15.0)
MCH: 27.5 pg (ref 26.0–34.0)
MCHC: 32.2 g/dL (ref 30.0–36.0)
MCV: 85.3 fL (ref 78.0–100.0)
PLATELETS: 176 10*3/uL (ref 150–400)
RBC: 4.48 MIL/uL (ref 3.87–5.11)
RDW: 14.6 % (ref 11.5–15.5)
WBC: 6 10*3/uL (ref 4.0–10.5)

## 2016-12-24 LAB — MAGNESIUM: MAGNESIUM: 1.6 mg/dL — AB (ref 1.7–2.4)

## 2016-12-24 MED ORDER — CIPROFLOXACIN HCL 500 MG PO TABS: 500.0000 mg | ORAL_TABLET | Freq: Two times a day (BID) | ORAL | 0 refills | Status: AC

## 2016-12-24 MED ORDER — METRONIDAZOLE 500 MG PO TABS: 500.0000 mg | ORAL_TABLET | Freq: Three times a day (TID) | ORAL | 0 refills | Status: AC

## 2016-12-24 MED ORDER — POTASSIUM CHLORIDE CRYS ER 20 MEQ PO TBCR
40.0000 meq | EXTENDED_RELEASE_TABLET | Freq: Once | ORAL | Status: AC
  Administered 2016-12-24: 40 meq via ORAL
  Filled 2016-12-24: qty 2

## 2016-12-24 MED ORDER — MAGNESIUM SULFATE 2 GM/50ML IV SOLN
2.0000 g | Freq: Once | INTRAVENOUS | Status: AC
  Administered 2016-12-24: 2 g via INTRAVENOUS
  Filled 2016-12-24: qty 50

## 2016-12-24 NOTE — Discharge Instructions (Signed)
Colitis Colitis is inflammation of the colon. Colitis may last a short time (acute) or it may last a long time (chronic). What are the causes? This condition may be caused by:  Viruses.  Bacteria.  Reactions to medicine.  Certain autoimmune diseases, such as Crohn disease or ulcerative colitis.  What are the signs or symptoms? Symptoms of this condition include:  Diarrhea.  Passing bloody or tarry stool.  Pain.  Fever.  Vomiting.  Tiredness (fatigue).  Weight loss.  Bloating.  Sudden increase in abdominal pain.  Having fewer bowel movements than usual.  How is this diagnosed? This condition is diagnosed with a stool test or a blood test. You may also have other tests, including X-rays, a CT scan, or a colonoscopy. How is this treated? Treatment may include:  Resting the bowel. This involves not eating or drinking for a period of time.  Fluids that are given through an IV tube.  Medicine for pain and diarrhea.  Antibiotic medicines.  Cortisone medicines.  Surgery.  Follow these instructions at home: Eating and drinking  Follow instructions from your health care provider about eating or drinking restrictions.  Drink enough fluid to keep your urine clear or pale yellow.  Work with a dietitian to determine which foods cause your condition to flare up.  Avoid foods that cause flare-ups.  Eat a well-balanced diet. Medicines  Take over-the-counter and prescription medicines only as told by your health care provider.  If you were prescribed an antibiotic medicine, take it as told by your health care provider. Do not stop taking the antibiotic even if you start to feel better. General instructions  Keep all follow-up visits as told by your health care provider. This is important. Contact a health care provider if:  Your symptoms do not go away.  You develop new symptoms. Get help right away if:  You have a fever that does not go away with  treatment.  You develop chills.  You have extreme weakness, fainting, or dehydration.  You have repeated vomiting.  You develop severe pain in your abdomen.  You pass bloody or tarry stool. This information is not intended to replace advice given to you by your health care provider. Make sure you discuss any questions you have with your health care provider. Document Released: 03/24/2004 Document Revised: 07/23/2015 Document Reviewed: 06/09/2014 Elsevier Interactive Patient Education  2018 Elsevier Inc.  

## 2016-12-24 NOTE — Discharge Summary (Signed)
Physician Discharge Summary  [name] MVH:[id number] DOB: 09-Feb-1956 DOA: 12/22/2016  PCP: Marton Redwood, MD  Admit date: 12/22/2016 Discharge date: 12/24/2016  Admitted From: Home Disposition:  Home  Recommendations for Outpatient Follow-up:  1. Follow up with PCP in 1 week 2. Please obtain BMP/Mg in 1 week   Discharge Condition: Stable CODE STATUS: Full  Diet recommendation: Heart healthy   Brief/Interim Summary: Brittany Buckley is a 61 yo female with past medical history of hypertension, diabetes, a fib, CAD who presents with chief complaint of abdominal pain and diarrhea. She started to have nausea, abdominal pain, multiple bowel movements. She states that she and her husband had [place] food on 10/23, her husband has also had 10 bowel movements on 10/24. Patient's own symptoms started 10/25. In the emergency department, she had systolic blood pressure in the 60s. BP improved with IVF. She underwent CT abdomen pelvis which suggested pancolitis. She had no further episodes of diarrhea > 24 hours and stool sample was unable to be obtained. She had symptomatic improvement of colitis.   Discharge Diagnoses:  Principal Problem:   Acute hypotension Active Problems:   Atrial fibrillation with rapid ventricular response (HCC)   Colitis presumed to be due to infection   CAD in native artery   DM2 (diabetes mellitus, type 2) (HCC)   Pancolitis -Improving symptomatically -GI PCR, C Diff unable to obtain, no further diarrhea or BM > 24 hours  -Empiric cipro/flagyl   Hypotension -Resolved with IVF. Stable   Hypokalemia -Due to diarrhea. Replace, trend   Hypomagnesemia -Replace, trend   HTN -Continue metoprolol, lisinopril, hydralazine, lasix   A Fib -Continue multaq, metoprolol, pradaxa.  HLD -Continue lipitor  DM  -Hold home metformin, glipizide. Resume at discharge  -SSI  Depression/anxiety  -Continue cymbalta   Discharge  Instructions  Discharge Instructions    Call MD for:  difficulty breathing, headache or visual disturbances    Complete by:  As directed    Call MD for:  extreme fatigue    Complete by:  As directed    Call MD for:  hives    Complete by:  As directed    Call MD for:  persistant dizziness or light-headedness    Complete by:  As directed    Call MD for:  persistant nausea and vomiting    Complete by:  As directed    Call MD for:  severe uncontrolled pain    Complete by:  As directed    Call MD for:  temperature >100.4    Complete by:  As directed    Diet - low sodium heart healthy    Complete by:  As directed    Discharge instructions    Complete by:  As directed    You were cared for by a hospitalist during your hospital stay. If you have any questions about your discharge medications or the care you received while you were in the hospital after you are discharged, you can call the unit and asked to speak with the hospitalist on call if the hospitalist that took care of you is not available. Once you are discharged, your primary care physician will handle any further medical issues. Please note that NO REFILLS for any discharge medications will be authorized once you are discharged, as it is imperative that you return to your primary care physician (or establish a relationship with a primary care physician if you do not have one) for your aftercare needs so that they can reassess your  need for medications and monitor your lab values.   Increase activity slowly    Complete by:  As directed      Allergies as of 12/24/2016      Reactions   Codeine Hives   ONLY IN COUGH SYRUP   Onion Other (See Comments)   Runny nose and chest/nasal congestion (IF RAW)   Tomato Other (See Comments)   Runny nose and chest/nasal congestion (IF RAW)   Chocolate Rash   Demerol [meperidine] Rash   Morphine And Related Rash      Medication List    TAKE these medications   albuterol 108 (90 Base)  MCG/ACT inhaler Commonly known as:  PROVENTIL HFA;VENTOLIN HFA Inhale 1-2 puffs into the lungs every 6 (six) hours as needed for wheezing or shortness of breath.   atorvastatin 80 MG tablet Commonly known as:  LIPITOR Take 80 mg by mouth daily.   ciprofloxacin 500 MG tablet Commonly known as:  CIPRO Take 1 tablet (500 mg total) by mouth 2 (two) times daily.   dabigatran 150 MG Caps capsule Commonly known as:  PRADAXA Take 150 mg by mouth 2 (two) times daily.   dronedarone 400 MG tablet Commonly known as:  MULTAQ Take 1 tablet (400 mg total) by mouth 2 (two) times daily with a meal.   DULoxetine 60 MG capsule Commonly known as:  CYMBALTA Take 60 mg by mouth daily.   furosemide 40 MG tablet Commonly known as:  LASIX Take 40 mg by mouth daily.   glipiZIDE 5 MG tablet Commonly known as:  GLUCOTROL Take 5 mg by mouth 2 (two) times daily before a meal.   hydrALAZINE 50 MG tablet Commonly known as:  APRESOLINE Take 50 mg by mouth 2 (two) times daily.   hydrocortisone cream 1 % Apply topically as needed for itching.   lisinopril 40 MG tablet Commonly known as:  PRINIVIL,ZESTRIL Take 40 mg by mouth daily.   metFORMIN 1000 MG tablet Commonly known as:  GLUCOPHAGE Take 1,000 mg by mouth 2 (two) times daily with a meal.   metoprolol tartrate 50 MG tablet Commonly known as:  LOPRESSOR Take 50 mg by mouth 2 (two) times daily.   metroNIDAZOLE 500 MG tablet Commonly known as:  FLAGYL Take 1 tablet (500 mg total) by mouth 3 (three) times daily.   MULTIVITAMIN GUMMIES ADULTS Chew Chew 1 tablet by mouth daily.   nitroGLYCERIN 0.4 MG SL tablet Commonly known as:  NITROSTAT Place 0.4 mg under the tongue every 5 (five) minutes as needed for chest pain.   VITAMIN B-12 PO Take 1 tablet by mouth daily.      Follow-up Information    Marton Redwood, MD. Schedule an appointment as soon as possible for a visit in 1 week(s).   Specialty:  Internal Medicine Why:  Need repeat BMP  and Mg  Contact information: [address]  [place] [address] [phone]          Allergies  Allergen Reactions  . Codeine Hives    ONLY IN COUGH SYRUP  . Onion Other (See Comments)    Runny nose and chest/nasal congestion (IF RAW)  . Tomato Other (See Comments)    Runny nose and chest/nasal congestion (IF RAW)  . Chocolate Rash  . Demerol [Meperidine] Rash  . Morphine And Related Rash    Consultations:  None   Procedures/Studies: Ct Abdomen Pelvis W Contrast  Result Date: 12/22/2016 CLINICAL DATA:  Abdominal pain.  Prior hysterectomy. EXAM: CT ABDOMEN AND PELVIS  WITH CONTRAST TECHNIQUE: Multidetector CT imaging of the abdomen and pelvis was performed using the standard protocol following bolus administration of intravenous contrast. CONTRAST:  136mL ISOVUE-300 IOPAMIDOL (ISOVUE-300) INJECTION 61% COMPARISON:  06/30/2016 abdominal sonogram. FINDINGS: Lower chest: No significant pulmonary nodules or acute consolidative airspace disease. Coronary atherosclerosis. Hepatobiliary: Normal liver size. No liver mass. Normal gallbladder with no radiopaque cholelithiasis. No biliary ductal dilatation. Pancreas: Normal, with no mass or duct dilation. Spleen: Normal size. No mass. Adrenals/Urinary Tract: Normal adrenals. Subcentimeter hypodense renal cortical lesion in the anterior lower right kidney, too small to characterize, for which no follow-up is required. No additional renal lesions. No hydronephrosis. Normal bladder. Stomach/Bowel: Grossly normal stomach. Normal caliber small bowel with no small bowel wall thickening. Normal appendix. There is fluid filling the entire colon. There is mild diffuse colonic wall thickening with mild pericolonic fat stranding, most prominent in the descending colon. Mild sigmoid diverticulosis. Vascular/Lymphatic: Atherosclerotic nonaneurysmal abdominal aorta. Patent portal, splenic, hepatic and renal veins. No pathologically enlarged lymph nodes in  the abdomen or pelvis. Reproductive: Status post hysterectomy, with no abnormal findings at the vaginal cuff. No adnexal mass. Other: No pneumoperitoneum, ascites or focal fluid collection. Musculoskeletal: No aggressive appearing focal osseous lesions. Mild thoracolumbar spondylosis. IMPRESSION: 1. Spectrum of CT findings most compatible with a nonspecific infectious or inflammatory pancolitis, with the differential including C. diff colitis. 2. Mild sigmoid diverticulosis. The CT findings are not compatible with acute diverticulitis. 3.  Aortic Atherosclerosis (ICD10-I70.0).  Coronary atherosclerosis. Electronically Signed   By: Ilona Sorrel M.D.   On: 12/22/2016 11:24      Discharge Exam: Vitals:   12/24/16 0534 12/24/16 0630  BP: (!) 174/61 (!) 141/53  Pulse: (!) 50   Resp: 16   Temp: 97.8 F (36.6 C)   SpO2: 96%    Vitals:   12/23/16 2222 12/23/16 2310 12/24/16 0534 12/24/16 0630  BP: (!) 155/61  (!) 174/61 (!) 141/53  Pulse: 65 63 (!) 50   Resp: 16  16   Temp: 98.5 F (36.9 C)  97.8 F (36.6 C)   TempSrc: Oral  Oral   SpO2: 97%  96%   Weight:      Height:        General: Pt is alert, awake, not in acute distress Cardiovascular: RRR, S1/S2 +, no rubs, no gallops Respiratory: CTA bilaterally, no wheezing, no rhonchi Abdominal: Soft, NT, ND, bowel sounds + Extremities: no edema, no cyanosis    The results of significant diagnostics from this hospitalization (including imaging, microbiology, ancillary and laboratory) are listed below for reference.     Microbiology: No results found for this or any previous visit (from the past 240 hour(s)).   Labs: BNP (last 3 results) No results for input(s): BNP in the last 8760 hours. Basic Metabolic Panel:  Recent Labs Lab 12/22/16 0858 12/22/16 0909 12/23/16 0652 12/24/16 0749  NA 138 140 139 140  K 3.4* 3.4* 3.3* 3.4*  CL 101 103 108 108  CO2 23  --  23 24  GLUCOSE 238* 242* 123* 137*  BUN 17 21* 8 6  CREATININE  1.14* 1.00 0.70 0.65  CALCIUM 9.5  --  8.4* 8.5*  MG  --   --   --  1.6*   Liver Function Tests:  Recent Labs Lab 12/22/16 0858  AST 42*  ALT 43  ALKPHOS 85  BILITOT 1.0  PROT 7.6  ALBUMIN 4.4   No results for input(s): LIPASE, AMYLASE in the last 168 hours.  No results for input(s): AMMONIA in the last 168 hours. CBC:  Recent Labs Lab 12/22/16 0858 12/22/16 0909 12/23/16 0652 12/24/16 0749  WBC 8.4  --  6.1 6.0  NEUTROABS 5.0  --   --   --   HGB 16.2* 17.3* 12.0 12.3  HCT 48.6* 51.0* 36.8 38.2  MCV 84.2  --  85.0 85.3  PLT 251  --  164 176   Cardiac Enzymes: No results for input(s): CKTOTAL, CKMB, CKMBINDEX, TROPONINI in the last 168 hours. BNP: Invalid input(s): POCBNP CBG:  Recent Labs Lab 12/23/16 0833 12/23/16 1221 12/23/16 1747 12/23/16 2220 12/24/16 0809  GLUCAP 134* 138* 136* 138* 130*   D-Dimer No results for input(s): DDIMER in the last 72 hours. Hgb A1c No results for input(s): HGBA1C in the last 72 hours. Lipid Profile No results for input(s): CHOL, HDL, LDLCALC, TRIG, CHOLHDL, LDLDIRECT in the last 72 hours. Thyroid function studies No results for input(s): TSH, T4TOTAL, T3FREE, THYROIDAB in the last 72 hours.  Invalid input(s): FREET3 Anemia work up No results for input(s): VITAMINB12, FOLATE, FERRITIN, TIBC, IRON, RETICCTPCT in the last 72 hours. Urinalysis No results found for: COLORURINE, APPEARANCEUR, LABSPEC, [place], GLUCOSEU, HGBUR, BILIRUBINUR, KETONESUR, PROTEINUR, UROBILINOGEN, NITRITE, LEUKOCYTESUR Sepsis Labs Invalid input(s): PROCALCITONIN,  WBC,  LACTICIDVEN Microbiology No results found for this or any previous visit (from the past 240 hour(s)).   Time coordinating discharge: 40 minutes  SIGNED:  Dessa Phi, DO [facility] Pager [phone]  If 7PM-7AM, please contact night-coverage [email or web address] Password [email or web address] 12/24/2016, 10:43 AM

## 2016-12-24 NOTE — Progress Notes (Signed)
Patient was discharged home by MD order; discharged instructions  review and give to patient with care notes; IV DIC; skin intact; patient will be escorted to the car by nurse tech via wheelchair.  

## 2016-12-30 DIAGNOSIS — K529 Noninfective gastroenteritis and colitis, unspecified: Secondary | ICD-10-CM | POA: Diagnosis not present

## 2016-12-30 DIAGNOSIS — E1129 Type 2 diabetes mellitus with other diabetic kidney complication: Secondary | ICD-10-CM | POA: Diagnosis not present

## 2017-01-22 DIAGNOSIS — I480 Paroxysmal atrial fibrillation: Secondary | ICD-10-CM | POA: Diagnosis not present

## 2017-01-22 DIAGNOSIS — K529 Noninfective gastroenteritis and colitis, unspecified: Secondary | ICD-10-CM | POA: Diagnosis not present

## 2017-03-06 ENCOUNTER — Telehealth: Payer: Self-pay | Admitting: Cardiology

## 2017-03-06 NOTE — Telephone Encounter (Signed)
Pt reports low & high HRs, according to her monitor. Informed pt that if she is in AFib at the time of monitors then HRs may not be accurate through a machine. She also tells me that she "has esophagitis d/t to the Multaq", and "I have to sit up to sleep".  She also reports wheezing at night. Pt understands I will discuss a follow up OV time for Thursday afternoon, with Camnitz, and call her tomorrow. Patient verbalized understanding and agreeable to plan.

## 2017-03-06 NOTE — Telephone Encounter (Signed)
New Message  Pt c/o medication issue:  1. Name of Medication: Multaq  2. How are you currently taking this medication (dosage and times per day)? 400mg   3. Are you having a reaction (difficulty breathing--STAT)? Low t high heart rate  4. What is your medication issue? perpt states last night after taking the medication her heart rate went down to 37. She state it jumped back up to 100. Pt states it was low this morning before taking her medication. Pt states it is currently resting at 88

## 2017-03-06 NOTE — Telephone Encounter (Signed)
Follow Up Call:  Calling to speak with someone about her being AFIB . Please call

## 2017-03-08 ENCOUNTER — Encounter: Payer: Self-pay | Admitting: *Deleted

## 2017-03-08 NOTE — Telephone Encounter (Signed)
Left detailed message on cell phone informing patient that she needs to come in for OV tomorrow afternoon @ 4pm to see Dr. Curt Bears.

## 2017-03-09 ENCOUNTER — Encounter: Payer: Self-pay | Admitting: Cardiology

## 2017-03-09 ENCOUNTER — Ambulatory Visit (INDEPENDENT_AMBULATORY_CARE_PROVIDER_SITE_OTHER): Payer: Commercial Managed Care - PPO | Admitting: Cardiology

## 2017-03-09 VITALS — BP 148/80 | HR 69 | Ht 68.0 in | Wt 209.0 lb

## 2017-03-09 DIAGNOSIS — I10 Essential (primary) hypertension: Secondary | ICD-10-CM | POA: Diagnosis not present

## 2017-03-09 DIAGNOSIS — I25118 Atherosclerotic heart disease of native coronary artery with other forms of angina pectoris: Secondary | ICD-10-CM

## 2017-03-09 DIAGNOSIS — I480 Paroxysmal atrial fibrillation: Secondary | ICD-10-CM | POA: Diagnosis not present

## 2017-03-09 NOTE — Addendum Note (Signed)
Addended by: Stanton Kidney on: 03/09/2017 05:13 PM   Modules accepted: Orders

## 2017-03-09 NOTE — Patient Instructions (Addendum)
Medication Instructions:  Your physician recommends that you continue on your current medications as directed. Please refer to the Current Medication list given to you today.  * If you need a refill on your cardiac medications before your next appointment, please call your pharmacy. *  Labwork: None ordered  Testing/Procedures: Your physician has recommended that you wear an event monitor. Event monitors are medical devices that record the heart's electrical activity. Doctors most often [place] these monitors to diagnose arrhythmias. Arrhythmias are problems with the speed or rhythm of the heartbeat. The monitor is a small, portable device. You can wear one while you do your normal daily activities. This is usually used to diagnose what is causing palpitations/syncope (passing out).    Follow-Up: Your physician wants you to follow-up in: 6 months with Dr. Curt Bears.  You will receive a reminder letter in the mail two months in advance. If you don't receive a letter, please call our office to schedule the follow-up appointment.  Thank you for choosing [facility]!!   Trinidad Curet, RN [phone]  Any Other Special Instructions Will Be Listed Below (If Applicable).   Cardiac Event Monitoring A cardiac event monitor is a small recording device that is used to detect abnormal heart rhythms (arrhythmias). The monitor is used to record your heart rhythm when you have symptoms, such as:  Fast heartbeats (palpitations), such as heart racing or fluttering.  Dizziness.  Fainting or light-headedness.  Unexplained weakness.  Some monitors are wired to electrodes placed on your chest. Electrodes are flat, sticky disks that attach to your skin. Other monitors may be hand-held or worn on the wrist. The monitor can be worn for up to 30 days. If the monitor is attached to your chest, a technician will prepare your chest for the electrode placement and show you how to work the monitor. Take time to  practice using the monitor before you leave the office. Make sure you understand how to send the information from the monitor to your health care provider. In some cases, you may need to use a landline telephone instead of a cell phone. What are the risks? Generally, this device is safe to use, but it possible that the skin under the electrodes will become irritated. How to use your cardiac event monitor  Wear your monitor at all times, except when you are in water: ? Do not let the monitor get wet. ? Take the monitor off when you bathe. Do not swim or use a hot tub with it on.  Keep your skin clean. Do not put body lotion or moisturizer on your chest.  Change the electrodes as told by your health care provider or any time they stop sticking to your skin. You may need to use medical tape to keep them on.  Try to put the electrodes in slightly different places on your chest to help prevent skin irritation. They must remain in the area under your left breast and in the upper right section of your chest.  Make sure the monitor is safely clipped to your clothing or in a location close to your body that your health care provider recommends.  Press the button to record as soon as you feel heart-related symptoms, such as: ? Dizziness. ? Weakness. ? Light-headedness. ? Palpitations. ? Thumping or pounding in your chest. ? Shortness of breath. ? Unexplained weakness.  Keep a diary of your activities, such as walking, doing chores, and taking medicine. It is very important to note what  you were doing when you pushed the button to record your symptoms. This will help your health care provider determine what might be contributing to your symptoms.  Send the recorded information as recommended by your health care provider. It may take some time for your health care provider to process the results.  Change the batteries as told by your health care provider.  Keep electronic devices away from your  monitor. This includes: ? Tablets. ? MP3 players. ? Cell phones.  While wearing your monitor you should avoid: ? Electric blankets. ? Armed forces operational officer. ? Electric toothbrushes. ? Microwave ovens. ? Magnets. ? Metal detectors. Get help right away if:  You have chest pain.  You have extreme difficulty breathing or shortness of breath.  You develop a very fast heartbeat that persists.  You develop dizziness that does not go away.  You faint or constantly feel like you are about to faint. Summary  A cardiac event monitor is a small recording device that is used to help detect abnormal heart rhythms (arrhythmias).  The monitor is used to record your heart rhythm when you have heart-related symptoms.  Make sure you understand how to send the information from the monitor to your health care provider.  It is important to press the button on the monitor when you have any heart-related symptoms.  Keep a diary of your activities, such as walking, doing chores, and taking medicine. It is very important to note what you were doing when you pushed the button to record your symptoms. This will help your health care provider learn what might be causing your symptoms. This information is not intended to replace advice given to you by your health care provider. Make sure you discuss any questions you have with your health care provider. Document Released: 11/24/2007 Document Revised: 01/30/2016 Document Reviewed: 01/30/2016 Elsevier Interactive Patient Education  2017 [employer].

## 2017-03-09 NOTE — Progress Notes (Signed)
Electrophysiology Office Note   Date:  03/09/2017   ID:  Brittany Buckley, DOB December 05, 1955, MRN [id number]  PCP:  Marton Redwood, MD  Cardiologist:  [place] Primary Electrophysiologist:  Samariyah Cowles Meredith Leeds, MD    Chief Complaint  Patient presents with  . Follow-up    PAF     History of Present Illness: Brittany Buckley is a 62 y.o. female who is being seen today for the evaluation of atrial fibrillaiton at the request of Marton Redwood, MD. Presenting today for electrophysiology evaluation. She has a history of atrial fibrillation with attempted Tikosyn load, but aborted secondary to QT prolongation. She also has a history of hypertension, hyperlipidemia, coronary disease with PCI, diabetes. Due to QT prolongation, she was started on Multaq.  Today, denies symptoms of palpitations, chest pain, shortness of breath, orthopnea, PND, lower extremity edema, claudication, dizziness, presyncope, syncope, bleeding, or neurologic sequela. The patient is tolerating medications without difficulties.  She recently had an episode, before bed, where she had heart rates in the 30s.  She had long pauses.  She did not feel like she was in atrial fibrillation.  She had significant fatigue and weakness during that time.  It happened again later that evening.  She started drinking coffee which improved her symptoms she feels it due to the caffeine.   Past Medical History:  Diagnosis Date  . A-fib ([place])   . ADD (attention deficit disorder)   . Anginal pain ([place])   . Anxiety   . Arthritis    "hands, feet" (06/14/2016)  . Asthma   . Chronic lower back pain   . Chronic neck pain   . Coronary artery disease   . GERD (gastroesophageal reflux disease)   . Headache    "monthly" (06/14/2016)  . Heart murmur   . Hyperlipidemia   . Hypertension   . MI (myocardial infarction) ([place]) 2014; 2015  . Migraine    "probably twice/year" (06/14/2016)  . Numbness of toes   . Obesity   . On anticoagulant therapy    . Peripheral neuropathy   . Pneumonia 1990s  . Thyroid nodule   . Type II diabetes mellitus ([place])   . Urticaria    Past Surgical History:  Procedure Laterality Date  . ABDOMINAL HYSTERECTOMY    . BIOPSY THYROID    . CESAREAN SECTION    . CORONARY ANGIOPLASTY WITH STENT PLACEMENT  2014 & 2015  . TONSILLECTOMY AND ADENOIDECTOMY    . TRIGGER FINGER RELEASE Right    "pointer"     Current Outpatient Medications  Medication Sig Dispense Refill  . albuterol (PROVENTIL HFA;VENTOLIN HFA) 108 (90 Base) MCG/ACT inhaler Inhale 1-2 puffs into the lungs every 6 (six) hours as needed for wheezing or shortness of breath.    Marland Kitchen atorvastatin (LIPITOR) 80 MG tablet Take 80 mg by mouth daily.    . Cyanocobalamin (VITAMIN B-12 PO) Take 1 tablet by mouth daily.    . dabigatran (PRADAXA) 150 MG CAPS capsule Take 150 mg by mouth 2 (two) times daily.    Marland Kitchen dronedarone (MULTAQ) 400 MG tablet Take 1 tablet (400 mg total) by mouth 2 (two) times daily with a meal. 60 tablet 6  . furosemide (LASIX) 40 MG tablet Take 40 mg by mouth daily.    Marland Kitchen glipiZIDE (GLUCOTROL) 5 MG tablet Take 5 mg by mouth 2 (two) times daily before a meal.     . hydrALAZINE (APRESOLINE) 50 MG tablet Take 50 mg by mouth 2 (two) times daily.     Marland Kitchen  hydrocortisone cream 1 % Apply topically as needed for itching. 30 g 0  . lisinopril (PRINIVIL,ZESTRIL) 40 MG tablet Take 40 mg by mouth daily.    . metFORMIN (GLUCOPHAGE) 1000 MG tablet Take 1,000 mg by mouth 2 (two) times daily with a meal.    . metoprolol (LOPRESSOR) 50 MG tablet Take 50 mg by mouth 2 (two) times daily.    . Multiple Vitamins-Minerals (MULTIVITAMIN GUMMIES ADULTS) CHEW Chew 1 tablet by mouth daily.    . nitroGLYCERIN (NITROSTAT) 0.4 MG SL tablet Place 0.4 mg under the tongue every 5 (five) minutes as needed for chest pain.     No current facility-administered medications for this visit.     Allergies:   Codeine; Onion; Tomato; Chocolate; Demerol [meperidine]; and Morphine  and related   Social History:  The patient  reports that  has never smoked. she has never used smokeless tobacco. She reports that she drinks about 0.6 oz of alcohol per week. She reports that she does not use drugs.   Family History:  The patient's family history includes CAD in her father; Cancer - Ovarian in her sister; Diabetes in her maternal grandmother and paternal grandfather; Healthy in her son; Heart attack in her father; Heart disease in her paternal grandmother; Hyperlipidemia in her mother; Obesity in her daughter.   ROS:  Please see the history of present illness.   Otherwise, review of systems is positive for sweats, fatigue, chest pressure, shortness of breath, palpitations, hearing loss, snoring, wheezing, abdominal pain, diarrhea, muscle pain, headaches.   All other systems are reviewed and negative.   PHYSICAL EXAM: VS:  BP (!) 148/80   Pulse 69   Ht 5\' 8"  (1.727 m)   Wt 209 lb (94.8 kg)   BMI 31.78 kg/m  , BMI Body mass index is 31.78 kg/m. GEN: Well nourished, well developed, in no acute distress  HEENT: normal  Neck: no JVD, carotid bruits, or masses Cardiac: RRR; no murmurs, rubs, or gallops,no edema  Respiratory:  clear to auscultation bilaterally, normal work of breathing GI: soft, nontender, nondistended, + BS MS: no deformity or atrophy  Skin: warm and dry Neuro:  Strength and sensation are intact Psych: euthymic mood, full affect  EKG:  EKG is ordered today. Personal review of the ekg ordered shows sinus rhythm, QTC 484, rate 69  ROS:  Please see the history of present illness.   Otherwise, review of systems is positive for sweats, fatigue, shortness of breath, palpitations, hearing loss, snoring, wheezing, abdominal pain, diarrhea, muscle pain, headaches.   All other systems are reviewed and negative.   PHYSICAL EXAM: VS:  BP (!) 148/80   Pulse 69   Ht 5\' 8"  (1.727 m)   Wt 209 lb (94.8 kg)   BMI 31.78 kg/m  , BMI Body mass index is 31.78 kg/m. GEN:  Well nourished, well developed, in no acute distress  HEENT: normal  Neck: no JVD, carotid bruits, or masses Cardiac: RRR; no murmurs, rubs, or gallops,no edema  Respiratory:  clear to auscultation bilaterally, normal work of breathing GI: soft, nontender, nondistended, + BS MS: no deformity or atrophy  Skin: warm and dry Neuro:  Strength and sensation are intact Psych: euthymic mood, full affect  EKG:  EKG is ordered today. Personal review of the ekg ordered shows SR, QTc 484   Recent Labs: 06/14/2016: TSH 0.923 12/22/2016: ALT 43 12/24/2016: BUN 6; Creatinine, Ser 0.65; Hemoglobin 12.3; Magnesium 1.6; Platelets 176; Potassium 3.4; Sodium 140  Lipid Panel     Component Value Date/Time   CHOL 179 04/04/2016 1540   TRIG 180 (H) 04/04/2016 1540   HDL 47 04/04/2016 1540   CHOLHDL 3.8 04/04/2016 1540   LDLCALC 96 04/04/2016 1540     Wt Readings from Last 3 Encounters:  03/09/17 209 lb (94.8 kg)  12/22/16 213 lb (96.6 kg)  07/18/16 207 lb 9.6 oz (94.2 kg)      Other studies Reviewed: Additional studies/ records that were reviewed today include: TTE 04/04/16  Review of the above records today demonstrates:  - Left ventricle: The cavity size was normal. There was mild focal   basal hypertrophy of the septum. Systolic function was normal.   The estimated ejection fraction was in the range of 60% to 65%.   Wall motion was normal; there were no regional wall motion   abnormalities. Doppler parameters are consistent with abnormal   left ventricular relaxation (grade 1 diastolic dysfunction). - Aortic valve: Trileaflet; mildly thickened, mildly calcified   leaflets. - Left atrium: The atrium was mildly dilated. Volume/bsa, ES,   (1-plane Simpson&'s, A2C): 38.1 ml/m^2.   ASSESSMENT AND PLAN:  1.  Paroxysmal atrial fibrillation: Currently on Pradaxa for anticoagulation.  She has failed dofetilide due to QT prolongation and has since been put on Multitak.  She did have an  episode where her heart rate got due to that, we Brittany Buckley place a 30-day monitor for further evaluation.  This patients CHA2DS2-VASc Score and unadjusted Ischemic Stroke Rate (% per year) is equal to 3.2 % stroke rate/year from a score of 3  Above score calculated as 1 point each if present [CHF, HTN, DM, Vascular=MI/PAD/Aortic Plaque, Age if 65-74, or Female] Above score calculated as 2 points each if present [Age > 75, or Stroke/TIA/TE]  2. Coronary artery disease with stable angina: Mild symptoms of angina but she has not had to take nitroglycerin.  I have told her that is okay based on her current medication to take nitroglycerin.  No other changes.  3.  Hypertension: Mildly elevated but has been normal in the past.  No changes.   Current medicines are reviewed at length with the patient today.   The patient does not have concerns regarding her medicines.  The following changes were made today: None  Labs/ tests ordered today include: 30 day monitor Orders Placed This Encounter  Procedures  . EKG 12-Lead  . EKG 12-Lead     Disposition:   FU with Brittany Buckley 6 months  Signed, Brittany Buckley Meredith Leeds, MD  03/09/2017 4:54 PM     [address] [place] [address] [phone] (office) [phone] (fax)

## 2017-03-17 ENCOUNTER — Ambulatory Visit (INDEPENDENT_AMBULATORY_CARE_PROVIDER_SITE_OTHER): Payer: Commercial Managed Care - PPO

## 2017-03-17 DIAGNOSIS — I480 Paroxysmal atrial fibrillation: Secondary | ICD-10-CM

## 2017-03-21 DIAGNOSIS — I480 Paroxysmal atrial fibrillation: Secondary | ICD-10-CM | POA: Diagnosis not present

## 2017-04-04 ENCOUNTER — Telehealth: Payer: Self-pay | Admitting: *Deleted

## 2017-04-04 NOTE — Telephone Encounter (Signed)
[employer] faxed event monitor recording on this pt for 04/04/17 at 1232 am EST.  Event showed pt to be in afib with a rate of 90 bpm.  Pt is currently on Multaq and Pradaxa (for anticoagulation).  Showed DOD Dr. Acie Fredrickson, in absence of Dr. Curt Bears.  Per DOD Dr. Acie Fredrickson, no change in med regimen needed, and continue to monitor.  Pt was resting at the time of event, with no symptoms called into the company.  Event was auto-triggered.  Will route this message to both Dr. Curt Bears and covering RN, as an FYI.  Report will be in Dr. Curt Bears mailbox for further review, upon return to the office.

## 2017-04-05 NOTE — Telephone Encounter (Signed)
lmtcb

## 2017-04-27 ENCOUNTER — Telehealth: Payer: Self-pay | Admitting: Cardiology

## 2017-04-27 NOTE — Telephone Encounter (Signed)
Returned call to patient. Patient made aware of cardiac monitor results. She verbalized understanding and thankful for the call. Informed patient to call back with any questions or concerns.

## 2017-04-27 NOTE — Telephone Encounter (Signed)
New message    Patient returning call to nurse to discuss monitor. Please call

## 2017-05-11 DIAGNOSIS — E7849 Other hyperlipidemia: Secondary | ICD-10-CM | POA: Diagnosis not present

## 2017-05-11 DIAGNOSIS — I10 Essential (primary) hypertension: Secondary | ICD-10-CM | POA: Diagnosis not present

## 2017-05-11 DIAGNOSIS — E1149 Type 2 diabetes mellitus with other diabetic neurological complication: Secondary | ICD-10-CM | POA: Diagnosis not present

## 2017-05-11 DIAGNOSIS — E1129 Type 2 diabetes mellitus with other diabetic kidney complication: Secondary | ICD-10-CM | POA: Diagnosis not present

## 2017-08-24 DIAGNOSIS — E1129 Type 2 diabetes mellitus with other diabetic kidney complication: Secondary | ICD-10-CM | POA: Diagnosis not present

## 2017-08-24 DIAGNOSIS — N183 Chronic kidney disease, stage 3 (moderate): Secondary | ICD-10-CM | POA: Diagnosis not present

## 2017-08-24 DIAGNOSIS — E1149 Type 2 diabetes mellitus with other diabetic neurological complication: Secondary | ICD-10-CM | POA: Diagnosis not present

## 2017-10-10 DIAGNOSIS — I10 Essential (primary) hypertension: Secondary | ICD-10-CM | POA: Diagnosis not present

## 2017-10-10 DIAGNOSIS — E1129 Type 2 diabetes mellitus with other diabetic kidney complication: Secondary | ICD-10-CM | POA: Diagnosis not present

## 2017-10-10 DIAGNOSIS — E7849 Other hyperlipidemia: Secondary | ICD-10-CM | POA: Diagnosis not present

## 2017-10-12 DIAGNOSIS — R82998 Other abnormal findings in urine: Secondary | ICD-10-CM | POA: Diagnosis not present

## 2017-10-12 DIAGNOSIS — E1129 Type 2 diabetes mellitus with other diabetic kidney complication: Secondary | ICD-10-CM | POA: Diagnosis not present

## 2017-10-12 DIAGNOSIS — E1149 Type 2 diabetes mellitus with other diabetic neurological complication: Secondary | ICD-10-CM | POA: Diagnosis not present

## 2017-10-20 DIAGNOSIS — Z1212 Encounter for screening for malignant neoplasm of rectum: Secondary | ICD-10-CM | POA: Diagnosis not present

## 2017-11-14 ENCOUNTER — Encounter (INDEPENDENT_AMBULATORY_CARE_PROVIDER_SITE_OTHER): Payer: Commercial Managed Care - PPO | Admitting: Ophthalmology

## 2018-02-24 DIAGNOSIS — H16142 Punctate keratitis, left eye: Secondary | ICD-10-CM | POA: Diagnosis not present

## 2018-02-24 DIAGNOSIS — T1512XA Foreign body in conjunctival sac, left eye, initial encounter: Secondary | ICD-10-CM | POA: Diagnosis not present

## 2018-03-05 DIAGNOSIS — I10 Essential (primary) hypertension: Secondary | ICD-10-CM | POA: Diagnosis not present

## 2018-03-05 DIAGNOSIS — E7849 Other hyperlipidemia: Secondary | ICD-10-CM | POA: Diagnosis not present

## 2018-03-05 DIAGNOSIS — E1149 Type 2 diabetes mellitus with other diabetic neurological complication: Secondary | ICD-10-CM | POA: Diagnosis not present

## 2018-03-05 DIAGNOSIS — E1129 Type 2 diabetes mellitus with other diabetic kidney complication: Secondary | ICD-10-CM | POA: Diagnosis not present

## 2018-03-05 DIAGNOSIS — R1084 Generalized abdominal pain: Secondary | ICD-10-CM | POA: Diagnosis not present

## 2018-03-06 DIAGNOSIS — R1084 Generalized abdominal pain: Secondary | ICD-10-CM | POA: Diagnosis not present

## 2018-03-06 DIAGNOSIS — E7849 Other hyperlipidemia: Secondary | ICD-10-CM | POA: Diagnosis not present

## 2018-03-22 ENCOUNTER — Other Ambulatory Visit: Payer: Self-pay | Admitting: Internal Medicine

## 2018-03-22 ENCOUNTER — Ambulatory Visit
Admission: RE | Admit: 2018-03-22 | Discharge: 2018-03-22 | Disposition: A | Discharge status: Home / Self Care | Payer: Commercial Managed Care - PPO | Source: Ambulatory Visit | Attending: Internal Medicine | Admitting: Internal Medicine

## 2018-03-22 DIAGNOSIS — R110 Nausea: Secondary | ICD-10-CM | POA: Diagnosis not present

## 2018-03-22 DIAGNOSIS — R1084 Generalized abdominal pain: Secondary | ICD-10-CM | POA: Diagnosis not present

## 2018-03-22 DIAGNOSIS — Z6831 Body mass index (BMI) 31.0-31.9, adult: Secondary | ICD-10-CM | POA: Diagnosis not present

## 2018-03-22 DIAGNOSIS — K559 Vascular disorder of intestine, unspecified: Secondary | ICD-10-CM

## 2018-03-22 DIAGNOSIS — R109 Unspecified abdominal pain: Secondary | ICD-10-CM | POA: Diagnosis not present

## 2018-03-22 MED ORDER — IOPAMIDOL (ISOVUE-370) INJECTION 76%
75.0000 mL | Freq: Once | INTRAVENOUS | Status: AC | PRN
  Administered 2018-03-22: 75 mL via INTRAVENOUS

## 2018-03-23 DIAGNOSIS — R1011 Right upper quadrant pain: Secondary | ICD-10-CM | POA: Diagnosis not present

## 2018-03-23 DIAGNOSIS — R1084 Generalized abdominal pain: Secondary | ICD-10-CM | POA: Diagnosis not present

## 2018-06-29 ENCOUNTER — Telehealth: Payer: Self-pay | Admitting: Cardiology

## 2018-06-29 NOTE — Telephone Encounter (Signed)
Spoke with office and they are requesting sooner app for pt then 10/05/18 w Dr Curt Bears. Pt gave verbal consent for phone visit, pt uses zoom for virtual visits but was accepting of a phone visit. Either is acceptable for pt.  Will have call for 11:30 07/02/18 w [facility] APP.

## 2018-06-29 NOTE — Telephone Encounter (Signed)
Pt was seen via telemedicine for elevated bp, angina and afib by an NP at Dr. Raul Del office today. NP recommended that she get in with her Cardiologist or APP first thing Monday morning. The first available appt was not until Thursday 05/07 at 11:30 am, and the NP feels that this may be too long for the pt to wait. The NP was hoping that the pt could be seen sooner if another pt were to cancel

## 2018-07-02 ENCOUNTER — Encounter: Payer: Self-pay | Admitting: Cardiology

## 2018-07-02 ENCOUNTER — Telehealth: Payer: Commercial Managed Care - PPO | Admitting: Cardiology

## 2018-07-02 ENCOUNTER — Telehealth (INDEPENDENT_AMBULATORY_CARE_PROVIDER_SITE_OTHER): Payer: Commercial Managed Care - PPO | Admitting: Cardiology

## 2018-07-02 ENCOUNTER — Other Ambulatory Visit: Payer: Self-pay

## 2018-07-02 ENCOUNTER — Telehealth: Payer: Self-pay

## 2018-07-02 VITALS — BP 152/118 | HR 79 | Temp 98.6°F | Ht 68.0 in | Wt 199.0 lb

## 2018-07-02 DIAGNOSIS — I10 Essential (primary) hypertension: Secondary | ICD-10-CM

## 2018-07-02 DIAGNOSIS — I200 Unstable angina: Secondary | ICD-10-CM

## 2018-07-02 MED ORDER — CARVEDILOL 6.25 MG PO TABS: 6.2500 mg | ORAL_TABLET | Freq: Two times a day (BID) | ORAL | 3 refills | Status: DC

## 2018-07-02 NOTE — Progress Notes (Addendum)
Virtual Visit via Video Note   This visit type was conducted due to national recommendations for restrictions regarding the COVID-19 Pandemic (e.g. social distancing) in an effort to limit this patient's exposure and mitigate transmission in our community.  Due to her co-morbid illnesses, this patient is at least at moderate risk for complications without adequate follow up.  This format is felt to be most appropriate for this patient at this time.  All issues noted in this document were discussed and addressed.  A limited physical exam was performed with this format.  Please refer to the patient's chart for her consent to telehealth for [facility].   Date:  07/02/2018   ID:  Brittany Buckley, DOB [birth year], MRN [id number]  Patient Location: Home Provider Location: Home  PCP:  Marton Redwood, MD  Cardiologist:  Mertie Moores, MD  Electrophysiologist:  Constance Haw, MD   Evaluation Performed:  Follow-Up Visit  Chief Complaint:  CP and Elevated BP  History of Present Illness:    Brittany Buckley is a 63 y.o. female, referred by PCP for telehealth visit for unstable angina and hypertension.  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  She has been under a great deal of stress here recently.  She has a history of atrial fibrillation with attempted Tikosyn load, but aborted secondary to QT prolongation. Due to QT prolongation, she was started on Multaq. On Pradaxa for anticoagulation. Followed in EP clinic by Dr. Curt Bears. Dr.  Cathie Olden is primary cardiologist. She also has a history of hypertension, hyperlipidemia, coronary disease with PCI and diabetes. We have limited details available in chart regarding CAD. She used to reside in [place] but moved to [facility].After move to [place], she established care w/ Dr. Acie Fredrickson. Was initially seen in 2018, no complaints of CP at that  time. Primary issue since she has been followed by our clinic has been atrial fibrillation.  Patient reports that she had her first heart attack about 7 years ago while living in [place].  This was treated medically.  She then moved to [place] and had another heart attack and underwent coronary stent placement x2.  She has not had repeat cardiac catheterization in over 6 years.  Pt has been added on to my schedule for virtual video visit as advised by Dr. Raul Del office after a telehealt visit on 5/1 when she complained of elevated bp, angina and symptoms w/ afib.  Pt reports several episodes of chest discomfort which she describes as anginal attacks over the last week.  One episode was very severe however it resolved with sublingual nitroglycerin.  Occurred at rest.  She denies exertional chest discomfort but does note exertional dyspnea however she states that this is her baseline.  She gets short of breath walking her dog.  She has also had fluctuations in her heart rate as well as blood pressure. HR was elevated the other day in the 130s.  Blood pressure is markedly elevated today at 152/118.  Pulse rate 79 bpm currently.  Her only antihypertensive currently is lisinopril and she is maxed out on the dose at 40 mg daily.  She reports full compliance.  She believes that she was at one point on metoprolol but reports that it was discontinued in the past due to bradycardia. She reports full compliance with her statin, she is on Lipitor 80.  She is  not on aspirin due to Pradaxa.  The patient does not have symptoms concerning for COVID-19 infection (fever, chills, cough, or new shortness of breath).    Past Medical History:  Diagnosis Date   A-fib [facility])    ADD (attention deficit disorder)    Anginal pain ([place])    Anxiety    Arthritis    "hands, feet" (06/14/2016)   Asthma    Chronic lower back pain    Chronic neck pain    Coronary artery disease    GERD (gastroesophageal reflux  disease)    Headache    "monthly" (06/14/2016)   Heart murmur    Hyperlipidemia    Hypertension    MI (myocardial infarction) ([place]) 2014; 2015   Migraine    "probably twice/year" (06/14/2016)   Numbness of toes    Obesity    On anticoagulant therapy    Peripheral neuropathy    Pneumonia 1990s   Thyroid nodule    Type II diabetes mellitus ([facility])    Urticaria    Past Surgical History:  Procedure Laterality Date   ABDOMINAL HYSTERECTOMY     BIOPSY THYROID     CESAREAN SECTION     CORONARY ANGIOPLASTY WITH STENT PLACEMENT  2014 & 2015   TONSILLECTOMY AND ADENOIDECTOMY     TRIGGER FINGER RELEASE Right    "pointer"     Current Meds  Medication Sig   albuterol (PROVENTIL HFA;VENTOLIN HFA) 108 (90 Base) MCG/ACT inhaler Inhale 1-2 puffs into the lungs every 6 (six) hours as needed for wheezing or shortness of breath.   atorvastatin (LIPITOR) 80 MG tablet Take 80 mg by mouth daily.   Cyanocobalamin (VITAMIN B-12 PO) Take 1 tablet by mouth daily.   dabigatran (PRADAXA) 150 MG CAPS capsule Take 150 mg by mouth 2 (two) times daily.   diphenhydramine-acetaminophen (TYLENOL PM) 25-500 MG TABS tablet Take 1 tablet by mouth at bedtime as needed.   dronedarone (MULTAQ) 400 MG tablet Take 1 tablet (400 mg total) by mouth 2 (two) times daily with a meal.   DULoxetine (CYMBALTA) 60 MG capsule Take 60 mg by mouth at bedtime.   ezetimibe (ZETIA) 10 MG tablet Take 10 mg by mouth daily.   hydrocortisone cream 1 % Apply topically as needed for itching.   JARDIANCE 25 MG TABS tablet Take 25 mg by mouth daily.   lisinopril (PRINIVIL,ZESTRIL) 40 MG tablet Take 40 mg by mouth daily.   metFORMIN (GLUCOPHAGE) 1000 MG tablet Take 1,000 mg by mouth 2 (two) times daily with a meal.   Multiple Vitamins-Minerals (MULTIVITAMIN GUMMIES ADULTS) CHEW Chew 1 tablet by mouth daily.   nitroGLYCERIN (NITROSTAT) 0.4 MG SL tablet Place 0.4 mg under the tongue every 5 (five) minutes as  needed for chest pain.     Allergies:   Codeine; Onion; Tomato; Chocolate; Demerol [meperidine]; and Morphine and related   Social History   Tobacco Use   Smoking status: Never Smoker   Smokeless tobacco: Never Used  Substance Use Topics   Alcohol use: Yes    Alcohol/week: 1.0 standard drinks    Types: 1 Glasses of wine per week   Drug use: No     Family Hx: The patient's family history includes CAD in her father; Cancer - Ovarian in her sister; Diabetes in her maternal grandmother and paternal grandfather; Healthy in her son; Heart attack in her father; Heart disease in her paternal grandmother; Hyperlipidemia in her mother; Obesity in her daughter.  ROS:   Please see the  history of present illness.     All other systems reviewed and are negative.   Prior CV studies:   The following studies were reviewed today:  2D Echo 03/2016 Study Conclusions  - Left ventricle: The cavity size was normal. There was mild focal   basal hypertrophy of the septum. Systolic function was normal.   The estimated ejection fraction was in the range of 60% to 65%.   Wall motion was normal; there were no regional wall motion   abnormalities. Doppler parameters are consistent with abnormal   left ventricular relaxation (grade 1 diastolic dysfunction). - Aortic valve: Trileaflet; mildly thickened, mildly calcified   leaflets. - Left atrium: The atrium was mildly dilated. Volume/bsa, ES,   (1-plane Simpson&'s, A2C): 38.1 ml/m^2.  Labs/Other Tests and Data Reviewed:    EKG:  An ECG dated 03/09/2017 was personally reviewed today and demonstrated:  NSR 69 bpm   Recent Labs: No results found for requested labs within last 8760 hours.   Recent Lipid Panel Lab Results  Component Value Date/Time   CHOL 179 04/04/2016 03:40 PM   TRIG 180 (H) 04/04/2016 03:40 PM   HDL 47 04/04/2016 03:40 PM   CHOLHDL 3.8 04/04/2016 03:40 PM   LDLCALC 96 04/04/2016 03:40 PM    Wt Readings from Last 3  Encounters:  07/02/18 199 lb (90.3 kg)  03/09/17 209 lb (94.8 kg)  12/22/16 213 lb (96.6 kg)     Objective:    Vital Signs:  BP (!) 152/118    Pulse 79    Temp 98.6 F (37 C)    Ht 5\' 8"  (1.727 m)    Wt 199 lb (90.3 kg)    SpO2 96%    BMI 30.26 kg/m    VITAL SIGNS:  reviewed GEN:  no acute distress EYES:  sclerae anicteric, EOMI - Extraocular Movements Intact RESPIRATORY:  normal respiratory effort, symmetric expansion CARDIOVASCULAR:  no peripheral edema SKIN:  no rash, lesions or ulcers. MUSCULOSKELETAL:  no obvious deformities. NEURO:  alert and oriented x 3, no obvious focal deficit PSYCH:  normal affect  ASSESSMENT & PLAN:    1.  CAD with unstable angina: known CAD and h/o MI w/ subsequent coronary stenting x 2 in [place] 6 years ago. No repeat ischemic evaluations since then. Now w/ unstable angina, relieved w/ SL NTG. Currently asymptomatic during video visit. Appears well. Will add  blocker to regimen. Continue on statin and ACEi. No ASA due to Pradaxa. Will arrange office visit w/ DOD this week to discuss cardiac catheterization. Pt has SL NTG at home. Pt advised to go to ED if severe recurrent symptoms, not relieved w/ SL NTG. She verbalized understanding and is agreeable to plan/ recommendations.   2.  Hypertension: Elevated. Lots of work/school related stress. Maxed out on Lisinopril. Will add Coreg 6.25 mg BID. HR 79. Will arrange office visit w/ DOD this week for possible cath and f/u on BP.  3.  Atrial fibrillation: Followed by Dr. Curt Bears.  On antiarrhythmic drug therapy with Multaq. We will add low dose beta-blocker, Coreg, due to CAD with symptoms of angina and hypertension. She had some bradycardia in the past w/ metoprolol. I've contacted DOD, Dr. Johnsie Cancel. Ok to use  blocker w/ Multaq but pt advised to monitor pulse rate upon initiation. Opting on Coreg given greater effects on BP than HR.  On Pradaxa for anticoagulation.  4.  Hyperlipidemia: On atorvastatin 80  mg nightly.  Reports full compliance.  5.  Diabetes: Management  per PCP.    COVID-19 Education: The signs and symptoms of COVID-19 were discussed with the patient and how to seek care for testing (follow up with PCP or arrange E-visit).  The importance of social distancing was discussed today.  Time:   Today, I have spent 25 minutes with the patient with telehealth technology discussing the above problems.     Medication Adjustments/Labs and Tests Ordered: Current medicines are reviewed at length with the patient today.  Concerns regarding medicines are outlined above.   Tests Ordered: No orders of the defined types were placed in this encounter.   Medication Changes: No orders of the defined types were placed in this encounter.   Disposition:  Follow up w/ DOD this week in clinic for possible cath.  Signed, Lyda Jester, PA-C  07/02/2018 11:58 AM    [facility]

## 2018-07-02 NOTE — Telephone Encounter (Signed)
   TELEPHONE CALL NOTE  This patient has been deemed a candidate for follow-up tele-health visit to limit community exposure during the Covid-19 pandemic. I spoke with the patient via phone to discuss instructions.  A Virtual Office Visit appointment type has been scheduled for 5/4 with Lyda Jester, PA, with "VIDEO" or "TELEPHONE" in the appointment notes - patient prefers Video type.    Frederik Schmidt, RN 07/02/2018 8:13 AM   Patient consented to video virtual visit with Lyda Jester, PA 5/4 @ 11:30 am.

## 2018-07-02 NOTE — Telephone Encounter (Signed)
Left patient message to confirm appointment with Brittainy @ 11:30 am.

## 2018-07-02 NOTE — Patient Instructions (Signed)
Medication Instructions:  START Carvedilol (Coreg) 6.25 mg twice per day  Monitor for slow heart rate when you start. If you need a refill on your cardiac medications before your next appointment, please call your pharmacy.   Lab work: none If you have labs (blood work) drawn today and your tests are completely normal, you will receive your results only by: Marland Kitchen MyChart Message (if you have MyChart) OR . A paper copy in the mail If you have any lab test that is abnormal or we need to change your treatment, we will call you to review the results.  Testing/Procedures: none  Follow-Up: Thursday 5/7 @ 8 am with Dr Tamala Julian at the office At [facility], you and your health needs are our priority.  As part of our continuing mission to provide you with exceptional heart care, we have created designated Provider Care Teams.  These Care Teams include your primary Cardiologist (physician) and Advanced Practice Providers (APPs -  Physician Assistants and Nurse Practitioners) who all work together to provide you with the care you need, when you need it.  Any Other Special Instructions Will Be Listed Below (If Applicable).  Please go to the ED if you experience severe recurrent Chest Pain, unrelieved with Nitroglycerin Sublingual.

## 2018-07-02 NOTE — Telephone Encounter (Signed)
I spoke to the patient after she visited with Brittainy this morning.  I instructed her of Brittainy's recommendations and arranged an OV with Dr Tamala Julian on Thursday 5/7 @ 8 am.  I forwarded in My Chart.

## 2018-07-04 NOTE — H&P (View-Only) (Signed)
Cardiology Office Note:    Date:  07/05/2018   ID:  Brittany Buckley, DOB 03/19/1955, MRN [id number]  PCP:  Marton Redwood, MD  Cardiologist:  Mertie Moores, MD   Referring MD: Marton Redwood, MD   Chief Complaint  Patient presents with  . Coronary Artery Disease    Angina pectoris    History of Present Illness:    Brittany Buckley is a 63 y.o. female with a hx of PAF on Multaq, chronic anticoagulation Pradaxa, CAD with MI x 2 (2013 and 2014), prior DES x 2014, hypertension, hyperlipidemia, and DM II.   The patient is a high Microbiologist and independent school systems.  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  She is under a lot of stress both with her job, at the [employer], and her PhD work.  She has a prior history of coronary disease as noted above.  Over the past 10 to 14 days she has had intermittent episodes of chest tightness.  10 days ago she had a particularly severe episode and required 2 nitroglycerin tablets.  The discomfort started at rest.  Eventually, the nitroglycerin relieved the discomfort after 15 to 20 minutes.  Since her most recent stent in 2016 she has had intermittent episodes of chest tightness but usually only with physical activity.  Because of this change she is concerned and was seen via virtual telehealth visit by Ellen Henri within the past 7 days.  She was noted to have elevated blood pressure and adjustments were made.  Low-dose beta-blocker therapy was started.  No side effects have been noted.  Past Medical History:  Diagnosis Date  . A-fib ([place])   . ADD (attention deficit disorder)   . Anginal pain ([place])   . Anxiety   . Arthritis    "hands, feet" (06/14/2016)  . Asthma   . Chronic lower back pain   . Chronic neck pain   . Coronary artery disease   . GERD (gastroesophageal reflux disease)   . Headache    "monthly" (06/14/2016)  . Heart murmur   . Hyperlipidemia   . Hypertension   . MI  (myocardial infarction) ([place]) 2014; 2015  . Migraine    "probably twice/year" (06/14/2016)  . Numbness of toes   . Obesity   . On anticoagulant therapy   . Peripheral neuropathy   . Pneumonia 1990s  . Thyroid nodule   . Type II diabetes mellitus ([place])   . Urticaria     Past Surgical History:  Procedure Laterality Date  . ABDOMINAL HYSTERECTOMY    . BIOPSY THYROID    . CESAREAN SECTION    . CORONARY ANGIOPLASTY WITH STENT PLACEMENT  2014 & 2015  . TONSILLECTOMY AND ADENOIDECTOMY    . TRIGGER FINGER RELEASE Right    "pointer"    Current Medications: Current Meds  Medication Sig  . albuterol (PROVENTIL HFA;VENTOLIN HFA) 108 (90 Base) MCG/ACT inhaler Inhale 1-2 puffs into the lungs every 6 (six) hours as needed for wheezing or shortness of breath.  Marland Kitchen atorvastatin (LIPITOR) 80 MG tablet Take 80 mg by mouth daily.  . carvedilol (COREG) 6.25 MG tablet Take 1 tablet (6.25 mg total) by mouth 2 (two) times daily.  . Cyanocobalamin (VITAMIN B-12 PO) Take 1 tablet by mouth daily.  . dabigatran (PRADAXA) 150 MG CAPS capsule Take 150 mg by mouth 2 (two) times daily.  . diphenhydrAMINE (BENADRYL) 25 MG tablet Take 25 mg by mouth every 6 (six)  hours as needed.  . diphenhydramine-acetaminophen (TYLENOL PM) 25-500 MG TABS tablet Take 1 tablet by mouth at bedtime as needed.  . dronedarone (MULTAQ) 400 MG tablet Take 1 tablet (400 mg total) by mouth 2 (two) times daily with a meal.  . DULoxetine (CYMBALTA) 60 MG capsule Take 60 mg by mouth at bedtime.  Marland Kitchen ezetimibe (ZETIA) 10 MG tablet Take 10 mg by mouth daily.  . hydrocortisone cream 1 % Apply topically as needed for itching.  Marland Kitchen JARDIANCE 25 MG TABS tablet Take 25 mg by mouth daily.  Marland Kitchen lisinopril (PRINIVIL,ZESTRIL) 40 MG tablet Take 40 mg by mouth daily.  . metFORMIN (GLUCOPHAGE) 1000 MG tablet Take 1,000 mg by mouth 2 (two) times daily with a meal.  . Multiple Vitamins-Minerals (MULTIVITAMIN GUMMIES ADULTS) CHEW Chew 1 tablet by mouth daily.   . nitroGLYCERIN (NITROSTAT) 0.4 MG SL tablet Place 0.4 mg under the tongue every 5 (five) minutes as needed for chest pain.     Allergies:   Codeine; Onion; Tomato; Chocolate; Demerol [meperidine]; and Morphine and related   Social History   Socioeconomic History  . Marital status: Married    Spouse name: Not on file  . Number of children: Not on file  . Years of education: Not on file  . Highest education level: Not on file  Occupational History  . Not on file  Social Needs  . Financial resource strain: Not on file  . Food insecurity:    Worry: Not on file    Inability: Not on file  . Transportation needs:    Medical: Not on file    Non-medical: Not on file  Tobacco Use  . Smoking status: Never Smoker  . Smokeless tobacco: Never Used  Substance and Sexual Activity  . Alcohol use: Yes    Alcohol/week: 1.0 standard drinks    Types: 1 Glasses of wine per week  . Drug use: No  . Sexual activity: Yes    Comment: married  Lifestyle  . Physical activity:    Days per week: Not on file    Minutes per session: Not on file  . Stress: Not on file  Relationships  . Social connections:    Talks on phone: Not on file    Gets together: Not on file    Attends religious service: Not on file    Active member of club or organization: Not on file    Attends meetings of clubs or organizations: Not on file    Relationship status: Not on file  Other Topics Concern  . Not on file  Social History Narrative  . Not on file     Family History: The patient's family history includes CAD in her father; Cancer - Ovarian in her sister; Diabetes in her maternal grandmother and paternal grandfather; Healthy in her son; Heart attack in her father; Heart disease in her paternal grandmother; Hyperlipidemia in her mother; Obesity in her daughter.  ROS:   Please see the history of present illness.    Anxiety disorder, neuropathic pain, and GERD. All other systems reviewed and are negative.   EKGs/Labs/Other Studies Reviewed:    The following studies were reviewed today: 2 D Doppler Echocardiogram 04/04/2018: Study Conclusions  - Left ventricle: The cavity size was normal. There was mild focal   basal hypertrophy of the septum. Systolic function was normal.   The estimated ejection fraction was in the range of 60% to 65%.   Wall motion was normal; there were no regional wall  motion   abnormalities. Doppler parameters are consistent with abnormal   left ventricular relaxation (grade 1 diastolic dysfunction). - Aortic valve: Trileaflet; mildly thickened, mildly calcified   leaflets. - Left atrium: The atrium was mildly dilated. Volume/bsa, ES,   (1-plane Simpson&'s, A2C): 38.1 ml/m^2.  EKG:  EKG sinus bradycardia, 51 bpm, normal PR interval, otherwise normal.  Recent Labs: No results found for requested labs within last 8760 hours.  Recent Lipid Panel    Component Value Date/Time   CHOL 179 04/04/2016 1540   TRIG 180 (H) 04/04/2016 1540   HDL 47 04/04/2016 1540   CHOLHDL 3.8 04/04/2016 1540   LDLCALC 96 04/04/2016 1540    Physical Exam:    VS:  BP (!) 152/94   Pulse (!) 51   Ht 5\' 8"  (1.727 m)   Wt 202 lb 9.6 oz (91.9 kg)   SpO2 97%   BMI 30.81 kg/m     Wt Readings from Last 3 Encounters:  07/05/18 202 lb 9.6 oz (91.9 kg)  07/02/18 199 lb (90.3 kg)  03/09/17 209 lb (94.8 kg)     GEN: Moderate obesity. No acute distress HEENT: Normal NECK: No JVD. LYMPHATICS: No lymphadenopathy CARDIAC: RRR.  2/6 systolic aortic outflow murmur, no gallop, no edema VASCULAR: Radials are 2+, carotids are 2+, and dorsalis pedis 2+ pulses, no carotid bruits RESPIRATORY:  Clear to auscultation without rales, wheezing or rhonchi  ABDOMEN: Soft, non-tender, non-distended, No pulsatile mass, MUSCULOSKELETAL: No deformity  SKIN: Warm and dry NEUROLOGIC:  Alert and oriented x 3 PSYCHIATRIC:  Normal affect   ASSESSMENT:    1. Unstable angina ([place])   2. Essential  hypertension   3. Type 2 diabetes mellitus with other circulatory complication, without long-term current use of insulin ([place])   4. PAF (paroxysmal atrial fibrillation) ([place])   5. Chronic anticoagulation   6. 2019 novel coronavirus disease (COVID-19)    PLAN:    In order of problems listed above:  1. Crescendo anginal pattern in this patient with multiple risk factors for coronary artery disease including emotional stress.  I have recommended that she undergo coronary angiography within the next 7 days.  I have further recommended that she use sublingual nitroglycerin if prolonged episodes of chest discomfort.  If discomfort is not relieved by nitroglycerin she should report to the emergency room.  Otherwise we will plan to proceed with diagnostic coronary angiography after she has held Pradaxa for at least 48 hours.  The procedure and risks were discussed in detail including stroke, death, myocardial infarction, ischemia of limb, kidney injury, and the possibility of emergency bypass surgery.  We discussed the risk of bleeding and our preference for radial approach over femoral if possible.  She wants to be heavily sedated. 2. The blood pressure is elevated and today we have added amlodipine 5 mg/day 3. Hemoglobin A1c target should be less than 7 4. She is currently in sinus rhythm.  Risk of stroke off Pradaxa will be low.  She is on antiarrhythmic/rhythm controlling therapy with Multaq. 5. As noted above, Pradaxa will be held for 48 hours prior to the procedure.  COVID testing will be done as part of her pre-procedure work-up.   Medication Adjustments/Labs and Tests Ordered: Current medicines are reviewed at length with the patient today.  Concerns regarding medicines are outlined above.  Orders Placed This Encounter  Procedures  . Basic metabolic panel  . CBC  . EKG 12-Lead   Meds ordered this encounter  Medications  .  amLODipine (NORVASC) 5 MG tablet    Sig: Take 1 tablet (5 mg  total) by mouth daily.    Dispense:  90 tablet    Refill:  3    Patient Instructions  Medication Instructions:  1) START Amlodipine 5mg  once daily  If you need a refill on your cardiac medications before your next appointment, please call your pharmacy.   Lab work: BMET and CBC today  If you have labs (blood work) drawn today and your tests are completely normal, you will receive your results only by: Marland Kitchen MyChart Message (if you have MyChart) OR . A paper copy in the mail If you have any lab test that is abnormal or we need to change your treatment, we will call you to review the results.  Testing/Procedures: Your physician has requested that you have a cardiac catheterization. Cardiac catheterization is used to diagnose and/or treat various heart conditions. Doctors may recommend this procedure for a number of different reasons. The most common reason is to evaluate chest pain. Chest pain can be a symptom of coronary artery disease (CAD), and cardiac catheterization can show whether plaque is narrowing or blocking your heart's arteries. This procedure is also used to evaluate the valves, as well as measure the blood flow and oxygen levels in different parts of your heart. For further information please visit [email or web address]. Please follow instruction sheet, as given.   Follow-Up: Will be based on results of cath.  Any Other Special Instructions Will Be Listed Below (If Applicable).     [facility] OFFICE [place], [place] [address] Dept: [phone] Loc: [place]  07/05/2018  You are scheduled for a Cardiac Catheterization on Monday, May 11 with Dr. Daneen Schick.  1. Please arrive at the [facility] (Main Entrance A) at [facility]: [address] [place], [place] [address] at 5:30 AM (This time is two hours before your procedure to ensure your  preparation). Free valet parking service is available.   Special note: Every effort is made to have your procedure done on time. Please understand that emergencies sometimes delay scheduled procedures.  2. Diet: Do not eat solid foods after midnight.  The patient may have clear liquids until 5am upon the day of the procedure.  3. Labs: You will have labs drawn today.  4. Medication instructions in preparation for your procedure:   Contrast Allergy: No   Stop taking Pradaxa (Dabigatran) on Saturday, May 9.  Do not take Metformin 24 hours before or 48 hours after your procedure.  Do not take Jardiance the morning of your procedure.   On the morning of your procedure, take your Aspirin and any morning medicines NOT listed above.  You may use sips of water.  5. Plan for one night stay--bring personal belongings. 6. Bring a current list of your medications and current insurance cards. 7. You MUST have a responsible person to drive you home. 8. Someone MUST be with you the first 24 hours after you arrive home or your discharge will be delayed. 9. Please wear clothes that are easy to get on and off and wear slip-on shoes.  Thank you for allowing [place] to care for you!   -- [facility]       Signed, Sinclair Grooms, MD  07/05/2018 9:01 AM    [place]

## 2018-07-04 NOTE — Progress Notes (Signed)
Cardiology Office Note:    Date:  07/05/2018   ID:  Brittany Buckley, DOB May 30, 1955, MRN [id number]  PCP:  Marton Redwood, MD  Cardiologist:  Mertie Moores, MD   Referring MD: Marton Redwood, MD   Chief Complaint  Patient presents with  . Coronary Artery Disease    Angina pectoris    History of Present Illness:    Brittany Buckley is a 63 y.o. female with a hx of PAF on Multaq, chronic anticoagulation Pradaxa, CAD with MI x 2 (2013 and 2014), prior DES x 2014, hypertension, hyperlipidemia, and DM II.   The patient is a high Microbiologist and independent school systems.  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  She is under a lot of stress both with her job, at the [employer], and her PhD work.  She has a prior history of coronary disease as noted above.  Over the past 10 to 14 days she has had intermittent episodes of chest tightness.  10 days ago she had a particularly severe episode and required 2 nitroglycerin tablets.  The discomfort started at rest.  Eventually, the nitroglycerin relieved the discomfort after 15 to 20 minutes.  Since her most recent stent in 2016 she has had intermittent episodes of chest tightness but usually only with physical activity.  Because of this change she is concerned and was seen via virtual telehealth visit by Ellen Henri within the past 7 days.  She was noted to have elevated blood pressure and adjustments were made.  Low-dose beta-blocker therapy was started.  No side effects have been noted.  Past Medical History:  Diagnosis Date  . A-fib ([place])   . ADD (attention deficit disorder)   . Anginal pain ([place])   . Anxiety   . Arthritis    "hands, feet" (06/14/2016)  . Asthma   . Chronic lower back pain   . Chronic neck pain   . Coronary artery disease   . GERD (gastroesophageal reflux disease)   . Headache    "monthly" (06/14/2016)  . Heart murmur   . Hyperlipidemia   . Hypertension   . MI  (myocardial infarction) ([place]) 2014; 2015  . Migraine    "probably twice/year" (06/14/2016)  . Numbness of toes   . Obesity   . On anticoagulant therapy   . Peripheral neuropathy   . Pneumonia 1990s  . Thyroid nodule   . Type II diabetes mellitus ([place])   . Urticaria     Past Surgical History:  Procedure Laterality Date  . ABDOMINAL HYSTERECTOMY    . BIOPSY THYROID    . CESAREAN SECTION    . CORONARY ANGIOPLASTY WITH STENT PLACEMENT  2014 & 2015  . TONSILLECTOMY AND ADENOIDECTOMY    . TRIGGER FINGER RELEASE Right    "pointer"    Current Medications: Current Meds  Medication Sig  . albuterol (PROVENTIL HFA;VENTOLIN HFA) 108 (90 Base) MCG/ACT inhaler Inhale 1-2 puffs into the lungs every 6 (six) hours as needed for wheezing or shortness of breath.  Marland Kitchen atorvastatin (LIPITOR) 80 MG tablet Take 80 mg by mouth daily.  . carvedilol (COREG) 6.25 MG tablet Take 1 tablet (6.25 mg total) by mouth 2 (two) times daily.  . Cyanocobalamin (VITAMIN B-12 PO) Take 1 tablet by mouth daily.  . dabigatran (PRADAXA) 150 MG CAPS capsule Take 150 mg by mouth 2 (two) times daily.  . diphenhydrAMINE (BENADRYL) 25 MG tablet Take 25 mg by mouth every 6 (six)  hours as needed.  . diphenhydramine-acetaminophen (TYLENOL PM) 25-500 MG TABS tablet Take 1 tablet by mouth at bedtime as needed.  . dronedarone (MULTAQ) 400 MG tablet Take 1 tablet (400 mg total) by mouth 2 (two) times daily with a meal.  . DULoxetine (CYMBALTA) 60 MG capsule Take 60 mg by mouth at bedtime.  Marland Kitchen ezetimibe (ZETIA) 10 MG tablet Take 10 mg by mouth daily.  . hydrocortisone cream 1 % Apply topically as needed for itching.  Marland Kitchen JARDIANCE 25 MG TABS tablet Take 25 mg by mouth daily.  Marland Kitchen lisinopril (PRINIVIL,ZESTRIL) 40 MG tablet Take 40 mg by mouth daily.  . metFORMIN (GLUCOPHAGE) 1000 MG tablet Take 1,000 mg by mouth 2 (two) times daily with a meal.  . Multiple Vitamins-Minerals (MULTIVITAMIN GUMMIES ADULTS) CHEW Chew 1 tablet by mouth daily.   . nitroGLYCERIN (NITROSTAT) 0.4 MG SL tablet Place 0.4 mg under the tongue every 5 (five) minutes as needed for chest pain.     Allergies:   Codeine; Onion; Tomato; Chocolate; Demerol [meperidine]; and Morphine and related   Social History   Socioeconomic History  . Marital status: Married    Spouse name: Not on file  . Number of children: Not on file  . Years of education: Not on file  . Highest education level: Not on file  Occupational History  . Not on file  Social Needs  . Financial resource strain: Not on file  . Food insecurity:    Worry: Not on file    Inability: Not on file  . Transportation needs:    Medical: Not on file    Non-medical: Not on file  Tobacco Use  . Smoking status: Never Smoker  . Smokeless tobacco: Never Used  Substance and Sexual Activity  . Alcohol use: Yes    Alcohol/week: 1.0 standard drinks    Types: 1 Glasses of wine per week  . Drug use: No  . Sexual activity: Yes    Comment: married  Lifestyle  . Physical activity:    Days per week: Not on file    Minutes per session: Not on file  . Stress: Not on file  Relationships  . Social connections:    Talks on phone: Not on file    Gets together: Not on file    Attends religious service: Not on file    Active member of club or organization: Not on file    Attends meetings of clubs or organizations: Not on file    Relationship status: Not on file  Other Topics Concern  . Not on file  Social History Narrative  . Not on file     Family History: The patient's family history includes CAD in her father; Cancer - Ovarian in her sister; Diabetes in her maternal grandmother and paternal grandfather; Healthy in her son; Heart attack in her father; Heart disease in her paternal grandmother; Hyperlipidemia in her mother; Obesity in her daughter.  ROS:   Please see the history of present illness.    Anxiety disorder, neuropathic pain, and GERD. All other systems reviewed and are negative.   EKGs/Labs/Other Studies Reviewed:    The following studies were reviewed today: 2 D Doppler Echocardiogram 04/04/2018: Study Conclusions  - Left ventricle: The cavity size was normal. There was mild focal   basal hypertrophy of the septum. Systolic function was normal.   The estimated ejection fraction was in the range of 60% to 65%.   Wall motion was normal; there were no regional wall  motion   abnormalities. Doppler parameters are consistent with abnormal   left ventricular relaxation (grade 1 diastolic dysfunction). - Aortic valve: Trileaflet; mildly thickened, mildly calcified   leaflets. - Left atrium: The atrium was mildly dilated. Volume/bsa, ES,   (1-plane Simpson&'s, A2C): 38.1 ml/m^2.  EKG:  EKG sinus bradycardia, 51 bpm, normal PR interval, otherwise normal.  Recent Labs: No results found for requested labs within last 8760 hours.  Recent Lipid Panel    Component Value Date/Time   CHOL 179 04/04/2016 1540   TRIG 180 (H) 04/04/2016 1540   HDL 47 04/04/2016 1540   CHOLHDL 3.8 04/04/2016 1540   LDLCALC 96 04/04/2016 1540    Physical Exam:    VS:  BP (!) 152/94   Pulse (!) 51   Ht 5\' 8"  (1.727 m)   Wt 202 lb 9.6 oz (91.9 kg)   SpO2 97%   BMI 30.81 kg/m     Wt Readings from Last 3 Encounters:  07/05/18 202 lb 9.6 oz (91.9 kg)  07/02/18 199 lb (90.3 kg)  03/09/17 209 lb (94.8 kg)     GEN: Moderate obesity. No acute distress HEENT: Normal NECK: No JVD. LYMPHATICS: No lymphadenopathy CARDIAC: RRR.  2/6 systolic aortic outflow murmur, no gallop, no edema VASCULAR: Radials are 2+, carotids are 2+, and dorsalis pedis 2+ pulses, no carotid bruits RESPIRATORY:  Clear to auscultation without rales, wheezing or rhonchi  ABDOMEN: Soft, non-tender, non-distended, No pulsatile mass, MUSCULOSKELETAL: No deformity  SKIN: Warm and dry NEUROLOGIC:  Alert and oriented x 3 PSYCHIATRIC:  Normal affect   ASSESSMENT:    1. Unstable angina ([place])   2. Essential  hypertension   3. Type 2 diabetes mellitus with other circulatory complication, without long-term current use of insulin ([place])   4. PAF (paroxysmal atrial fibrillation) ([place])   5. Chronic anticoagulation   6. 2019 novel coronavirus disease (COVID-19)    PLAN:    In order of problems listed above:  1. Crescendo anginal pattern in this patient with multiple risk factors for coronary artery disease including emotional stress.  I have recommended that she undergo coronary angiography within the next 7 days.  I have further recommended that she use sublingual nitroglycerin if prolonged episodes of chest discomfort.  If discomfort is not relieved by nitroglycerin she should report to the emergency room.  Otherwise we will plan to proceed with diagnostic coronary angiography after she has held Pradaxa for at least 48 hours.  The procedure and risks were discussed in detail including stroke, death, myocardial infarction, ischemia of limb, kidney injury, and the possibility of emergency bypass surgery.  We discussed the risk of bleeding and our preference for radial approach over femoral if possible.  She wants to be heavily sedated. 2. The blood pressure is elevated and today we have added amlodipine 5 mg/day 3. Hemoglobin A1c target should be less than 7 4. She is currently in sinus rhythm.  Risk of stroke off Pradaxa will be low.  She is on antiarrhythmic/rhythm controlling therapy with Multaq. 5. As noted above, Pradaxa will be held for 48 hours prior to the procedure.  COVID testing will be done as part of her pre-procedure work-up.   Medication Adjustments/Labs and Tests Ordered: Current medicines are reviewed at length with the patient today.  Concerns regarding medicines are outlined above.  Orders Placed This Encounter  Procedures  . Basic metabolic panel  . CBC  . EKG 12-Lead   Meds ordered this encounter  Medications  .  amLODipine (NORVASC) 5 MG tablet    Sig: Take 1 tablet (5 mg  total) by mouth daily.    Dispense:  90 tablet    Refill:  3    Patient Instructions  Medication Instructions:  1) START Amlodipine 5mg  once daily  If you need a refill on your cardiac medications before your next appointment, please call your pharmacy.   Lab work: BMET and CBC today  If you have labs (blood work) drawn today and your tests are completely normal, you will receive your results only by: Marland Kitchen MyChart Message (if you have MyChart) OR . A paper copy in the mail If you have any lab test that is abnormal or we need to change your treatment, we will call you to review the results.  Testing/Procedures: Your physician has requested that you have a cardiac catheterization. Cardiac catheterization is used to diagnose and/or treat various heart conditions. Doctors may recommend this procedure for a number of different reasons. The most common reason is to evaluate chest pain. Chest pain can be a symptom of coronary artery disease (CAD), and cardiac catheterization can show whether plaque is narrowing or blocking your heart's arteries. This procedure is also used to evaluate the valves, as well as measure the blood flow and oxygen levels in different parts of your heart. For further information please visit HugeFiesta.tn. Please follow instruction sheet, as given.   Follow-Up: Will be based on results of cath.  Any Other Special Instructions Will Be Listed Below (If Applicable).     [facility] OFFICE [place], [place] [address] Dept: [phone] Loc: [place]  07/05/2018  You are scheduled for a Cardiac Catheterization on Monday, May 11 with Dr. Daneen Schick.  1. Please arrive at the [facility] (Main Entrance A) at [facility]: [address] [place], [place] [address] at 5:30 AM (This time is two hours before your procedure to ensure your  preparation). Free valet parking service is available.   Special note: Every effort is made to have your procedure done on time. Please understand that emergencies sometimes delay scheduled procedures.  2. Diet: Do not eat solid foods after midnight.  The patient may have clear liquids until 5am upon the day of the procedure.  3. Labs: You will have labs drawn today.  4. Medication instructions in preparation for your procedure:   Contrast Allergy: No   Stop taking Pradaxa (Dabigatran) on Saturday, May 9.  Do not take Metformin 24 hours before or 48 hours after your procedure.  Do not take Jardiance the morning of your procedure.   On the morning of your procedure, take your Aspirin and any morning medicines NOT listed above.  You may use sips of water.  5. Plan for one night stay--bring personal belongings. 6. Bring a current list of your medications and current insurance cards. 7. You MUST have a responsible person to drive you home. 8. Someone MUST be with you the first 24 hours after you arrive home or your discharge will be delayed. 9. Please wear clothes that are easy to get on and off and wear slip-on shoes.  Thank you for allowing [place] to care for you!   -- [facility]       Signed, Sinclair Grooms, MD  07/05/2018 9:01 AM    [place]

## 2018-07-05 ENCOUNTER — Other Ambulatory Visit: Payer: Self-pay

## 2018-07-05 ENCOUNTER — Encounter: Payer: Self-pay | Admitting: Interventional Cardiology

## 2018-07-05 ENCOUNTER — Ambulatory Visit (INDEPENDENT_AMBULATORY_CARE_PROVIDER_SITE_OTHER): Payer: Commercial Managed Care - PPO | Admitting: Interventional Cardiology

## 2018-07-05 ENCOUNTER — Telehealth: Payer: Commercial Managed Care - PPO | Admitting: Cardiology

## 2018-07-05 VITALS — BP 152/94 | HR 51 | Ht 68.0 in | Wt 202.6 lb

## 2018-07-05 DIAGNOSIS — I480 Paroxysmal atrial fibrillation: Secondary | ICD-10-CM | POA: Diagnosis not present

## 2018-07-05 DIAGNOSIS — I200 Unstable angina: Secondary | ICD-10-CM

## 2018-07-05 DIAGNOSIS — Z7901 Long term (current) use of anticoagulants: Secondary | ICD-10-CM

## 2018-07-05 DIAGNOSIS — E1159 Type 2 diabetes mellitus with other circulatory complications: Secondary | ICD-10-CM

## 2018-07-05 DIAGNOSIS — I10 Essential (primary) hypertension: Secondary | ICD-10-CM | POA: Diagnosis not present

## 2018-07-05 DIAGNOSIS — U071 COVID-19: Secondary | ICD-10-CM

## 2018-07-05 LAB — BASIC METABOLIC PANEL
BUN/Creatinine Ratio: 22 (ref 12–28)
BUN: 17 mg/dL (ref 8–27)
CO2: 18 mmol/L — ABNORMAL LOW (ref 20–29)
Calcium: 9 mg/dL (ref 8.7–10.3)
Chloride: 104 mmol/L (ref 96–106)
Creatinine, Ser: 0.77 mg/dL (ref 0.57–1.00)
GFR calc Af Amer: 96 mL/min/{1.73_m2} (ref >59)
GFR calc non Af Amer: 83 mL/min/{1.73_m2} (ref >59)
Glucose: 139 mg/dL — ABNORMAL HIGH (ref 65–99)
Potassium: 4 mmol/L (ref 3.5–5.2)
Sodium: 138 mmol/L (ref 134–144)

## 2018-07-05 LAB — CBC
Hematocrit: 41.6 % (ref 34.0–46.6)
Hemoglobin: 13.7 g/dL (ref 11.1–15.9)
MCH: 28 pg (ref 26.6–33.0)
MCHC: 32.9 g/dL (ref 31.5–35.7)
MCV: 85 fL (ref 79–97)
Platelets: 205 10*3/uL (ref 150–450)
RBC: 4.9 x10E6/uL (ref 3.77–5.28)
RDW: 14.9 % (ref 11.7–15.4)
WBC: 6.9 10*3/uL (ref 3.4–10.8)

## 2018-07-05 MED ORDER — AMLODIPINE BESYLATE 5 MG PO TABS: 5.0000 mg | ORAL_TABLET | Freq: Every day | ORAL | 3 refills | Status: DC

## 2018-07-05 NOTE — Patient Instructions (Addendum)
Medication Instructions:  1) START Amlodipine 5mg  once daily  If you need a refill on your cardiac medications before your next appointment, please call your pharmacy.   Lab work: BMET and CBC today  If you have labs (blood work) drawn today and your tests are completely normal, you will receive your results only by: Marland Kitchen MyChart Message (if you have MyChart) OR . A paper copy in the mail If you have any lab test that is abnormal or we need to change your treatment, we will call you to review the results.  Testing/Procedures: Your physician has requested that you have a cardiac catheterization. Cardiac catheterization is used to diagnose and/or treat various heart conditions. Doctors may recommend this procedure for a number of different reasons. The most common reason is to evaluate chest pain. Chest pain can be a symptom of coronary artery disease (CAD), and cardiac catheterization can show whether plaque is narrowing or blocking your heart's arteries. This procedure is also used to evaluate the valves, as well as measure the blood flow and oxygen levels in different parts of your heart. For further information please visit HugeFiesta.tn. Please follow instruction sheet, as given.   Follow-Up: Will be based on results of cath.  Any Other Special Instructions Will Be Listed Below (If Applicable).     [facility] OFFICE [place], [place] [address] Dept: [phone] Loc: [place]  07/05/2018  You are scheduled for a Cardiac Catheterization on Monday, May 11 with Dr. Daneen Schick.  1. Please arrive at the [facility] (Main Entrance A) at [facility]: [address] [place], [place] [address] at 5:30 AM (This time is two hours before your procedure to ensure your preparation). Free valet parking service is available.   Special note: Every effort is made  to have your procedure done on time. Please understand that emergencies sometimes delay scheduled procedures.  2. Diet: Do not eat solid foods after midnight.  The patient may have clear liquids until 5am upon the day of the procedure.  3. Labs: You will have labs drawn today.  4. Medication instructions in preparation for your procedure:   Contrast Allergy: No   Stop taking Pradaxa (Dabigatran) on Saturday, May 9.  Do not take Metformin 24 hours before or 48 hours after your procedure.  Do not take Jardiance the morning of your procedure.   On the morning of your procedure, take your Aspirin and any morning medicines NOT listed above.  You may use sips of water.  5. Plan for one night stay--bring personal belongings. 6. Bring a current list of your medications and current insurance cards. 7. You MUST have a responsible person to drive you home. 8. Someone MUST be with you the first 24 hours after you arrive home or your discharge will be delayed. 9. Please wear clothes that are easy to get on and off and wear slip-on shoes.  Thank you for allowing [place] to care for you!   -- [facility]

## 2018-07-06 ENCOUNTER — Other Ambulatory Visit (HOSPITAL_COMMUNITY)
Admission: RE | Admit: 2018-07-06 | Discharge: 2018-07-06 | Disposition: A | Discharge status: Home / Self Care | Payer: Commercial Managed Care - PPO | Source: Ambulatory Visit | Attending: Interventional Cardiology | Admitting: Interventional Cardiology

## 2018-07-06 DIAGNOSIS — Z1159 Encounter for screening for other viral diseases: Secondary | ICD-10-CM | POA: Insufficient documentation

## 2018-07-07 LAB — NOVEL CORONAVIRUS, NAA (HOSP ORDER, SEND-OUT TO REF LAB; TAT 18-24 HRS): SARS-CoV-2, NAA: NOT DETECTED

## 2018-07-08 NOTE — Care Management (Signed)
Errpr

## 2018-07-09 ENCOUNTER — Encounter (HOSPITAL_COMMUNITY): Payer: Self-pay | Admitting: Interventional Cardiology

## 2018-07-09 ENCOUNTER — Ambulatory Visit (HOSPITAL_COMMUNITY)
Admission: RE | Admit: 2018-07-09 | Discharge: 2018-07-09 | Disposition: A | Discharge status: Home / Self Care | Payer: Commercial Managed Care - PPO | Attending: Interventional Cardiology | Admitting: Interventional Cardiology

## 2018-07-09 ENCOUNTER — Encounter (HOSPITAL_COMMUNITY)
Admission: RE | Disposition: A | Discharge status: Home / Self Care | Payer: Commercial Managed Care - PPO | Source: Home / Self Care | Attending: Interventional Cardiology

## 2018-07-09 ENCOUNTER — Other Ambulatory Visit: Payer: Self-pay

## 2018-07-09 DIAGNOSIS — Z79899 Other long term (current) drug therapy: Secondary | ICD-10-CM | POA: Insufficient documentation

## 2018-07-09 DIAGNOSIS — Z955 Presence of coronary angioplasty implant and graft: Secondary | ICD-10-CM | POA: Insufficient documentation

## 2018-07-09 DIAGNOSIS — Z8249 Family history of ischemic heart disease and other diseases of the circulatory system: Secondary | ICD-10-CM | POA: Insufficient documentation

## 2018-07-09 DIAGNOSIS — I209 Angina pectoris, unspecified: Secondary | ICD-10-CM | POA: Diagnosis present

## 2018-07-09 DIAGNOSIS — I25119 Atherosclerotic heart disease of native coronary artery with unspecified angina pectoris: Secondary | ICD-10-CM | POA: Diagnosis not present

## 2018-07-09 DIAGNOSIS — Z90710 Acquired absence of both cervix and uterus: Secondary | ICD-10-CM | POA: Insufficient documentation

## 2018-07-09 DIAGNOSIS — I480 Paroxysmal atrial fibrillation: Secondary | ICD-10-CM | POA: Insufficient documentation

## 2018-07-09 DIAGNOSIS — Z6830 Body mass index (BMI) 30.0-30.9, adult: Secondary | ICD-10-CM | POA: Insufficient documentation

## 2018-07-09 DIAGNOSIS — T82856A Stenosis of peripheral vascular stent, initial encounter: Secondary | ICD-10-CM | POA: Diagnosis not present

## 2018-07-09 DIAGNOSIS — Z885 Allergy status to narcotic agent status: Secondary | ICD-10-CM | POA: Insufficient documentation

## 2018-07-09 DIAGNOSIS — I252 Old myocardial infarction: Secondary | ICD-10-CM | POA: Diagnosis not present

## 2018-07-09 DIAGNOSIS — E1140 Type 2 diabetes mellitus with diabetic neuropathy, unspecified: Secondary | ICD-10-CM | POA: Insufficient documentation

## 2018-07-09 DIAGNOSIS — I10 Essential (primary) hypertension: Secondary | ICD-10-CM | POA: Insufficient documentation

## 2018-07-09 DIAGNOSIS — E7800 Pure hypercholesterolemia, unspecified: Secondary | ICD-10-CM | POA: Diagnosis present

## 2018-07-09 DIAGNOSIS — J45909 Unspecified asthma, uncomplicated: Secondary | ICD-10-CM | POA: Diagnosis not present

## 2018-07-09 DIAGNOSIS — I2510 Atherosclerotic heart disease of native coronary artery without angina pectoris: Secondary | ICD-10-CM | POA: Diagnosis present

## 2018-07-09 DIAGNOSIS — Z7984 Long term (current) use of oral hypoglycemic drugs: Secondary | ICD-10-CM | POA: Diagnosis not present

## 2018-07-09 DIAGNOSIS — E785 Hyperlipidemia, unspecified: Secondary | ICD-10-CM | POA: Insufficient documentation

## 2018-07-09 DIAGNOSIS — E1159 Type 2 diabetes mellitus with other circulatory complications: Secondary | ICD-10-CM | POA: Insufficient documentation

## 2018-07-09 DIAGNOSIS — Z833 Family history of diabetes mellitus: Secondary | ICD-10-CM | POA: Diagnosis not present

## 2018-07-09 DIAGNOSIS — M1990 Unspecified osteoarthritis, unspecified site: Secondary | ICD-10-CM | POA: Diagnosis not present

## 2018-07-09 DIAGNOSIS — Z7901 Long term (current) use of anticoagulants: Secondary | ICD-10-CM | POA: Insufficient documentation

## 2018-07-09 DIAGNOSIS — E669 Obesity, unspecified: Secondary | ICD-10-CM | POA: Diagnosis not present

## 2018-07-09 DIAGNOSIS — K219 Gastro-esophageal reflux disease without esophagitis: Secondary | ICD-10-CM | POA: Diagnosis not present

## 2018-07-09 DIAGNOSIS — E119 Type 2 diabetes mellitus without complications: Secondary | ICD-10-CM

## 2018-07-09 HISTORY — PX: LEFT HEART CATH AND CORONARY ANGIOGRAPHY: CATH118249

## 2018-07-09 LAB — GLUCOSE, CAPILLARY: Glucose-Capillary: 133 mg/dL — ABNORMAL HIGH (ref 70–99)

## 2018-07-09 SURGERY — LEFT HEART CATH AND CORONARY ANGIOGRAPHY
Anesthesia: LOCAL

## 2018-07-09 MED ORDER — SODIUM CHLORIDE 0.9% FLUSH: 3.0000 mL | Freq: Two times a day (BID) | INTRAVENOUS | Status: DC

## 2018-07-09 MED ORDER — SODIUM CHLORIDE 0.9% FLUSH: 3.0000 mL | INTRAVENOUS | Status: DC | PRN

## 2018-07-09 MED ORDER — HYDRALAZINE HCL 20 MG/ML IJ SOLN: 10.0000 mg | INTRAMUSCULAR | Status: DC | PRN

## 2018-07-09 MED ORDER — FENTANYL CITRATE (PF) 100 MCG/2ML IJ SOLN
INTRAMUSCULAR | Status: AC
  Filled 2018-07-09: qty 2

## 2018-07-09 MED ORDER — LIDOCAINE HCL (PF) 1 % IJ SOLN
INTRAMUSCULAR | Status: DC | PRN
  Administered 2018-07-09: 5 mL via INTRADERMAL

## 2018-07-09 MED ORDER — SODIUM CHLORIDE 0.9 % IV SOLN: 250.0000 mL | INTRAVENOUS | Status: DC | PRN

## 2018-07-09 MED ORDER — LIDOCAINE HCL (PF) 1 % IJ SOLN
INTRAMUSCULAR | Status: AC
  Filled 2018-07-09: qty 30

## 2018-07-09 MED ORDER — HEPARIN SODIUM (PORCINE) 1000 UNIT/ML IJ SOLN
INTRAMUSCULAR | Status: DC | PRN
  Administered 2018-07-09: 4500 [IU] via INTRAVENOUS

## 2018-07-09 MED ORDER — IOHEXOL 350 MG/ML SOLN
INTRAVENOUS | Status: DC | PRN
  Administered 2018-07-09: 60 mL via INTRAVENOUS

## 2018-07-09 MED ORDER — ONDANSETRON HCL 4 MG/2ML IJ SOLN: 4.0000 mg | Freq: Four times a day (QID) | INTRAMUSCULAR | Status: DC | PRN

## 2018-07-09 MED ORDER — LABETALOL HCL 5 MG/ML IV SOLN: 10.0000 mg | INTRAVENOUS | Status: DC | PRN

## 2018-07-09 MED ORDER — VERAPAMIL HCL 2.5 MG/ML IV SOLN
INTRAVENOUS | Status: AC
  Filled 2018-07-09: qty 2

## 2018-07-09 MED ORDER — ACETAMINOPHEN 325 MG PO TABS: 650.0000 mg | ORAL_TABLET | ORAL | Status: DC | PRN

## 2018-07-09 MED ORDER — MIDAZOLAM HCL 2 MG/2ML IJ SOLN
INTRAMUSCULAR | Status: AC
  Filled 2018-07-09: qty 2

## 2018-07-09 MED ORDER — SODIUM CHLORIDE 0.9 % WEIGHT BASED INFUSION
3.0000 mL/kg/h | INTRAVENOUS | Status: AC
  Administered 2018-07-09: 3 mL/kg/h via INTRAVENOUS

## 2018-07-09 MED ORDER — HEPARIN SODIUM (PORCINE) 1000 UNIT/ML IJ SOLN
INTRAMUSCULAR | Status: AC
  Filled 2018-07-09: qty 1

## 2018-07-09 MED ORDER — ASPIRIN 81 MG PO CHEW
81.0000 mg | CHEWABLE_TABLET | ORAL | Status: AC
  Administered 2018-07-09: 81 mg via ORAL
  Filled 2018-07-09: qty 1

## 2018-07-09 MED ORDER — FENTANYL CITRATE (PF) 100 MCG/2ML IJ SOLN
INTRAMUSCULAR | Status: DC | PRN
  Administered 2018-07-09: 25 ug via INTRAVENOUS

## 2018-07-09 MED ORDER — HEPARIN (PORCINE) IN NACL 1000-0.9 UT/500ML-% IV SOLN
INTRAVENOUS | Status: DC | PRN
  Administered 2018-07-09 (×3): 500 mL

## 2018-07-09 MED ORDER — HEPARIN (PORCINE) IN NACL 1000-0.9 UT/500ML-% IV SOLN
INTRAVENOUS | Status: AC
  Filled 2018-07-09: qty 500

## 2018-07-09 MED ORDER — SODIUM CHLORIDE 0.9 % IV SOLN: INTRAVENOUS | Status: DC

## 2018-07-09 MED ORDER — MIDAZOLAM HCL 2 MG/2ML IJ SOLN
INTRAMUSCULAR | Status: DC | PRN
  Administered 2018-07-09: 1 mg via INTRAVENOUS

## 2018-07-09 MED ORDER — VERAPAMIL HCL 2.5 MG/ML IV SOLN
INTRAVENOUS | Status: DC | PRN
  Administered 2018-07-09: 10 mL via INTRA_ARTERIAL

## 2018-07-09 MED ORDER — SODIUM CHLORIDE 0.9 % WEIGHT BASED INFUSION: 1.0000 mL/kg/h | INTRAVENOUS | Status: DC

## 2018-07-09 MED ORDER — HEPARIN (PORCINE) IN NACL 1000-0.9 UT/500ML-% IV SOLN
INTRAVENOUS | Status: AC
  Filled 2018-07-09: qty 1000

## 2018-07-09 SURGICAL SUPPLY — 10 items
CATH 5FR JL3.5 JR4 ANG PIG MP (CATHETERS) ×2 IMPLANT
DEVICE RAD COMP TR BAND LRG (VASCULAR PRODUCTS) ×2 IMPLANT
GLIDESHEATH SLEND A-KIT 6F 22G (SHEATH) ×2 IMPLANT
GUIDEWIRE INQWIRE 1.5J.035X260 (WIRE) ×1 IMPLANT
INQWIRE 1.5J .035X260CM (WIRE) ×2
KIT HEART LEFT (KITS) ×2 IMPLANT
PACK CARDIAC CATHETERIZATION (CUSTOM PROCEDURE TRAY) ×2 IMPLANT
SHEATH PROBE COVER 6X72 (BAG) ×2 IMPLANT
TRANSDUCER W/STOPCOCK (MISCELLANEOUS) ×2 IMPLANT
TUBING CIL FLEX 10 FLL-RA (TUBING) ×2 IMPLANT

## 2018-07-09 NOTE — CV Procedure (Signed)
   Diagnostic coronary angiography via right radial with vascular ultrasound guidance for access.  Widely patent circumflex and right coronary stents.  Mild to moderate irregularities less than or equal to 50% in mid to distal right and in mid circumflex.  LAD is large and normal  LV function is normal with EF greater than 55.  LVEDP is elevated and consistent with diastolic heart failure.  Snoring after sedation raising the possibility of sleep apnea.  Continue aggressive risk factor modification.  Sleep study.

## 2018-07-09 NOTE — Interval H&P Note (Signed)
Cath Lab Visit (complete for each Cath Lab visit)  Clinical Evaluation Leading to the Procedure:   ACS: No.  Non-ACS:    Anginal Classification: CCS Buckley  Anti-ischemic medical therapy: Maximal Therapy (2 or more classes of medications)  Non-Invasive Test Results: No non-invasive testing performed  Prior CABG: No previous CABG      History and Physical Interval Note:  07/09/2018 7:38 AM  Brittany Buckley  has presented today for surgery, with the diagnosis of Angina.  The various methods of treatment have been discussed with the patient and family. After consideration of risks, benefits and other options for treatment, the patient has consented to  Procedure(s): LEFT HEART CATH AND CORONARY ANGIOGRAPHY (N/A) as a surgical intervention.  The patient's history has been reviewed, patient examined, no change in status, stable for surgery.  I have reviewed the patient's chart and labs.  Questions were answered to the patient's satisfaction.     Brittany Buckley

## 2018-07-09 NOTE — Discharge Instructions (Signed)
Radial Site Care ° °This sheet gives you information about how to care for yourself after your procedure. Your health care provider may also give you more specific instructions. If you have problems or questions, contact your health care provider. °What can I expect after the procedure? °After the procedure, it is common to have: °· Bruising and tenderness at the catheter insertion area. °Follow these instructions at home: °Medicines °· Take over-the-counter and prescription medicines only as told by your health care provider. °Insertion site care °· Follow instructions from your health care provider about how to take care of your insertion site. Make sure you: °? Wash your hands with soap and water before you change your bandage (dressing). If soap and water are not available, use hand sanitizer. °? Change your dressing as told by your health care provider. °? Leave stitches (sutures), skin glue, or adhesive strips in place. These skin closures may need to stay in place for 2 weeks or longer. If adhesive strip edges start to loosen and curl up, you may trim the loose edges. Do not remove adhesive strips completely unless your health care provider tells you to do that. °· Check your insertion site every day for signs of infection. Check for: °? Redness, swelling, or pain. °? Fluid or blood. °? Pus or a bad smell. °? Warmth. °· Do not take baths, swim, or use a hot tub until your health care provider approves. °· You may shower 24-48 hours after the procedure, or as directed by your health care provider. °? Remove the dressing and gently wash the site with plain soap and water. °? Pat the area dry with a clean towel. °? Do not rub the site. That could cause bleeding. °· Do not apply powder or lotion to the site. °Activity ° °· For 24 hours after the procedure, or as directed by your health care provider: °? Do not flex or bend the affected arm. °? Do not push or pull heavy objects with the affected arm. °? Do not  drive yourself home from the hospital or clinic. You may drive 24 hours after the procedure unless your health care provider tells you not to. °? Do not operate machinery or power tools. °· Do not lift anything that is heavier than 10 lb (4.5 kg), or the limit that you are told, until your health care provider says that it is safe. °· Ask your health care provider when it is okay to: °? Return to work or school. °? Resume usual physical activities or sports. °? Resume sexual activity. °General instructions °· If the catheter site starts to bleed, raise your arm and put firm pressure on the site. If the bleeding does not stop, get help right away. This is a medical emergency. °· If you went home on the same day as your procedure, a responsible adult should be with you for the first 24 hours after you arrive home. °· Keep all follow-up visits as told by your health care provider. This is important. °Contact a health care provider if: °· You have a fever. °· You have redness, swelling, or yellow drainage around your insertion site. °Get help right away if: °· You have unusual pain at the radial site. °· The catheter insertion area swells very fast. °· The insertion area is bleeding, and the bleeding does not stop when you hold steady pressure on the area. °· Your arm or hand becomes pale, cool, tingly, or numb. °These symptoms may represent a serious problem   that is an emergency. Do not wait to see if the symptoms will go away. Get medical help right away. Call your local emergency services (911 in the [place] Do not drive yourself to the hospital. °Summary °· After the procedure, it is common to have bruising and tenderness at the site. °· Follow instructions from your health care provider about how to take care of your radial site wound. Check the wound every day for signs of infection. °· Do not lift anything that is heavier than 10 lb (4.5 kg), or the limit that you are told, until your health care provider says  that it is safe. °This information is not intended to replace advice given to you by your health care provider. Make sure you discuss any questions you have with your health care provider. °Document Released: 03/19/2010 Document Revised: 03/22/2017 Document Reviewed: 03/22/2017 °Elsevier Interactive Patient Education © 2019 [employer] Inc[employer] ° °

## 2018-09-26 ENCOUNTER — Other Ambulatory Visit: Payer: Self-pay | Admitting: Internal Medicine

## 2018-09-26 DIAGNOSIS — Z1231 Encounter for screening mammogram for malignant neoplasm of breast: Secondary | ICD-10-CM

## 2018-09-27 ENCOUNTER — Encounter: Payer: Self-pay | Admitting: Nurse Practitioner

## 2018-10-24 ENCOUNTER — Encounter: Payer: Self-pay | Admitting: *Deleted

## 2018-10-25 ENCOUNTER — Ambulatory Visit (INDEPENDENT_AMBULATORY_CARE_PROVIDER_SITE_OTHER): Payer: Commercial Managed Care - PPO | Admitting: Nurse Practitioner

## 2018-10-25 ENCOUNTER — Encounter: Payer: Self-pay | Admitting: Nurse Practitioner

## 2018-10-25 ENCOUNTER — Telehealth: Payer: Self-pay | Admitting: *Deleted

## 2018-10-25 VITALS — BP 130/80 | HR 67 | Temp 99.1°F | Ht 68.0 in | Wt 193.0 lb

## 2018-10-25 DIAGNOSIS — K625 Hemorrhage of anus and rectum: Secondary | ICD-10-CM | POA: Diagnosis not present

## 2018-10-25 DIAGNOSIS — R1011 Right upper quadrant pain: Secondary | ICD-10-CM | POA: Diagnosis not present

## 2018-10-25 DIAGNOSIS — Z1211 Encounter for screening for malignant neoplasm of colon: Secondary | ICD-10-CM

## 2018-10-25 DIAGNOSIS — Z7901 Long term (current) use of anticoagulants: Secondary | ICD-10-CM | POA: Diagnosis not present

## 2018-10-25 MED ORDER — SUPREP BOWEL PREP KIT 17.5-3.13-1.6 GM/177ML PO SOLN: 1.0000 | ORAL | 0 refills | Status: DC

## 2018-10-25 NOTE — Telephone Encounter (Signed)
Request for surgical clearance:     Endoscopy Procedure  What type of surgery is being performed?     Endoscopy./colonoscopy  When is this surgery scheduled?     11/29/2018  What type of clearance is required ?   Pharmacy  Are there any medications that need to be held prior to surgery and how long? Pradaxa, 5 days  Practice name and name of physician performing surgery?      [facility]  What is your office phone and fax number?      Phone- [phone]  [phone]  Anesthesia type (None, local, MAC, general) ?       MAC

## 2018-10-25 NOTE — Patient Instructions (Signed)
You have been scheduled for an endoscopy and colonoscopy. Please follow the written instructions given to you at your visit today. Please pick up your prep supplies at the pharmacy within the next 1-3 days. If you use inhalers (even only as needed), please bring them with you on the day of your procedure. Your physician has requested that you go to www.startemmi.com and enter the access code given to you at your visit today. This web site gives a general overview about your procedure. However, you should still follow specific instructions given to you by our office regarding your preparation for the procedure.  Continue your daily metamucil.  Continue Recticare as needed.  If you are age 14 or older, your body mass index should be between 23-30. Your Body mass index is 29.35 kg/m. If this is out of the aforementioned range listed, please consider follow up with your Primary Care Provider.  If you are age 55 or younger, your body mass index should be between 19-25. Your Body mass index is 29.35 kg/m. If this is out of the aformentioned range listed, please consider follow up with your Primary Care Provider.

## 2018-10-25 NOTE — Progress Notes (Addendum)
ASSESSMENT / PLAN:   59.  63 year old female who gives a history of colon polyps in [place] (last colonoscopy approximately 7 years ago she believes).  She is here for evaluation of rectal bleeding which is most likely hemorrhoidal in nature -The risks and benefits of colonoscopy with possible polypectomy / biopsies were discussed and the patient agrees to proceed.   2.  Atrial fibrillation/CAD with DES x2 in 2014.  On Pradaxa.  No recent chest pains or shortness of breath.  --Hold Pradaxa for 2 days before procedure - will instruct when and how to resume after procedure. Patient understands that there is a low but real risk of cardiovascular event such as heart attack, stroke, or embolism /  thrombosis, or ischemia while off Pradaxa. The patient consents to proceed. Will communicate by phone or EMR with patient's prescribing provider to confirm that holding Pradaxa is reasonable in this case.   3.  Longstanding history of hemorrhoids per patient.  They bleed and cause discomfort periodically. --Will evaluate at time of colonoscopy but recommend RectiCare as needed in the interim for perianal discomfort -She expressed interest in internal hemorrhoid banding at some point.  Brochure given.  Procedure briefly discussed.  4.  Intermittent RUQ pain over the last 2 years.  Normal-appearing gallbladder on ultrasound and CT scan in 2018.  She gives a remote history of a duodenal ulcer, she has some concern about recurrence.  -- I suspect EGD will be low yield and I expressed this to her.  She is still concerned and would like to proceed with EGD. The risks and benefits of EGD were discussed and the patient agrees to proceed.   5. Hx of infectious versus inflammatory pancolitis found on CT scan October 2018.  Suspect this was infectious.     HPI:    Chief Complaint: Rectal bleeding and perianal burning.  Brittany Buckley is a 63 year old female with history of HTN, DM2,  hyperlipidemia, PAF, CAD prior DES 2014, on Pradaxa.  She had a heart cath in May of this year.  Stents were patent, LAD large and normal.  LV function normal, EF 55%. 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    Brittany Buckley gives a longstanding history of hemorrhoidal bleeding.  She also gives a history of colon polyps.  Last colonoscopy approximately 7 years ago in [place].  Patient believes she had polyps removed, cannot remember when advised to have recall colonoscopy.  Her bowel movements vary in consistency from being loose to normal to hard.  She gets fecal leakage sometimes.  Leakage is scant amount when it occurs.  She uses Preparation H couple times a week mainly to ease perianal discomfort so she can sleep.  She has thought about more definitive treatment for hemorrhoids   In addition to above patient gives a history of intermittent right upper quadrant pain over the last couple years.  Pain is not related to eating, it is most noticeable when she is laying down.  Normal-appearing gallbladder on RUQ ultrasound May 2018.  Following that she had a CTAP with contrast October 2018 for evaluation of abdominal pain.  Gallbladder appeared normal on that as well.  She gives a remote history of a duodenal ulcer.  She does not use NSAIDs.  No nausea or vomiting.  Data Reviewed:   CBC 07/05/2018 was normal with hemoglobin of  13.7.    Past Medical History:  Diagnosis Date  . A-fib ([place])   . ADD (attention deficit disorder)   . Anginal pain ([place])   . Anxiety   . Arthritis    "hands, feet" (06/14/2016)  . Asthma   . Chronic lower back pain   . Chronic neck pain   . Coronary artery disease   . Diabetes ([place])   . GERD (gastroesophageal reflux disease)   . Headache    "monthly" (06/14/2016)  . Heart murmur   . Hyperlipidemia   . Hypertension   . MI (myocardial infarction) ([place]) 2014; 2015  . Migraine    "probably  twice/year" (06/14/2016)  . Numbness of toes   . Obesity   . On anticoagulant therapy   . Peripheral neuropathy   . Pneumonia 1990s  . Thyroid nodule   . Type II diabetes mellitus ([place])   . Urticaria      Past Surgical History:  Procedure Laterality Date  . ABDOMINAL HYSTERECTOMY    . BIOPSY THYROID    . CESAREAN SECTION    . CORONARY ANGIOPLASTY WITH STENT PLACEMENT  2014 & 2015  . LEFT HEART CATH AND CORONARY ANGIOGRAPHY N/A 07/09/2018   Procedure: LEFT HEART CATH AND CORONARY ANGIOGRAPHY;  Surgeon: Belva Crome, MD;  Location: [facility];  Service: Cardiovascular;  Laterality: N/A;  . TONSILLECTOMY AND ADENOIDECTOMY    . TRIGGER FINGER RELEASE Right    "pointer"   Family History  Problem Relation Age of Onset  . Hyperlipidemia Mother   . CAD Father   . Heart attack Father   . Cancer - Ovarian Sister   . Diabetes Maternal Grandmother        blindness  . Heart disease Paternal Grandmother   . Diabetes Paternal Grandfather        with amputation  . Healthy Son   . Obesity Daughter    Social History   Tobacco Use  . Smoking status: Never Smoker  . Smokeless tobacco: Never Used  Substance Use Topics  . Alcohol use: Yes    Alcohol/week: 1.0 standard drinks    Types: 1 Glasses of wine per week  . Drug use: No   Current Outpatient Medications  Medication Sig Dispense Refill  . albuterol (PROVENTIL HFA;VENTOLIN HFA) 108 (90 Base) MCG/ACT inhaler Inhale 1-2 puffs into the lungs every 6 (six) hours as needed for wheezing or shortness of breath.    Marland Kitchen amLODipine (NORVASC) 5 MG tablet Take 1 tablet (5 mg total) by mouth daily. 90 tablet 3  . atorvastatin (LIPITOR) 80 MG tablet Take 80 mg by mouth daily.    . carvedilol (COREG) 6.25 MG tablet Take 1 tablet (6.25 mg total) by mouth 2 (two) times daily. 60 tablet 3  . Cyanocobalamin (VITAMIN B-12 PO) Take 1 tablet by mouth daily.    . dabigatran (PRADAXA) 150 MG CAPS capsule Take 150 mg by mouth 2 (two) times daily.     . diphenhydrAMINE (BENADRYL) 25 MG tablet Take 25 mg by mouth every 6 (six) hours as needed.    . diphenhydramine-acetaminophen (TYLENOL PM) 25-500 MG TABS tablet Take 1 tablet by mouth at bedtime as needed.    . dronedarone (MULTAQ) 400 MG tablet Take 1 tablet (400 mg total) by mouth 2 (two) times daily with a meal. 60 tablet 6  . DULoxetine (CYMBALTA) 60 MG capsule Take 60 mg by mouth at bedtime.    Marland Kitchen ezetimibe (ZETIA) 10 MG tablet Take 10  mg by mouth daily.    . hydrocortisone cream 1 % Apply topically as needed for itching. 30 g 0  . JARDIANCE 25 MG TABS tablet Take 25 mg by mouth daily.    Marland Kitchen lisinopril (PRINIVIL,ZESTRIL) 40 MG tablet Take 40 mg by mouth daily.    . metFORMIN (GLUCOPHAGE) 1000 MG tablet Take 1,000 mg by mouth 2 (two) times daily with a meal.    . Multiple Vitamins-Minerals (MULTIVITAMIN GUMMIES ADULTS) CHEW Chew 1 tablet by mouth daily.    . nitroGLYCERIN (NITROSTAT) 0.4 MG SL tablet Place 0.4 mg under the tongue every 5 (five) minutes as needed for chest pain.     No current facility-administered medications for this visit.    Allergies  Allergen Reactions  . Codeine Hives    ONLY IN COUGH SYRUP  . Onion Other (See Comments)    Runny nose and chest/nasal congestion (IF RAW)  . Tomato Other (See Comments)    Runny nose and chest/nasal congestion (IF RAW)  . Chocolate Rash  . Demerol [Meperidine] Rash  . Morphine And Related Rash     Review of Systems: Positive for anxiety, arthritis, depression, heart rhythm changes, itching and muscle pain/cramps.  All other systems reviewed and negative except where noted in HPI.    Physical Exam:    Wt Readings from Last 3 Encounters:  07/09/18 202 lb (91.6 kg)  07/05/18 202 lb 9.6 oz (91.9 kg)  07/02/18 199 lb (90.3 kg)    There were no vitals taken for this visit. Constitutional:  Pleasant female in no acute distress. Psychiatric: Normal mood and affect. Behavior is normal. EENT: Pupils normal.  Conjunctivae are  normal. No scleral icterus. Neck supple.  Cardiovascular: Normal rate, regular rhythm. No edema Pulmonary/chest: Effort normal and breath sounds normal. No wheezing, rales or rhonchi. Abdominal: Soft, nondistended, nontender. Bowel sounds active throughout. There are no masses palpable. No hepatomegaly. Neurological: Alert and oriented to person place and time. Skin: Skin is warm and dry. No rashes noted.  Tye Savoy, NP  10/25/2018, 3:52 PM  Cc: Marton Redwood, MD

## 2018-10-26 NOTE — Telephone Encounter (Signed)
Clinical pharmacist to review paradaxa

## 2018-10-26 NOTE — Telephone Encounter (Signed)
Patient with diagnosis of ATRIAL FIBRILLATION on PRADAXA for anticoagulation.    Procedure: ENDOSCOPY/COLONOSCOPY Date of procedure: 11/29/2018  CHADS2-VASc score of  5 (HTN, AGE, DM2, CAD, female)  CrCl >100ML/MIN Platelet count 205  Per office protocol, patient can hold PRADAXA for 2 days prior to procedure.

## 2018-10-28 NOTE — Telephone Encounter (Signed)
preop call back pool to inform patient regarding recommendation by our clinical pharmacist, I will forward this note to the requesting provider

## 2018-10-29 NOTE — Telephone Encounter (Signed)
Left message for patient to call back  

## 2018-10-29 NOTE — Progress Notes (Signed)
Attending Physician's Attestation   I have reviewed the chart.   I agree with the Advanced Practitioner's note, impression, and recommendations with any updates as below.  Reasonable to pursue an upper and lower endoscopy as outlined by PA Renaee Munda.  We will hold Pradaxa for 2 days prior to procedure and initiate back after procedures based on what is done most likely 2 to 3 days after.  We will communicate to get the okay from the patient's prescribing provider for anticoagulation hold.  Justice Britain, MD [facility] # [phone]

## 2018-10-29 NOTE — Telephone Encounter (Signed)
I left a message for the patient to return my call about surgical clearance.     

## 2018-10-30 ENCOUNTER — Encounter: Payer: Self-pay | Admitting: Neurology

## 2018-10-30 ENCOUNTER — Other Ambulatory Visit: Payer: Self-pay

## 2018-10-30 ENCOUNTER — Ambulatory Visit (INDEPENDENT_AMBULATORY_CARE_PROVIDER_SITE_OTHER): Payer: Commercial Managed Care - PPO | Admitting: Neurology

## 2018-10-30 VITALS — BP 171/83 | HR 58 | Wt 195.0 lb

## 2018-10-30 DIAGNOSIS — I480 Paroxysmal atrial fibrillation: Secondary | ICD-10-CM

## 2018-10-30 DIAGNOSIS — E663 Overweight: Secondary | ICD-10-CM | POA: Diagnosis not present

## 2018-10-30 DIAGNOSIS — J4520 Mild intermittent asthma, uncomplicated: Secondary | ICD-10-CM

## 2018-10-30 DIAGNOSIS — R0683 Snoring: Secondary | ICD-10-CM

## 2018-10-30 DIAGNOSIS — G4719 Other hypersomnia: Secondary | ICD-10-CM | POA: Diagnosis not present

## 2018-10-30 DIAGNOSIS — R351 Nocturia: Secondary | ICD-10-CM

## 2018-10-30 DIAGNOSIS — R519 Headache, unspecified: Secondary | ICD-10-CM

## 2018-10-30 DIAGNOSIS — Z8679 Personal history of other diseases of the circulatory system: Secondary | ICD-10-CM | POA: Diagnosis not present

## 2018-10-30 DIAGNOSIS — R51 Headache: Secondary | ICD-10-CM

## 2018-10-30 DIAGNOSIS — Z955 Presence of coronary angioplasty implant and graft: Secondary | ICD-10-CM

## 2018-10-30 NOTE — Telephone Encounter (Signed)
I have also spoken to the patient to advise her that we have been given clearance from cardiology for her to hold Pradaxa 2 days prior to procedure. She verbalizes understanding.  Dr Rush Landmark- [facility] [place] that cardiology has given a 2 day clearance for pradaxa rather than our typical 5 day clearance we request.

## 2018-10-30 NOTE — Progress Notes (Signed)
Subjective:    Patient ID: Brittany Buckley is a 63 y.o. female.  HPI     Star Age, MD, PhD [facility] [address], [address] [place], [place] [address]  Dear Dr. Brigitte Pulse,   I saw your patient, Brittany Buckley, upon your kind request in my neurologic clinic today for initial consultation of her sleep disorder, in particular, concern for underlying obstructive sleep apnea.  She was referred for sleep evaluation in 2018 but did not have an appointment at the time. The patient is unaccompanied today. As you know, Brittany Buckley is a 63 year old right-handed woman with an underlying complex medical history of arthritis, anxiety, asthma, reflux disease, chronic kidney disease, chronic back pain, coronary artery disease with status post MI and PTCA, paroxysmal A. fib, ADD, diabetes, neuropathy, thyroid nodules, and overweight state, who reports snoring and excessive daytime somnolence.  She had a recent heart cath due to unstable angina.  She follows with Dr. Rushie Goltz in cardiology.  She snores, she has woken up with a headache occasionally, she has nocturia about once per average night.  She has had some nocturnal reflux symptoms as well.  She is a Patent examiner, teaches at [employer].  She moved from [place] some 4 years ago.  Prior to that she was in [place] for over 20 years.  She is married and lives with her husband.  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  She has 2 grown children, daughter lives in [place] and her son lives in [place]. I reviewed your telemedicine note from 10/04/2018, which you kindly included. Her Epworth sleepiness score is 16 out of 24, fatigue severity score is 60 out of 63.  She is a non-smoker and does not drink alcohol, stopped in July 2020 as she felt it made her A. fib come back.  She drinks caffeine in the form of coffee, 2 to 3 cups/day on average.  She is on a blood thinner,  Pradaxa, is status post cardioversion some years ago.  She teaches seventh and eighth grade science.  She has a history of asthma with as needed inhaler.  She noticed a flareup in her allergy symptoms when she was in [place].  She has a bedtime between 10 and 1030, she is currently doing in class teaching.  Rise time is around 6.  She had a tonsillectomy as a 63 year old.  Her Past Medical History Is Significant For: Past Medical History:  Diagnosis Date  . A-fib ([place])   . ADD (attention deficit disorder)   . Anginal pain ([place])   . Anxiety   . Arthritis    "hands, feet" (06/14/2016)  . Asthma   . Chronic lower back pain   . Chronic neck pain   . Coronary artery disease   . Diabetes ([place])   . GERD (gastroesophageal reflux disease)   . Headache    "monthly" (06/14/2016)  . Heart murmur   . Hyperlipidemia   . Hypertension   . MI (myocardial infarction) ([place]) 2014; 2015  . Migraine    "probably twice/year" (06/14/2016)  . Numbness of toes   . Obesity   . On anticoagulant therapy   . Peripheral neuropathy   . Pneumonia 1990s  . Thyroid nodule   . Type II diabetes mellitus ([place])   . Urticaria     Her Past Surgical History Is Significant For: Past Surgical History:  Procedure Laterality Date  . ABDOMINAL HYSTERECTOMY    .  BIOPSY THYROID    . CESAREAN SECTION    . CORONARY ANGIOPLASTY WITH STENT PLACEMENT  2014 & 2015  . LEFT HEART CATH AND CORONARY ANGIOGRAPHY N/A 07/09/2018   Procedure: LEFT HEART CATH AND CORONARY ANGIOGRAPHY;  Surgeon: Belva Crome, MD;  Location: [facility];  Service: Cardiovascular;  Laterality: N/A;  . TONSILLECTOMY AND ADENOIDECTOMY    . TRIGGER FINGER RELEASE Right    "pointer"    Her Family History Is Significant For: Family History  Problem Relation Age of Onset  . Hyperlipidemia Mother   . CAD Father   . Heart attack Father   . Cancer - Ovarian Sister   . Diabetes Maternal Grandmother        blindness  . Heart disease Paternal  Grandmother   . Diabetes Paternal Grandfather        with amputation  . Healthy Son   . Obesity Daughter     Her Social History Is Significant For: Social History   Socioeconomic History  . Marital status: Married    Spouse name: Not on file  . Number of children: Not on file  . Years of education: Not on file  . Highest education level: Not on file  Occupational History  . Not on file  Social Needs  . Financial resource strain: Not on file  . Food insecurity    Worry: Not on file    Inability: Not on file  . Transportation needs    Medical: Not on file    Non-medical: Not on file  Tobacco Use  . Smoking status: Never Smoker  . Smokeless tobacco: Never Used  Substance and Sexual Activity  . Alcohol use: Yes    Alcohol/week: 1.0 standard drinks    Types: 1 Glasses of wine per week  . Drug use: No  . Sexual activity: Yes    Comment: married  Lifestyle  . Physical activity    Days per week: Not on file    Minutes per session: Not on file  . Stress: Not on file  Relationships  . Social Herbalist on phone: Not on file    Gets together: Not on file    Attends religious service: Not on file    Active member of club or organization: Not on file    Attends meetings of clubs or organizations: Not on file    Relationship status: Not on file  Other Topics Concern  . Not on file  Social History Narrative  . Not on file    Her Allergies Are:  Allergies  Allergen Reactions  . Codeine Hives    ONLY IN COUGH SYRUP  . Onion Other (See Comments)    Runny nose and chest/nasal congestion (IF RAW)  . Tomato Other (See Comments)    Runny nose and chest/nasal congestion (IF RAW)  . Chocolate Rash  . Demerol [Meperidine] Rash  . Morphine And Related Rash  :   Her Current Medications Are:  Outpatient Encounter Medications as of 10/30/2018  Medication Sig  . albuterol (PROVENTIL HFA;VENTOLIN HFA) 108 (90 Base) MCG/ACT inhaler Inhale 1-2 puffs into the lungs every  6 (six) hours as needed for wheezing or shortness of breath.  Marland Kitchen atorvastatin (LIPITOR) 80 MG tablet Take 80 mg by mouth daily.  . carvedilol (COREG) 6.25 MG tablet Take 1 tablet (6.25 mg total) by mouth 2 (two) times daily.  . Cyanocobalamin (VITAMIN B-12 PO) Take 1 tablet by mouth daily.  . dabigatran (  PRADAXA) 150 MG CAPS capsule Take 150 mg by mouth 2 (two) times daily.  . diphenhydrAMINE (BENADRYL) 25 MG tablet Take 25 mg by mouth every 6 (six) hours as needed.  . diphenhydramine-acetaminophen (TYLENOL PM) 25-500 MG TABS tablet Take 1 tablet by mouth at bedtime as needed.  . dronedarone (MULTAQ) 400 MG tablet Take 1 tablet (400 mg total) by mouth 2 (two) times daily with a meal.  . DULoxetine (CYMBALTA) 60 MG capsule Take 60 mg by mouth at bedtime.  . hydrocortisone cream 1 % Apply topically as needed for itching.  Marland Kitchen JARDIANCE 25 MG TABS tablet Take 25 mg by mouth daily.  Marland Kitchen lisinopril (PRINIVIL,ZESTRIL) 40 MG tablet Take 40 mg by mouth daily.  . metFORMIN (GLUCOPHAGE) 1000 MG tablet Take 1,000 mg by mouth 2 (two) times daily with a meal.  . Multiple Vitamins-Minerals (MULTIVITAMIN GUMMIES ADULTS) CHEW Chew 1 tablet by mouth daily.  . nitroGLYCERIN (NITROSTAT) 0.4 MG SL tablet Place 0.4 mg under the tongue every 5 (five) minutes as needed for chest pain.  Marland Kitchen SUPREP BOWEL PREP KIT 17.5-3.13-1.6 GM/177ML SOLN Take 1 kit by mouth as directed. For colonoscopy prep  . amLODipine (NORVASC) 5 MG tablet Take 1 tablet (5 mg total) by mouth daily.  . [DISCONTINUED] ezetimibe (ZETIA) 10 MG tablet Take 10 mg by mouth daily.   No facility-administered encounter medications on file as of 10/30/2018.   :  Review of Systems:  Out of a complete 14 point review of systems, all are reviewed and negative with the exception of these symptoms as listed below: Review of Systems  Neurological:       Pt presents today to discuss her sleep. Pt has never had a sleep study but does endorse snoring.  Epworth  Sleepiness Scale 0= would never doze 1= slight chance of dozing 2= moderate chance of dozing 3= high chance of dozing  Sitting and reading: 2 Watching TV: 3 Sitting inactive in a public place (ex. Theater or meeting): 2 As a passenger in a car for an hour without a break: 3 Lying down to rest in the afternoon: 3 Sitting and talking to someone: 0 Sitting quietly after lunch (no alcohol): 3 In a car, while stopped in traffic: 0 Total: 16     Objective:  Neurological Exam  Physical Exam Physical Examination:   Vitals:   10/30/18 1606  BP: (!) 171/83  Pulse: (!) 58   General Examination: The patient is a very pleasant 63 y.o. female in no acute distress. She appears well-developed and well-nourished and well groomed.   HEENT: Normocephalic, atraumatic, pupils are equal, round and reactive to light and accommodation. Extraocular tracking is good without limitation to gaze excursion or nystagmus noted. Normal smooth pursuit is noted. Hearing is grossly intact. Face is symmetric with normal facial animation and normal facial sensation. Speech is clear with no dysarthria noted. There is no hypophonia. There is no lip, neck/head, jaw or voice tremor. Neck is supple with full range of passive and active motion. There are no carotid bruits on auscultation. Oropharynx exam reveals: mild mouth dryness, adequate dental hygiene and mild airway crowding, due to Small airway entry and redundant soft palate, tonsils are absent.  Mallampati is class III, neck circumference is 14-1/2 inches.  Tongue protrudes centrally and palate elevates symmetrically.   Chest: Clear to auscultation without wheezing, rhonchi or crackles noted.  Heart: S1+S2+0, regular 2/6 systolic heart murmur.   Abdomen: Soft, non-tender and non-distended with normal bowel sounds appreciated  on auscultation.  Extremities: There is no pitting edema in the distal lower extremities bilaterally. Pedal pulses are intact.  Skin:  Warm and dry without trophic changes noted. There are no varicose veins.  Musculoskeletal: exam reveals no obvious joint deformities, tenderness or joint swelling or erythema.   Neurologically:  Mental status: The patient is awake, alert and oriented in all 4 spheres. His immediate and remote memory, attention, language skills and fund of knowledge are appropriate. There is no evidence of aphasia, agnosia, apraxia or anomia. Speech is clear with normal prosody and enunciation. Thought process is linear. Mood is normal and affect is normal.  Cranial nerves II - XII are as described above under HEENT exam. In addition: shoulder shrug is normal with equal shoulder height noted. Motor exam: Normal bulk, strength and tone is noted. There is no tremor. Reflexes are 1+. Fine motor skills and coordination: grossly intact.  Cerebellar testing: No dysmetria or intention tremor on finger to nose testing. Heel to shin is unremarkable bilaterally. There is no truncal or gait ataxia.  Sensory exam: intact to light touch.  Gait, station and balance: She stands easily. No veering to one side is noted. No leaning to one side is noted. Posture is age-appropriate and stance is narrow based. Gait shows normal stride length and normal pace. No problems turning are noted.   Assessment and Plan:  In summary, Lorelle Macaluso is a very pleasant 63 y.o.-year old female with an underlying complex medical history of arthritis, anxiety, asthma, reflux disease, chronic kidney disease, chronic back pain, coronary artery disease with status post MI and PTCA, paroxysmal A. fib, ADD, diabetes, neuropathy, thyroid nodules, and overweight state, whose history and physical exam are concerning for obstructive sleep apnea (OSA). I had a long chat with the patient about my findings and the diagnosis of OSA, its prognosis and treatment options. We talked about medical treatments, surgical interventions and non-pharmacological approaches. I  explained in particular the risks and ramifications of untreated moderate to severe OSA, especially with respect to developing cardiovascular disease down the Road, including congestive heart failure, difficult to treat hypertension, cardiac arrhythmias, or stroke. Even type 2 diabetes has, in part, been linked to untreated OSA. Symptoms of untreated OSA include daytime sleepiness, memory problems, mood irritability and mood disorder such as depression and anxiety, lack of energy, as well as recurrent headaches, especially morning headaches. We talked about trying to maintain a healthy lifestyle in general, as well as the importance of weight control. I encouraged the patient to eat healthy, exercise daily and keep well hydrated, to keep a scheduled bedtime and wake time routine, to not skip any meals and eat healthy snacks in between meals. I advised the patient not to drive when feeling sleepy. I recommended the following at this time: sleep study.   I explained the sleep test procedure to the patient and also outlined possible surgical and non-surgical treatment options of OSA, including the use of a custom-made dental device (which would require a referral to a specialist dentist or oral surgeon), upper airway surgical options, such as pillar implants, radiofrequency surgery, tongue base surgery, and UPPP (which would involve a referral to an ENT surgeon). Rarely, jaw surgery such as mandibular advancement may be considered.  I also explained the CPAP treatment option to the patient, who indicated that she would be willing to try CPAP if the need arises. I explained the importance of being compliant with PAP treatment, not only for insurance purposes but primarily to  improve Her symptoms, and for the patient's long term health benefit, including to reduce Her cardiovascular risks. I answered all her questions today and the patient was in agreement. I plan to see her back after the sleep study is completed  and encouraged her to call with any interim questions, concerns, problems or updates.   Thank you very much for allowing me to participate in the care of this nice patient. If I can be of any further assistance to you please do not hesitate to call me at [phone].  Sincerely,   Star Age, MD, PhD

## 2018-10-30 NOTE — Telephone Encounter (Signed)
Pt has been advised today ok to hold her Pradaxa x 2 days prior to procedure. Pt thanked me for the call. Pt thanked me for the call. I will remove from Pre Op Call Back Pool.

## 2018-10-30 NOTE — Patient Instructions (Signed)

## 2018-10-30 NOTE — Telephone Encounter (Signed)
Thank you for update. Most literature suggests that Pradaxa can be held for 2-days prior to procedure. However, if the patient has significant renal impairment or is on dialysis then may want to consider holding for longer (up to 4-5 days). Her Cr is normal. OK to hold for 2-days and then I'll restart based on what we find and do on her procedure.  GM

## 2018-11-15 ENCOUNTER — Other Ambulatory Visit: Payer: Self-pay

## 2018-11-15 ENCOUNTER — Ambulatory Visit
Admission: RE | Admit: 2018-11-15 | Discharge: 2018-11-15 | Disposition: A | Discharge status: Home / Self Care | Payer: Commercial Managed Care - PPO | Source: Ambulatory Visit | Attending: Internal Medicine | Admitting: Internal Medicine

## 2018-11-15 DIAGNOSIS — Z1231 Encounter for screening mammogram for malignant neoplasm of breast: Secondary | ICD-10-CM

## 2018-11-19 ENCOUNTER — Other Ambulatory Visit: Payer: Self-pay | Admitting: Internal Medicine

## 2018-11-19 ENCOUNTER — Telehealth: Payer: Self-pay

## 2018-11-19 DIAGNOSIS — R928 Other abnormal and inconclusive findings on diagnostic imaging of breast: Secondary | ICD-10-CM

## 2018-11-19 NOTE — Telephone Encounter (Signed)
We have attempted to call the patient two times to schedule sleep study.  Patient has been unavailable at the phone numbers we have on file and has not returned our calls. If patient calls back we will schedule them for their sleep study.  

## 2018-11-26 ENCOUNTER — Ambulatory Visit (INDEPENDENT_AMBULATORY_CARE_PROVIDER_SITE_OTHER): Payer: Commercial Managed Care - PPO | Admitting: Podiatry

## 2018-11-26 ENCOUNTER — Other Ambulatory Visit: Payer: Self-pay

## 2018-11-26 ENCOUNTER — Ambulatory Visit (INDEPENDENT_AMBULATORY_CARE_PROVIDER_SITE_OTHER): Payer: Commercial Managed Care - PPO

## 2018-11-26 ENCOUNTER — Encounter: Payer: Self-pay | Admitting: Podiatry

## 2018-11-26 DIAGNOSIS — M2041 Other hammer toe(s) (acquired), right foot: Secondary | ICD-10-CM

## 2018-11-26 DIAGNOSIS — M2042 Other hammer toe(s) (acquired), left foot: Secondary | ICD-10-CM | POA: Diagnosis not present

## 2018-11-26 DIAGNOSIS — M216X9 Other acquired deformities of unspecified foot: Secondary | ICD-10-CM | POA: Diagnosis not present

## 2018-11-26 DIAGNOSIS — M7989 Other specified soft tissue disorders: Secondary | ICD-10-CM | POA: Diagnosis not present

## 2018-11-26 MED ORDER — MUPIROCIN 2 % EX OINT: 1.0000 "application " | TOPICAL_OINTMENT | Freq: Two times a day (BID) | CUTANEOUS | 2 refills | Status: DC

## 2018-11-28 NOTE — Progress Notes (Signed)
Subjective:   Patient ID: Brittany Buckley, female   DOB: 63 y.o.   MRN: [id number]   HPI 63 year old female presents the office today for concerns of possible cyst to the lateral aspect of her right foot.  This is been ongoing for quite some time.  She states when she wear shoes they cut into the area causing wounds as well as the cyst.  She also started to get hammertoes as well he states that she has a high arch.  She starts to get some irritation of the top of her toes with hammertoes.   Review of Systems  All other systems reviewed and are negative.  Past Medical History:  Diagnosis Date  . A-fib ([place])   . ADD (attention deficit disorder)   . Anginal pain ([place])   . Anxiety   . Arthritis    "hands, feet" (06/14/2016)  . Asthma   . Chronic lower back pain   . Chronic neck pain   . Coronary artery disease   . Diabetes ([place])   . GERD (gastroesophageal reflux disease)   . Headache    "monthly" (06/14/2016)  . Heart murmur   . Hyperlipidemia   . Hypertension   . MI (myocardial infarction) ([place]) 2014; 2015  . Migraine    "probably twice/year" (06/14/2016)  . Numbness of toes   . Obesity   . On anticoagulant therapy   . Peripheral neuropathy   . Pneumonia 1990s  . Thyroid nodule   . Type II diabetes mellitus ([place])   . Urticaria     Past Surgical History:  Procedure Laterality Date  . ABDOMINAL HYSTERECTOMY    . BIOPSY THYROID    . BREAST EXCISIONAL BIOPSY Right   . CESAREAN SECTION    . CORONARY ANGIOPLASTY WITH STENT PLACEMENT  2014 & 2015  . LEFT HEART CATH AND CORONARY ANGIOGRAPHY N/A 07/09/2018   Procedure: LEFT HEART CATH AND CORONARY ANGIOGRAPHY;  Surgeon: Belva Crome, MD;  Location: [facility];  Service: Cardiovascular;  Laterality: N/A;  . TONSILLECTOMY AND ADENOIDECTOMY    . TRIGGER FINGER RELEASE Right    "pointer"     Current Outpatient Medications:  .  albuterol (PROVENTIL HFA;VENTOLIN HFA) 108 (90 Base) MCG/ACT inhaler, Inhale 1-2 puffs into  the lungs every 6 (six) hours as needed for wheezing or shortness of breath., Disp: , Rfl:  .  amLODipine (NORVASC) 5 MG tablet, Take 1 tablet (5 mg total) by mouth daily., Disp: 90 tablet, Rfl: 3 .  atorvastatin (LIPITOR) 80 MG tablet, Take 80 mg by mouth daily., Disp: , Rfl:  .  carvedilol (COREG) 6.25 MG tablet, Take 1 tablet (6.25 mg total) by mouth 2 (two) times daily., Disp: 60 tablet, Rfl: 3 .  Cyanocobalamin (VITAMIN B-12 PO), Take 1 tablet by mouth daily., Disp: , Rfl:  .  dabigatran (PRADAXA) 150 MG CAPS capsule, Take 150 mg by mouth 2 (two) times daily., Disp: , Rfl:  .  diphenhydrAMINE (BENADRYL) 25 MG tablet, Take 25 mg by mouth every 6 (six) hours as needed., Disp: , Rfl:  .  diphenhydramine-acetaminophen (TYLENOL PM) 25-500 MG TABS tablet, Take 1 tablet by mouth at bedtime as needed., Disp: , Rfl:  .  dronedarone (MULTAQ) 400 MG tablet, Take 1 tablet (400 mg total) by mouth 2 (two) times daily with a meal., Disp: 60 tablet, Rfl: 6 .  DULoxetine (CYMBALTA) 60 MG capsule, Take 60 mg by mouth at bedtime., Disp: , Rfl:  .  hydrocortisone cream 1 %,  Apply topically as needed for itching., Disp: 30 g, Rfl: 0 .  JARDIANCE 25 MG TABS tablet, Take 25 mg by mouth daily., Disp: , Rfl:  .  lisinopril (PRINIVIL,ZESTRIL) 40 MG tablet, Take 40 mg by mouth daily., Disp: , Rfl:  .  metFORMIN (GLUCOPHAGE) 1000 MG tablet, Take 1,000 mg by mouth 2 (two) times daily with a meal., Disp: , Rfl:  .  Multiple Vitamins-Minerals (MULTIVITAMIN GUMMIES ADULTS) CHEW, Chew 1 tablet by mouth daily., Disp: , Rfl:  .  mupirocin ointment (BACTROBAN) 2 %, Apply 1 application topically 2 (two) times daily., Disp: 30 g, Rfl: 2 .  nitroGLYCERIN (NITROSTAT) 0.4 MG SL tablet, Place 0.4 mg under the tongue every 5 (five) minutes as needed for chest pain., Disp: , Rfl:  .  OZEMPIC, 0.25 OR 0.5 MG/DOSE, 2 MG/1.5ML SOPN, INJ 0.25 MG UNDER THE SKIN WEEKLY FOR 2 WKS THEN 0.5 MG WEEKLY, Disp: , Rfl:  .  SUPREP BOWEL PREP KIT  17.5-3.13-1.6 GM/177ML SOLN, Take 1 kit by mouth as directed. For colonoscopy prep, Disp: 354 mL, Rfl: 0  Allergies  Allergen Reactions  . Codeine Hives    ONLY IN COUGH SYRUP  . Onion Other (See Comments)    Runny nose and chest/nasal congestion (IF RAW)  . Tomato Other (See Comments)    Runny nose and chest/nasal congestion (IF RAW)  . Chocolate Rash  . Demerol [Meperidine] Rash  . Morphine And Related Rash    Objective:  Physical Exam  General: AAO x3, NAD  Dermatological: Scab is present for the dorsal lateral aspect of the right foot this is near the fifth metatarsal base area.  Some amount of fluid around the area that she is concerned about a cyst.  There is no fluctuation crepitation.  No open lesions.  Vascular: Dorsalis Pedis artery and Posterior Tibial artery pedal pulses are 2/4 bilateral with immedate capillary fill time.There is no pain with calf compression, swelling, warmth, erythema.   Neruologic: Grossly intact via light touch bilateral. Vibratory intact via tuning fork bilateral. Protective threshold with Semmes Wienstein monofilament intact to all pedal sites bilateral.   Musculoskeletal: Cavus foot type is present varus hammertoes as well as hallux malleus present.  Some mild irritation the dorsal aspect the digits there is no skin breakdown.  Muscular strength 5/5 in all groups tested bilateral.  Gait: Unassisted, Nonantalgic.       Assessment:   Small ganglion cyst right foot, hammertoe deformity     Plan:  -Treatment options discussed including all alternatives, risks, and complications -Etiology of symptoms were discussed -X-rays were obtained and reviewed with the patient.  There is no evidence of acute fracture or stress fracture.  Hammertoes present. -Recommended and prescribed mupirocin ointment for the area of the scalp.  Also dispensed offloading pad.  Discussed shoe modifications and orthotics.  Discussed stretching exercises help with the  hammertoes.  Return in about 3 weeks (around 12/17/2018).  Trula Slade DPM

## 2018-11-29 ENCOUNTER — Encounter: Payer: Commercial Managed Care - PPO | Admitting: Gastroenterology

## 2018-11-29 ENCOUNTER — Encounter: Payer: Self-pay | Admitting: Gastroenterology

## 2018-11-30 ENCOUNTER — Other Ambulatory Visit: Payer: Commercial Managed Care - PPO

## 2018-12-06 ENCOUNTER — Telehealth: Payer: Self-pay

## 2018-12-06 NOTE — Telephone Encounter (Signed)
Covid-19 screening questions   Do you now or have you had a fever in the last 14 days?  Do you have any respiratory symptoms of shortness of breath or cough now or in the last 14 days?  Do you have any family members or close contacts with diagnosed or suspected Covid-19 in the past 14 days?  Have you been tested for Covid-19 and found to be positive?       

## 2018-12-06 NOTE — Telephone Encounter (Signed)
Pt responded "no" to all screening questions °

## 2018-12-07 ENCOUNTER — Encounter: Payer: Self-pay | Admitting: Gastroenterology

## 2018-12-07 ENCOUNTER — Ambulatory Visit
Admission: RE | Admit: 2018-12-07 | Discharge: 2018-12-07 | Disposition: A | Discharge status: Home / Self Care | Payer: Commercial Managed Care - PPO | Source: Ambulatory Visit | Attending: Internal Medicine | Admitting: Internal Medicine

## 2018-12-07 ENCOUNTER — Other Ambulatory Visit: Payer: Self-pay | Admitting: Internal Medicine

## 2018-12-07 ENCOUNTER — Ambulatory Visit (AMBULATORY_SURGERY_CENTER): Payer: Commercial Managed Care - PPO | Admitting: Gastroenterology

## 2018-12-07 ENCOUNTER — Other Ambulatory Visit: Payer: Self-pay

## 2018-12-07 VITALS — BP 183/81 | HR 61 | Temp 97.5°F | Resp 15

## 2018-12-07 DIAGNOSIS — K625 Hemorrhage of anus and rectum: Secondary | ICD-10-CM | POA: Diagnosis present

## 2018-12-07 DIAGNOSIS — K259 Gastric ulcer, unspecified as acute or chronic, without hemorrhage or perforation: Secondary | ICD-10-CM

## 2018-12-07 DIAGNOSIS — K208 Other esophagitis without bleeding: Secondary | ICD-10-CM

## 2018-12-07 DIAGNOSIS — K5730 Diverticulosis of large intestine without perforation or abscess without bleeding: Secondary | ICD-10-CM

## 2018-12-07 DIAGNOSIS — K621 Rectal polyp: Secondary | ICD-10-CM | POA: Diagnosis not present

## 2018-12-07 DIAGNOSIS — D128 Benign neoplasm of rectum: Secondary | ICD-10-CM

## 2018-12-07 DIAGNOSIS — K209 Esophagitis, unspecified without bleeding: Secondary | ICD-10-CM

## 2018-12-07 DIAGNOSIS — K449 Diaphragmatic hernia without obstruction or gangrene: Secondary | ICD-10-CM

## 2018-12-07 DIAGNOSIS — D179 Benign lipomatous neoplasm, unspecified: Secondary | ICD-10-CM | POA: Diagnosis not present

## 2018-12-07 DIAGNOSIS — K2950 Unspecified chronic gastritis without bleeding: Secondary | ICD-10-CM

## 2018-12-07 DIAGNOSIS — R928 Other abnormal and inconclusive findings on diagnostic imaging of breast: Secondary | ICD-10-CM

## 2018-12-07 DIAGNOSIS — K2970 Gastritis, unspecified, without bleeding: Secondary | ICD-10-CM

## 2018-12-07 DIAGNOSIS — K648 Other hemorrhoids: Secondary | ICD-10-CM

## 2018-12-07 DIAGNOSIS — K269 Duodenal ulcer, unspecified as acute or chronic, without hemorrhage or perforation: Secondary | ICD-10-CM

## 2018-12-07 DIAGNOSIS — D122 Benign neoplasm of ascending colon: Secondary | ICD-10-CM

## 2018-12-07 DIAGNOSIS — K635 Polyp of colon: Secondary | ICD-10-CM | POA: Diagnosis not present

## 2018-12-07 DIAGNOSIS — N6320 Unspecified lump in the left breast, unspecified quadrant: Secondary | ICD-10-CM

## 2018-12-07 DIAGNOSIS — D120 Benign neoplasm of cecum: Secondary | ICD-10-CM

## 2018-12-07 MED ORDER — OMEPRAZOLE 40 MG PO CPDR: 40.0000 mg | DELAYED_RELEASE_CAPSULE | Freq: Two times a day (BID) | ORAL | 2 refills | Status: DC

## 2018-12-07 NOTE — Op Note (Signed)
[facility] Patient Name: Brittany Buckley Procedure Date: 12/07/2018 4:13 PM MRN: [id number] Endoscopist: Justice Britain , MD Age: 63 Referring MD:  Date of Birth: 1955/07/16 Gender: Female Account #: [id number] Procedure:                Upper GI endoscopy Indications:              Abdominal pain in the right upper quadrant Medicines:                Monitored Anesthesia Care Procedure:                Pre-Anesthesia Assessment:                           - Prior to the procedure, a History and Physical                            was performed, and patient medications and                            allergies were reviewed. The patient's tolerance of                            previous anesthesia was also reviewed. The risks                            and benefits of the procedure and the sedation                            options and risks were discussed with the patient.                            All questions were answered, and informed consent                            was obtained. Prior Anticoagulants: The patient                            last took Pradaxa (dabigatran) 1 day prior to the                            procedure. ASA Grade Assessment: II - A patient                            with mild systemic disease. After reviewing the                            risks and benefits, the patient was deemed in                            satisfactory condition to undergo the procedure.                           After obtaining informed consent, the endoscope was  passed under direct vision. Throughout the                            procedure, the patient's blood pressure, pulse, and                            oxygen saturations were monitored continuously. The                            Endoscope was introduced through the mouth, and                            advanced to the second part of duodenum. The upper                            GI  endoscopy was accomplished without difficulty.                            The patient tolerated the procedure. 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                           LA Grade C (one or more mucosal breaks continuous                            between tops of 2 or more mucosal folds, less than                            75% circumference) esophagitis with no bleeding was                            found 25 to 27 cm from the incisors. This was                            biopsied with a cold forceps for histology.                           The Z-line was regular and was found 40 cm from the                            incisors.                           A small hiatal hernia was present.                           Patchy moderate inflammation characterized by  congestion (edema), erosions, erythema and                            granularity was found in the entire examined                            stomach.                           One non-bleeding superficial gastric ulcer with a                            clean ulcer base (Forrest Class III) was found in                            the gastric antrum. The lesion was 7 mm in largest                            dimension.                           No gross lesions were noted in the entire examined                            stomach. Biopsies were taken with a cold forceps                            for histology and Helicobacter pylori testing.                           Patchy mildly erythematous mucosa without active                            bleeding and with no stigmata of bleeding was found                            in the duodenal bulb, in the first portion of the                            duodenum and in the second portion of the duodenum.                           One non-bleeding  superficial duodenal ulcer with a                            clean ulcer base (Forrest Class III) was found in                            the duodenal bulb. The lesion was 4 mm in largest                            dimension. Complications:            No immediate complications. Estimated Blood Loss:  Estimated blood loss was minimal. Impression:               - Ectopic gastric mucosa in the proximal esophagus.                           - LA Grade C esophagitis. Biopsied.                           - Z-line regular, 40 cm from the incisors.                           - Small hiatal hernia.                           - Gastritis.                           - Non-bleeding gastric ulcer with a clean ulcer                            base (Forrest Class III).                           - No gross lesions in the stomach. Biopsied.                           - Erythematous duodenopathy.                           - Non-bleeding duodenal ulcer with a clean ulcer                            base (Forrest Class III). Recommendation:           - Proceed to scheduled colonoscopy.                           - Observe patient's clinical course.                           - Start Omeprazole 40 mg BID x 47-months.                           - Plan to repeat EGD In 27-months to follow up and                            ensure healing of mucosa and ulcers.                           - Observe patient's clinical course.                           - The findings and recommendations were discussed                            with the patient. Justice Britain, MD 12/07/2018 5:26:30 PM

## 2018-12-07 NOTE — Progress Notes (Signed)
Called to room to assist during endoscopic procedure.  Patient ID and intended procedure confirmed with present staff. Received instructions for my participation in the procedure from the performing physician.  

## 2018-12-07 NOTE — Progress Notes (Signed)
Temp by JB and vitals by [facility]

## 2018-12-07 NOTE — Progress Notes (Signed)
Report to PACU, RN, vss, BBS= Clear.  

## 2018-12-07 NOTE — Progress Notes (Signed)
Patient presented initially without any person with the assumption that she would be able to go home today and received sedation. Long discussion by our RN team and then by myself discussed that that was not possible. We offered her an unsedated colonoscopy and then subsequent attempt at possible unsedated endoscopy. She agreed to this initially. She was able to find a family member/friend who would be able to pick her up. We thus agreed to move forward with both procedures as planned with sedation. However we found out that she is only 24 hours removed from her Pradaxa being stopped (only missed 2 full doses). I discussed that she is at increased risk of bleeding from the procedures but would be willing to move forward with it and if we noticed that she had significant bleeding at time of procedure or if large polyps were found that would require potential need for difficulty with removal while on Pradaxa not being fully off of her system and we may have to repeat a colonoscopy at some point. Due to the patient's need and issues of rectal bleeding and desire to further evaluate that we will move forward with the increased risk colonoscopy/endoscopy.  The risks and benefits of endoscopic evaluation were discussed with the patient; these include but are not limited to the risk of perforation, infection, bleeding, missed lesions, lack of diagnosis, severe illness requiring hospitalization, as well as anesthesia and sedation related illnesses.  The patient is agreeable to proceed.    Justice Britain, MD [facility] Office # [phone]

## 2018-12-07 NOTE — Op Note (Signed)
[facility] Patient Name: Brittany Buckley Procedure Date: 12/07/2018 4:13 PM MRN: [id number] Endoscopist: Justice Britain , MD Age: 63 Referring MD:  Date of Birth: 03-19-1955 Gender: Female Account #: [id number] Procedure:                Colonoscopy Indications:              High risk colon cancer surveillance: Personal                            history of colonic polyps, Incidental - Rectal                            bleeding Medicines:                Monitored Anesthesia Care Procedure:                Pre-Anesthesia Assessment:                           - Prior to the procedure, a History and Physical                            was performed, and patient medications and                            allergies were reviewed. The patient's tolerance of                            previous anesthesia was also reviewed. The risks                            and benefits of the procedure and the sedation                            options and risks were discussed with the patient.                            All questions were answered, and informed consent                            was obtained. Prior Anticoagulants: The patient                            last took Pradaxa (dabigatran) 1 day prior to the                            procedure. ASA Grade Assessment: II - A patient                            with mild systemic disease. After reviewing the                            risks and benefits, the patient was deemed in  satisfactory condition to undergo the procedure.                           After obtaining informed consent, the colonoscope                            was passed under direct vision. Throughout the                            procedure, the patient's blood pressure, pulse, and                            oxygen saturations were monitored continuously. The                            Pediatric Colonoscope was introduced through the                             anus and advanced to the the cecum, identified by                            appendiceal orifice and ileocecal valve. The                            colonoscopy was unusually difficult due to                            restricted mobility of the colon and a tortuous                            colon. Successful completion of the procedure was                            aided by changing the patient's position, using                            manual pressure, withdrawing and reinserting the                            scope, straightening and shortening the scope to                            obtain bowel loop reduction and using scope                            torsion. The patient tolerated the procedure. The                            quality of the bowel preparation was adequate. The                            ileocecal valve, appendiceal orifice, and rectum  were photographed. Scope In: 4:42:51 PM Scope Out: 5:17:03 PM Scope Withdrawal Time: 0 hours 20 minutes 19 seconds  Total Procedure Duration: 0 hours 34 minutes 12 seconds  Findings:                 The digital rectal exam findings include                            hemorrhoids. Pertinent negatives include no                            palpable rectal lesions.                           A moderate amount of stool was found in the entire                            colon, interfering with visualization. Lavage of                            the area was performed using copious amounts,                            resulting in clearance with adequate visualization.                           Seven sessile polyps were found in the rectum (5),                            ascending colon (1) and cecum (1). The polyps were                            2 to 7 mm in size. These polyps were removed with a                            cold snare. Resection and retrieval were complete.                            There was a small lipoma, at the hepatic flexure.                           Many small and large-mouthed diverticula were found                            in the recto-sigmoid colon, sigmoid colon and                            descending colon.                           Non-bleeding non-thrombosed internal hemorrhoids                            were found during retroflexion, during perianal  exam and during digital exam. The hemorrhoids were                            Grade II (internal hemorrhoids that prolapse but                            reduce spontaneously). Complications:            No immediate complications. Estimated Blood Loss:     Estimated blood loss was minimal. Impression:               - Hemorrhoids found on digital rectal exam.                           - Stool in the entire examined colon.                           - Seven 2 to 7 mm polyps in the rectum, in the                            ascending colon and in the cecum, removed with a                            cold snare. Resected and retrieved.                           - Small lipoma at the hepatic flexure.                           - Diverticulosis in the recto-sigmoid colon, in the                            sigmoid colon and in the descending colon.                           - Non-bleeding non-thrombosed internal hemorrhoids. Recommendation:           - The patient will be observed post-procedure,                            until all discharge criteria are met.                           - Discharge patient to home.                           - Patient has a contact number available for                            emergencies. The signs and symptoms of potential                            delayed complications were discussed with the  patient. Return to normal activities tomorrow.                            Written discharge instructions were provided  to the                            patient.                           - High fiber diet.                           - Use FiberCon 1 tablet PO daily.                           - Restart Pradaxa in 72 hours to decrease risk of                            post-procedural bleeding.                           - Await pathology results.                           - Repeat colonoscopy for surveillance based on                            pathology results in 3/5/7 years.                           - If patient continues to have episodes of rectal                            bleeding and her bowel movements are optimized then                            would discuss case with colleagues to consider                            hemorrhoidal banding.                           - The findings and recommendations were discussed                            with the patient. Justice Britain, MD 12/07/2018 5:33:37 PM

## 2018-12-07 NOTE — Patient Instructions (Addendum)
RESTART Pradaxa in 72 hours to decrease risk of post-procedural bleeding.   START taking Omeprazole 40 mg Twice Daily for 3 months.    START taking FiberCon Once Daily (this is over the counter)   Handouts Provided:  Polyps  YOU HAD AN ENDOSCOPIC PROCEDURE TODAY AT [facility]:   Refer to the procedure report that was given to you for any specific questions about what was found during the examination.  If the procedure report does not answer your questions, please call your gastroenterologist to clarify.  If you requested that your care partner not be given the details of your procedure findings, then the procedure report has been included in a sealed envelope for you to review at your convenience later.  YOU SHOULD EXPECT: Some feelings of bloating in the abdomen. Passage of more gas than usual.  Walking can help get rid of the air that was put into your GI tract during the procedure and reduce the bloating. If you had a lower endoscopy (such as a colonoscopy or flexible sigmoidoscopy) you may notice spotting of blood in your stool or on the toilet paper. If you underwent a bowel prep for your procedure, you may not have a normal bowel movement for a few days.  Please Note:  You might notice some irritation and congestion in your nose or some drainage.  This is from the oxygen used during your procedure.  There is no need for concern and it should clear up in a day or so.  SYMPTOMS TO REPORT IMMEDIATELY:   Following lower endoscopy (colonoscopy or flexible sigmoidoscopy):  Excessive amounts of blood in the stool  Significant tenderness or worsening of abdominal pains  Swelling of the abdomen that is new, acute  Fever of 100F or higher   Following upper endoscopy (EGD)  Vomiting of blood or coffee ground material  New chest pain or pain under the shoulder blades  Painful or persistently difficult swallowing  New shortness of breath  Fever of 100F or higher  Black,  tarry-looking stools  For urgent or emergent issues, a gastroenterologist can be reached at any hour by calling [phone].   DIET:  We do recommend a small meal at first, but then you may proceed to your regular diet.  Drink plenty of fluids but you should avoid alcoholic beverages for 24 hours.  ACTIVITY:  You should plan to take it easy for the rest of today and you should NOT DRIVE or use heavy machinery until tomorrow (because of the sedation medicines used during the test).    FOLLOW UP: Our staff will call the number listed on your records 48-72 hours following your procedure to check on you and address any questions or concerns that you may have regarding the information given to you following your procedure. If we do not reach you, we will leave a message.  We will attempt to reach you two times.  During this call, we will ask if you have developed any symptoms of COVID 19. If you develop any symptoms (ie: fever, flu-like symptoms, shortness of breath, cough etc.) before then, please call [phone].  If you test positive for Covid 19 in the 2 weeks post procedure, please call and report this information to [place].    If any biopsies were taken you will be contacted by phone or by letter within the next 1-3 weeks.  Please call us at [phone] if you have not heard about the biopsies in 3 weeks.  SIGNATURES/CONFIDENTIALITY: You and/or your care partner have signed paperwork which will be entered into your electronic medical record.  These signatures attest to the fact that that the information above on your After Visit Summary has been reviewed and is understood.  Full responsibility of the confidentiality of this discharge information lies with you and/or your care-partner.

## 2018-12-07 NOTE — Progress Notes (Signed)
Pt arrived to [facility] and states that she doesn't have a carepartner.  She states that she doesn't know anyone in town and didn't have anyone to come with her.  She says that her husband is out of town.  She wants to have procedures without sedation and drive herself home.  Spoke with Dr. Rush Landmark.  He spoke with pt and gave her options.  It was decided that she would call a friend and she would drive her home post procedure.  While interviewing the pt she states that she took her Pradaxa yesterday morning.  The order from her cardiologist says that she should hold it for 2 days.  Dr. Rush Landmark in to speak to pt again.  Risks associated with proceeding were discussed and it was agreed that they would proceed with Endoscopy and Colonoscopy.

## 2018-12-11 ENCOUNTER — Other Ambulatory Visit: Payer: Commercial Managed Care - PPO

## 2018-12-11 ENCOUNTER — Telehealth: Payer: Self-pay | Admitting: *Deleted

## 2018-12-11 NOTE — Telephone Encounter (Signed)
1. Have you developed a fever since your procedure? no  2.   Have you had an respiratory symptoms (SOB or cough) since your procedure? no  3.   Have you tested positive for COVID 19 since your procedure no  4.   Have you had any family members/close contacts diagnosed with the COVID 19 since your procedure?  no   If yes to any of these questions please route to Joylene John, RN and Alphonsa Gin, Therapist, sports.     Follow up Call-  Call back number 12/07/2018  Post procedure Call Back phone  # [phone]  Permission to leave phone message Yes  Some recent data might be hidden     Patient questions:  Do you have a fever, pain , or abdominal swelling? No. Pain Score  0 *  Have you tolerated food without any problems? yes  Have you been able to return to your normal activities? Yes.    Do you have any questions about your discharge instructions: Diet   No. Medications  No. Follow up visit  No.  Do you have questions or concerns about your Care? No.  Actions: * If pain score is 4 or above: No action needed, pain <4.

## 2018-12-11 NOTE — Telephone Encounter (Signed)
Left message on f/u call 

## 2018-12-14 ENCOUNTER — Other Ambulatory Visit: Payer: Commercial Managed Care - PPO

## 2018-12-17 ENCOUNTER — Encounter: Payer: Self-pay | Admitting: Gastroenterology

## 2018-12-18 ENCOUNTER — Other Ambulatory Visit: Payer: Self-pay

## 2018-12-18 MED ORDER — OMEPRAZOLE 40 MG PO CPDR: 40.0000 mg | DELAYED_RELEASE_CAPSULE | Freq: Two times a day (BID) | ORAL | 2 refills | Status: DC

## 2018-12-20 ENCOUNTER — Ambulatory Visit: Payer: Commercial Managed Care - PPO | Admitting: Podiatry

## 2018-12-24 ENCOUNTER — Other Ambulatory Visit: Payer: Self-pay

## 2018-12-24 ENCOUNTER — Encounter: Payer: Self-pay | Admitting: Podiatry

## 2018-12-24 ENCOUNTER — Ambulatory Visit (INDEPENDENT_AMBULATORY_CARE_PROVIDER_SITE_OTHER): Payer: Commercial Managed Care - PPO | Admitting: Podiatry

## 2018-12-24 DIAGNOSIS — M779 Enthesopathy, unspecified: Secondary | ICD-10-CM | POA: Diagnosis not present

## 2018-12-24 DIAGNOSIS — M216X9 Other acquired deformities of unspecified foot: Secondary | ICD-10-CM

## 2018-12-24 DIAGNOSIS — M7989 Other specified soft tissue disorders: Secondary | ICD-10-CM

## 2018-12-28 NOTE — Progress Notes (Signed)
Subjective: 63 year old female presents the office for both evaluation of right foot cyst, scalp.  She states that overall she is doing better but still present. She says it does fluctuate in size.  She has no pain to the area she denies any redness or drainage or any swelling.  She was using antibiotic ointment given a scab.Denies any systemic complaints such as fevers, chills, nausea, vomiting. No acute changes since last appointment, and no other complaints at this time.   Objective: AAO x3, NAD DP/PT pulses palpable bilaterally, CRT less than 3 seconds I dorsal lateral aspect of the foot more along the fifth metatarsal cuboid joint and what appears to be a prominent bone spur.  There is no cystic formation identified.  There is no fluid.  No hyperkeratotic tissue which is able to debride and there is no ongoing ulceration drainage or any signs of infection. No pain with calf compression, swelling, warmth, erythema  Assessment: Bony exostosis right  Plan: -All treatment options discussed with the patient including all alternatives, risks, complications.  -Discussed with her the no significant cyst today.  If it were to come back we can try to drain the area but discussed offloading today to help prevent reoccurrence. -Patient encouraged to call the office with any questions, concerns, change in symptoms.   Return if symptoms worsen or fail to improve.  Trula Slade DPM

## 2019-01-14 ENCOUNTER — Telehealth: Payer: Self-pay | Admitting: *Deleted

## 2019-01-14 NOTE — Telephone Encounter (Signed)
   Primary Cardiologist:Philip Nahser, MD  Chart reviewed as part of pre-operative protocol coverage. Patient has a history of CAD with MI x2 s/p DES in 2014 as well as PAF on Multaq and Pradaxa. She was last seen by Dr. Tamala Julian in 06/2018 for unstable angina. She underwent LHC on 07/09/2018 but did not require any interventions at that time. She has not been seen since. Therefore, she will require a follow-up visit in order to follow-up and better assess preoperative cardiovascular risk.  Pre-op covering staff: - Please schedule appointment and call patient to inform them. - Please contact requesting surgeon's office via preferred method (i.e, phone, fax) to inform them of need for appointment prior to surgery.  Will also route this message to pharmacy for advice on holding Pradaxa prior to procedure so that this information is available at time of patient's appointment.   Darreld Mclean, PA-C  01/14/2019, 10:00 AM

## 2019-01-14 NOTE — Telephone Encounter (Signed)
Surgery clearance appt 11/18 withn Vin Bhagatt, PAC. I will remove fro the pre op call back pool. I will forward clearance notes to [facility] for appt.

## 2019-01-14 NOTE — Telephone Encounter (Signed)
I have left a message for pt to call back and schedule appt for surgery clearance.

## 2019-01-14 NOTE — Telephone Encounter (Signed)
   [facility] Pre-operative Risk Assessment    Request for surgical clearance:  1. What type of surgery is being performed? BREAST EXCISION   2. When is this surgery scheduled? TBD   3. What type of clearance is required (medical clearance vs. Pharmacy clearance to hold med vs. Both)? MEDICAL  4. Are there any medications that need to be held prior to surgery and how long? PRADAXA   5. Practice name and name of physician performing surgery? [facility]; DR. PAUL TOTH   6. What is your office phone number [phone]    7.   What is your office fax number [phone]  8.   Anesthesia type (None, local, MAC, general) ? GENERAL   Brittany Buckley 01/14/2019, 9:31 AM  _________________________________________________________________   (provider comments below)

## 2019-01-14 NOTE — Telephone Encounter (Signed)
Patient with diagnosis of afib on Pradaxa for anticoagulation.    Procedure: BREAST EXCISION  Date of procedure: TBD  CHADS2-VASc score of  4 (HTN, DM2, CAD, female)   CrCl 88 ml/min  Per office protocol, patient can hold Pradaxa for 2 days prior to procedure.

## 2019-01-15 ENCOUNTER — Telehealth: Payer: Self-pay | Admitting: Cardiovascular Disease

## 2019-01-15 NOTE — Telephone Encounter (Signed)
New Message  Patient is calling in to see if she is able to do her preop clearance appointment virtually so that she is able to get it done prior to her appointment. If not, she would like to know if there is a way to work her in so that she can be seen in office prior to 01/21/19. 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 Please give patient a call back to discuss.

## 2019-01-15 NOTE — Telephone Encounter (Signed)
I called this patient to offer an appointment tomorrow at 12:15 pm in person with Richardson Dopp, PA-C.  She has to work tomorrow [employer]) and can't make it. She would be available Thursday after 1pm and Friday all day if anyone has any openings come up.  She says she does not understand why she needs a visit in person instead of over the phone because all they are going to do is tell her to hold her pradaxa.  I explained importance/reasoning behind an in person visit in this case.  Will route this information as FYI to pre op pool, and Dr. Curt Bears.  It is unclear to me who her primary cardiologist is.

## 2019-01-16 ENCOUNTER — Telehealth: Payer: Commercial Managed Care - PPO | Admitting: Physician Assistant

## 2019-01-16 NOTE — Telephone Encounter (Signed)
Called and left message for patient to call back and ask to speak with pre-op team to see if we could clear her over the phone.

## 2019-01-16 NOTE — Telephone Encounter (Signed)
    We will call this patient today, 01/16/2019 to see if she may be cleared over the phone   Thank you  Sharee Pimple

## 2019-01-16 NOTE — Telephone Encounter (Signed)
Patient called back. 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 Probably in about 30 mins to an hour.

## 2019-01-16 NOTE — Telephone Encounter (Signed)
Pt has been scheduled to see [name], NP 02/01/19.

## 2019-01-16 NOTE — Telephone Encounter (Signed)
OK to proceed with procedure as stated below. However, advised patient to call and schedule routine follow-up visit at earliest convenience.

## 2019-01-16 NOTE — Telephone Encounter (Signed)
   Primary Cardiologist: Mertie Moores, MD  Chart reviewed as part of pre-operative protocol coverage. Patient has a history of CAD with MI x2 s/p DES in 2014 as well as PAF on Multaq and Pradaxa. She was last seen by Dr. Tamala Julian in 06/2018 for chest pain concerning for unstable angina. She underwent LHC on 07/09/2018 which showed no severe stenosis that required intervention. Patient thinks episodes of chest pain was due to anxiety. She has since been started on a new medication and denies any additional chest pain. No shortness of breath, palpitations, syncope, or CHF symptoms. Able to complete > 4.0 METs. Per Revised Cardiac Risk Index, considered low risk procedure. Given past medical history and time since last visit, based on ACC/AHA guidelines, Brittany Buckley would be at acceptable risk for the planned procedure without further cardiovascular testing.   Per Pharmacy: "Per office protocol, patient can hold Pradaxa for 2 days prior to procedure."  I will route this recommendation to the requesting party via [facility] fax function and remove from pre-op pool.  Please call with questions.  Darreld Mclean, PA-C 01/16/2019, 1:24 PM

## 2019-01-16 NOTE — Telephone Encounter (Signed)
I have faxed notes to surgeon Dr. Autumn Messing. I will remove from pre op call back pool.

## 2019-01-17 ENCOUNTER — Other Ambulatory Visit (HOSPITAL_COMMUNITY): Payer: Commercial Managed Care - PPO

## 2019-01-17 ENCOUNTER — Other Ambulatory Visit (HOSPITAL_COMMUNITY)
Admission: RE | Admit: 2019-01-17 | Discharge: 2019-01-17 | Disposition: A | Discharge status: Home / Self Care | Payer: Commercial Managed Care - PPO | Source: Ambulatory Visit | Attending: General Surgery | Admitting: General Surgery

## 2019-01-17 DIAGNOSIS — Z20828 Contact with and (suspected) exposure to other viral communicable diseases: Secondary | ICD-10-CM | POA: Insufficient documentation

## 2019-01-17 DIAGNOSIS — Z01812 Encounter for preprocedural laboratory examination: Secondary | ICD-10-CM | POA: Diagnosis present

## 2019-01-18 ENCOUNTER — Encounter (HOSPITAL_COMMUNITY): Payer: Self-pay | Admitting: *Deleted

## 2019-01-18 LAB — NOVEL CORONAVIRUS, NAA (HOSP ORDER, SEND-OUT TO REF LAB; TAT 18-24 HRS): SARS-CoV-2, NAA: NOT DETECTED

## 2019-01-18 NOTE — Progress Notes (Addendum)
Ms Carnathan denies chest pain or shortness of breath. I informed patient of arrival time on Monday,she  Asked if she may eat breakfast, I said no, Ms Fitch said that there blood sugar will drop, I informed her that I need to call Dr Marlou Starks and call her back.  I called [facility] and spoke with Chemir, she said that Dr Marlou Starks is in surgery, she will call him and call me back. Ms Gilford has type II diabetes, or I instructed Ms Yasuda to hold Jardanice Sunday and Monday am. Chemir was unable to get in touch with Dr. Marlou Starks, I called Dr Marlou Starks  and spoke with him ,sharing his patient's concerns regarding no breakfast and CBG dropping. Dr Marlou Starks instructed me to call anesthesia. I called Dr Linna Caprice , Dr Linna Caprice said patient may have light breakfast before 0700 and clear liquids until 1200. I informed Mrs Hedeen and we discussed what a light breakfast consist of, and we discussed what clear liquids are , patient said she will drink tea, and possibly Cran-Apple juice. I instructed patient to check CBG after awaking and every 2 hours until arrival  to the hospital.  I Instructed patient if CBG is less than 70 to take 4 Glucose Tabs.. Recheck CBG in 15 minutes then call pre- op desk at [phone] for further instructions.

## 2019-01-21 ENCOUNTER — Ambulatory Visit (HOSPITAL_COMMUNITY)
Admission: RE | Admit: 2019-01-21 | Discharge: 2019-01-21 | Disposition: A | Discharge status: Home / Self Care | Payer: Commercial Managed Care - PPO | Attending: General Surgery | Admitting: General Surgery

## 2019-01-21 ENCOUNTER — Encounter (HOSPITAL_COMMUNITY)
Admission: RE | Disposition: A | Discharge status: Home / Self Care | Payer: Self-pay | Source: Home / Self Care | Attending: General Surgery

## 2019-01-21 ENCOUNTER — Ambulatory Visit (HOSPITAL_COMMUNITY): Payer: Commercial Managed Care - PPO | Admitting: Certified Registered"

## 2019-01-21 ENCOUNTER — Other Ambulatory Visit: Payer: Self-pay

## 2019-01-21 DIAGNOSIS — D1809 Hemangioma of other sites: Secondary | ICD-10-CM | POA: Insufficient documentation

## 2019-01-21 DIAGNOSIS — Z955 Presence of coronary angioplasty implant and graft: Secondary | ICD-10-CM | POA: Diagnosis not present

## 2019-01-21 DIAGNOSIS — N6320 Unspecified lump in the left breast, unspecified quadrant: Secondary | ICD-10-CM | POA: Diagnosis present

## 2019-01-21 DIAGNOSIS — Z885 Allergy status to narcotic agent status: Secondary | ICD-10-CM | POA: Diagnosis not present

## 2019-01-21 DIAGNOSIS — Z79899 Other long term (current) drug therapy: Secondary | ICD-10-CM | POA: Diagnosis not present

## 2019-01-21 DIAGNOSIS — Z7901 Long term (current) use of anticoagulants: Secondary | ICD-10-CM | POA: Diagnosis not present

## 2019-01-21 DIAGNOSIS — I252 Old myocardial infarction: Secondary | ICD-10-CM | POA: Insufficient documentation

## 2019-01-21 DIAGNOSIS — K219 Gastro-esophageal reflux disease without esophagitis: Secondary | ICD-10-CM | POA: Diagnosis not present

## 2019-01-21 DIAGNOSIS — F419 Anxiety disorder, unspecified: Secondary | ICD-10-CM | POA: Diagnosis not present

## 2019-01-21 DIAGNOSIS — I10 Essential (primary) hypertension: Secondary | ICD-10-CM | POA: Insufficient documentation

## 2019-01-21 DIAGNOSIS — Z8711 Personal history of peptic ulcer disease: Secondary | ICD-10-CM | POA: Insufficient documentation

## 2019-01-21 DIAGNOSIS — Z7984 Long term (current) use of oral hypoglycemic drugs: Secondary | ICD-10-CM | POA: Diagnosis not present

## 2019-01-21 DIAGNOSIS — M1990 Unspecified osteoarthritis, unspecified site: Secondary | ICD-10-CM | POA: Diagnosis not present

## 2019-01-21 DIAGNOSIS — E119 Type 2 diabetes mellitus without complications: Secondary | ICD-10-CM | POA: Diagnosis not present

## 2019-01-21 DIAGNOSIS — I4891 Unspecified atrial fibrillation: Secondary | ICD-10-CM | POA: Insufficient documentation

## 2019-01-21 DIAGNOSIS — I2510 Atherosclerotic heart disease of native coronary artery without angina pectoris: Secondary | ICD-10-CM | POA: Insufficient documentation

## 2019-01-21 DIAGNOSIS — Z886 Allergy status to analgesic agent status: Secondary | ICD-10-CM | POA: Insufficient documentation

## 2019-01-21 HISTORY — DX: Gastric ulcer, unspecified as acute or chronic, without hemorrhage or perforation: K25.9

## 2019-01-21 HISTORY — PX: BREAST CYST EXCISION: SHX579

## 2019-01-21 HISTORY — DX: Attention-deficit hyperactivity disorder, unspecified type: F90.9

## 2019-01-21 LAB — BASIC METABOLIC PANEL
Anion gap: 7 (ref 5–15)
BUN: 14 mg/dL (ref 8–23)
CO2: 25 mmol/L (ref 22–32)
Calcium: 9.2 mg/dL (ref 8.9–10.3)
Chloride: 106 mmol/L (ref 98–111)
Creatinine, Ser: 0.77 mg/dL (ref 0.44–1.00)
GFR calc Af Amer: >60 mL/min (ref >60)
GFR calc non Af Amer: >60 mL/min (ref >60)
Glucose, Bld: 89 mg/dL (ref 70–99)
Potassium: 4.2 mmol/L (ref 3.5–5.1)
Sodium: 138 mmol/L (ref 135–145)

## 2019-01-21 LAB — CBC
HCT: 44 % (ref 36.0–46.0)
Hemoglobin: 14.4 g/dL (ref 12.0–15.0)
MCH: 28.1 pg (ref 26.0–34.0)
MCHC: 32.7 g/dL (ref 30.0–36.0)
MCV: 85.8 fL (ref 80.0–100.0)
Platelets: 203 10*3/uL (ref 150–400)
RBC: 5.13 MIL/uL — ABNORMAL HIGH (ref 3.87–5.11)
RDW: 13 % (ref 11.5–15.5)
WBC: 6.1 10*3/uL (ref 4.0–10.5)
nRBC: 0 % (ref 0.0–0.2)

## 2019-01-21 LAB — APTT: aPTT: 22 seconds — ABNORMAL LOW (ref 24–36)

## 2019-01-21 LAB — GLUCOSE, CAPILLARY
Glucose-Capillary: 72 mg/dL (ref 70–99)
Glucose-Capillary: 71 mg/dL (ref 70–99)

## 2019-01-21 SURGERY — EXCISION, CYST, BREAST
Anesthesia: General | Site: Breast | Laterality: Left

## 2019-01-21 MED ORDER — PROPOFOL 10 MG/ML IV BOLUS
INTRAVENOUS | Status: DC | PRN
  Administered 2019-01-21: 150 mg via INTRAVENOUS

## 2019-01-21 MED ORDER — HYDROMORPHONE HCL 1 MG/ML IJ SOLN: 0.2500 mg | INTRAMUSCULAR | Status: DC | PRN

## 2019-01-21 MED ORDER — DEXAMETHASONE SODIUM PHOSPHATE 4 MG/ML IJ SOLN
INTRAMUSCULAR | Status: DC | PRN
  Administered 2019-01-21: 4 mg via INTRAVENOUS

## 2019-01-21 MED ORDER — LIDOCAINE 2% (20 MG/ML) 5 ML SYRINGE
INTRAMUSCULAR | Status: AC
  Filled 2019-01-21: qty 5

## 2019-01-21 MED ORDER — ONDANSETRON HCL 4 MG/2ML IJ SOLN
INTRAMUSCULAR | Status: AC
  Filled 2019-01-21: qty 2

## 2019-01-21 MED ORDER — FENTANYL CITRATE (PF) 250 MCG/5ML IJ SOLN
INTRAMUSCULAR | Status: AC
  Filled 2019-01-21: qty 5

## 2019-01-21 MED ORDER — CARVEDILOL 3.125 MG PO TABS
6.2500 mg | ORAL_TABLET | Freq: Once | ORAL | Status: AC
  Administered 2019-01-21: 6.25 mg via ORAL

## 2019-01-21 MED ORDER — BUPIVACAINE HCL 0.25 % IJ SOLN
INTRAMUSCULAR | Status: DC | PRN
  Administered 2019-01-21: 10 mL

## 2019-01-21 MED ORDER — LACTATED RINGERS IV SOLN
INTRAVENOUS | Status: DC
  Administered 2019-01-21: 14:00:00 via INTRAVENOUS

## 2019-01-21 MED ORDER — ONDANSETRON HCL 4 MG/2ML IJ SOLN
INTRAMUSCULAR | Status: DC | PRN
  Administered 2019-01-21: 4 mg via INTRAVENOUS

## 2019-01-21 MED ORDER — BUPIVACAINE HCL (PF) 0.25 % IJ SOLN
INTRAMUSCULAR | Status: AC
  Filled 2019-01-21: qty 30

## 2019-01-21 MED ORDER — MIDAZOLAM HCL 2 MG/2ML IJ SOLN
INTRAMUSCULAR | Status: AC
  Filled 2019-01-21: qty 2

## 2019-01-21 MED ORDER — CARVEDILOL 3.125 MG PO TABS
ORAL_TABLET | ORAL | Status: AC
  Filled 2019-01-21: qty 2

## 2019-01-21 MED ORDER — TRAMADOL HCL 50 MG PO TABS: 50.0000 mg | ORAL_TABLET | Freq: Four times a day (QID) | ORAL | 0 refills | Status: DC | PRN

## 2019-01-21 MED ORDER — CEFAZOLIN SODIUM-DEXTROSE 2-3 GM-%(50ML) IV SOLR
INTRAVENOUS | Status: DC | PRN
  Administered 2019-01-21: 2 g via INTRAVENOUS

## 2019-01-21 MED ORDER — EPHEDRINE SULFATE-NACL 50-0.9 MG/10ML-% IV SOSY
PREFILLED_SYRINGE | INTRAVENOUS | Status: DC | PRN
  Administered 2019-01-21 (×2): 5 mg via INTRAVENOUS
  Administered 2019-01-21: 10 mg via INTRAVENOUS

## 2019-01-21 MED ORDER — LIDOCAINE HCL (CARDIAC) PF 100 MG/5ML IV SOSY
PREFILLED_SYRINGE | INTRAVENOUS | Status: DC | PRN
  Administered 2019-01-21: 100 mg via INTRAVENOUS

## 2019-01-21 MED ORDER — PROMETHAZINE HCL 25 MG/ML IJ SOLN: 6.2500 mg | INTRAMUSCULAR | Status: DC | PRN

## 2019-01-21 MED ORDER — EPHEDRINE 5 MG/ML INJ
INTRAVENOUS | Status: AC
  Filled 2019-01-21: qty 10

## 2019-01-21 MED ORDER — MIDAZOLAM HCL 5 MG/5ML IJ SOLN
INTRAMUSCULAR | Status: DC | PRN
  Administered 2019-01-21: 2 mg via INTRAVENOUS

## 2019-01-21 MED ORDER — DEXAMETHASONE SODIUM PHOSPHATE 10 MG/ML IJ SOLN
INTRAMUSCULAR | Status: AC
  Filled 2019-01-21: qty 1

## 2019-01-21 MED ORDER — FENTANYL CITRATE (PF) 100 MCG/2ML IJ SOLN
INTRAMUSCULAR | Status: DC | PRN
  Administered 2019-01-21: 50 ug via INTRAVENOUS

## 2019-01-21 SURGICAL SUPPLY — 35 items
APPLIER CLIP 9.375 MED OPEN (MISCELLANEOUS)
BINDER BREAST LRG (GAUZE/BANDAGES/DRESSINGS) IMPLANT
BINDER BREAST XLRG (GAUZE/BANDAGES/DRESSINGS) IMPLANT
CANISTER SUCT 3000ML PPV (MISCELLANEOUS) IMPLANT
CHLORAPREP W/TINT 26 (MISCELLANEOUS) ×2 IMPLANT
CLIP APPLIE 9.375 MED OPEN (MISCELLANEOUS) IMPLANT
CONT SPEC 4OZ CLIKSEAL STRL BL (MISCELLANEOUS) ×2 IMPLANT
COVER SURGICAL LIGHT HANDLE (MISCELLANEOUS) ×2 IMPLANT
COVER WAND RF STERILE (DRAPES) ×2 IMPLANT
DERMABOND ADVANCED (GAUZE/BANDAGES/DRESSINGS) ×1
DERMABOND ADVANCED .7 DNX12 (GAUZE/BANDAGES/DRESSINGS) ×1 IMPLANT
DRAPE LAPAROSCOPIC ABDOMINAL (DRAPES) ×2 IMPLANT
ELECT COATED BLADE 2.86 ST (ELECTRODE) ×2 IMPLANT
ELECT REM PT RETURN 9FT ADLT (ELECTROSURGICAL) ×2
ELECTRODE REM PT RTRN 9FT ADLT (ELECTROSURGICAL) ×1 IMPLANT
GLOVE BIO SURGEON STRL SZ7.5 (GLOVE) ×2 IMPLANT
GOWN STRL REUS W/ TWL LRG LVL3 (GOWN DISPOSABLE) ×2 IMPLANT
GOWN STRL REUS W/TWL LRG LVL3 (GOWN DISPOSABLE) ×2
KIT BASIN OR (CUSTOM PROCEDURE TRAY) ×2 IMPLANT
KIT TURNOVER KIT B (KITS) ×2 IMPLANT
LIGHT WAVEGUIDE WIDE FLAT (MISCELLANEOUS) IMPLANT
NDL HYPO 25GX1X1/2 BEV (NEEDLE) ×1 IMPLANT
NEEDLE HYPO 25GX1X1/2 BEV (NEEDLE) ×2 IMPLANT
NS IRRIG 1000ML POUR BTL (IV SOLUTION) ×2 IMPLANT
PACK GENERAL/GYN (CUSTOM PROCEDURE TRAY) ×2 IMPLANT
PAD ARMBOARD 7.5X6 YLW CONV (MISCELLANEOUS) ×2 IMPLANT
PENCIL SMOKE EVACUATOR (MISCELLANEOUS) ×2 IMPLANT
SPONGE LAP 18X18 RF (DISPOSABLE) ×2 IMPLANT
SUT MNCRL AB 4-0 PS2 18 (SUTURE) ×2 IMPLANT
SUT VIC AB 3-0 SH 27 (SUTURE) ×1
SUT VIC AB 3-0 SH 27XBRD (SUTURE) ×1 IMPLANT
SUT VIC AB 3-0 SH 8-18 (SUTURE) ×1 IMPLANT
SYR CONTROL 10ML LL (SYRINGE) ×2 IMPLANT
TOWEL GREEN STERILE (TOWEL DISPOSABLE) ×2 IMPLANT
TOWEL GREEN STERILE FF (TOWEL DISPOSABLE) ×2 IMPLANT

## 2019-01-21 NOTE — Anesthesia Preprocedure Evaluation (Signed)
Anesthesia Evaluation  Patient identified by MRN, date of birth, ID band Patient awake    Reviewed: Allergy & Precautions, NPO status , Patient's Chart, lab work & pertinent test results, reviewed documented beta blocker date and time   Airway Mallampati: II  TM Distance: >3 FB Neck ROM: Full    Dental  (+) Dental Advisory Given   Pulmonary asthma , pneumonia,    Pulmonary exam normal breath sounds clear to auscultation       Cardiovascular hypertension, Pt. on medications and Pt. on home beta blockers + angina + CAD, + Past MI and + Cardiac Stents  Normal cardiovascular exam+ dysrhythmias Atrial Fibrillation + Valvular Problems/Murmurs  Rhythm:Regular Rate:Normal  LHC [id number]  Normal systolic function with EF 65% or greater, and moderate to severe elevation of LVEDP consistent with diastolic heart failure  Short, widely patent left main.  Large wraparound LAD with mid vessel luminal irregularities less than 30%.  Circumflex containing mid to distal vessel 50% narrowing within which a previously placed stent is patent and contains 30% diffuse in-stent restenosis.  Dominant right coronary with segmental 50 to 60% mid stenosis.  Patent mid to distal stent with 50% or less diffuse in-stent restenosis.  Beyond the stent there is relatively focal eccentric 60 to 70% stenosis.  PDA contains mid 70% stenosis.   Echo 03/2016 - Left ventricle: The cavity size was normal. There was mild focal basal hypertrophy of the septum. Systolic function was normal. The estimated ejection fraction was in the range of 60% to 65%. Wall motion was normal; there were no regional wall motion abnormalities. Doppler parameters are consistent with abnormal left ventricular relaxation (grade 1 diastolic dysfunction). - Aortic valve: Trileaflet; mildly thickened, mildly calcified   leaflets. - Left atrium: The atrium was mildly dilated. Volume/bsa, ES,  (1-plane  Simpson&'s, A2C): 38.1 ml/m^2.    Neuro/Psych  Headaches, PSYCHIATRIC DISORDERS Anxiety    GI/Hepatic Neg liver ROS, PUD, GERD  ,  Endo/Other  diabetes  Renal/GU negative Renal ROS     Musculoskeletal  (+) Arthritis ,   Abdominal   Peds  Hematology negative hematology ROS (+)   Anesthesia Other Findings   Reproductive/Obstetrics negative OB ROS                             Anesthesia Physical Anesthesia Plan  ASA: III  Anesthesia Plan: General   Post-op Pain Management:    Induction: Intravenous  PONV Risk Score and Plan: 4 or greater and Ondansetron and Dexamethasone  Airway Management Planned: LMA  Additional Equipment: None  Intra-op Plan:   Post-operative Plan: Extubation in OR  Informed Consent: I have reviewed the patients History and Physical, chart, labs and discussed the procedure including the risks, benefits and alternatives for the proposed anesthesia with the patient or authorized representative who has indicated his/her understanding and acceptance.     Dental advisory given  Plan Discussed with: CRNA  Anesthesia Plan Comments:         Anesthesia Quick Evaluation

## 2019-01-21 NOTE — Interval H&P Note (Signed)
History and Physical Interval Note:  01/21/2019 1:57 PM  Brittany Buckley  has presented today for surgery, with the diagnosis of LEFT BREAST MASS.  The various methods of treatment have been discussed with the patient and family. After consideration of risks, benefits and other options for treatment, the patient has consented to  Procedure(s): EXCISION OF LEFT BREAST MASS (Left) as a surgical intervention.  The patient's history has been reviewed, patient examined, no change in status, stable for surgery.  I have reviewed the patient's chart and labs.  Questions were answered to the patient's satisfaction.     Autumn Messing III

## 2019-01-21 NOTE — Op Note (Signed)
01/21/2019  3:20 PM  PATIENT:  Brittany Buckley  63 y.o. female  PRE-OPERATIVE DIAGNOSIS:  LEFT BREAST MASS  POST-OPERATIVE DIAGNOSIS:  LEFT BREAST MASS  PROCEDURE:  Procedure(s): EXCISION OF LEFT BREAST MASS (Left)  SURGEON:  Surgeon(s) and Role:    Jovita Kussmaul, MD - Primary  PHYSICIAN ASSISTANT:   ASSISTANTS: none   ANESTHESIA:   local and general  EBL:  5 mL   BLOOD ADMINISTERED:none  DRAINS: none   LOCAL MEDICATIONS USED:  MARCAINE     SPECIMEN:  Source of Specimen:  left breast mass  DISPOSITION OF SPECIMEN:  PATHOLOGY  COUNTS:  YES  TOURNIQUET:  * No tourniquets in log *  DICTATION: .Dragon Dictation   After informed consent was obtained the patient was brought to the operating room and placed in the supine position on the operating table.  After adequate induction of general anesthesia the patient's left breast was prepped with ChloraPrep, allowed to dry, and draped in usual sterile manner.  An appropriate timeout was performed.  The patient had a mass on the very inner part of the left breast with 2 adjacent moles on the skin.  An elliptical incision was made around the moles and mass with a 15 blade knife.  The incision was carried through the skin and subcutaneous tissue sharply with electrocautery.  The entire mass was removed from the patient sharply with the electrocautery.  Once the mass was removed it was oriented with a short stitch on the superior surface and a long stitch on the lateral surface.  The mass was sent to pathology for further evaluation.  Hemostasis was achieved using the Bovie electrocautery.  The area was infiltrated with quarter percent Marcaine.  The deep layer of the wound was then closed with interrupted 3-0 Vicryl stitches.  The skin was then closed with a running 4-0 Monocryl subcuticular stitch.  Dermabond dressings were applied.  The patient tolerated the procedure well.  At the end of the case all needle sponge and instrument  counts were correct.  The patient was then awakened and taken to recovery in stable condition.  Once the specimen was removed it measured about 5 x 2 cm.  PLAN OF CARE: Discharge to home after PACU  PATIENT DISPOSITION:  PACU - hemodynamically stable.   Delay start of Pharmacological VTE agent (>24hrs) due to surgical blood loss or risk of bleeding: not applicable

## 2019-01-21 NOTE — Transfer of Care (Signed)
Immediate Anesthesia Transfer of Care Note  Patient: Brittany Buckley  Procedure(s) Performed: EXCISION OF LEFT BREAST MASS (Left Breast)  Patient Location: PACU  Anesthesia Type:General  Level of Consciousness: awake, alert  and oriented  Airway & Oxygen Therapy: Patient Spontanous Breathing and Patient connected to face mask oxygen  Post-op Assessment: Report given to RN and Post -op Vital signs reviewed and stable  Post vital signs: Reviewed and stable  Last Vitals:  Vitals Value Taken Time  BP 138/73 01/21/19 1528  Temp 36.4 C 01/21/19 1528  Pulse 58 01/21/19 1533  Resp 15 01/21/19 1533  SpO2 100 % 01/21/19 1533  Vitals shown include unvalidated device data.  Last Pain:  Vitals:   01/21/19 1528  PainSc: (P) Asleep         Complications: No apparent anesthesia complications

## 2019-01-21 NOTE — Anesthesia Procedure Notes (Signed)
Procedure Name: LMA Insertion Date/Time: 01/21/2019 2:51 PM Performed by: Griffin Dakin, CRNA Pre-anesthesia Checklist: Patient identified, Emergency Drugs available, Suction available and Patient being monitored Patient Re-evaluated:Patient Re-evaluated prior to induction Oxygen Delivery Method: Circle system utilized Preoxygenation: Pre-oxygenation with 100% oxygen Induction Type: IV induction Ventilation: Mask ventilation without difficulty LMA: LMA inserted LMA Size: 4.0 Tube type: Oral Number of attempts: 1 Placement Confirmation: positive ETCO2 and breath sounds checked- equal and bilateral Tube secured with: Tape Dental Injury: Teeth and Oropharynx as per pre-operative assessment

## 2019-01-21 NOTE — H&P (Signed)
[place]  Location: [facility] Patient #: [id number] DOB: 1955-06-04 Married / Language: English / Race: White Female   History of Present Illness  The patient is a 63 year old female who presents with a breast mass. We're asked to see the patient in consultation by Dr. Crist Infante to evaluate her for a left breast mass. 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 She also had a small lump just lateral to this which over the last several months has gotten larger. It is also become discolored and purple. She had an ultrasound that was suggestive of a possible sebaceous cyst but the mass had internal blood flow and was too superficial to biopsy. Because of this the recommendation is to have this thing removed. She would also like to have this done. She is on blood thinners for 2 heart stents that were placed about 4 years ago. She has had about 3 myocardial infarctions and is followed by Dr. Curt Bears   Past Surgical History  Breast Biopsy  Right. Hysterectomy (not due to cancer) - Partial   Allergies Demerol *ANALGESICS - OPIOID*  Codeine Sulfate *ANALGESICS - OPIOID*  Allergies Reconciled   Medication History  Atorvastatin Calcium (80MG  Tablet, Oral) Active. buPROPion HCl ER (XL) (150MG  Tablet ER 24HR, Oral) Active. Lisinopril (40MG  Tablet, Oral) Active. metFORMIN HCl (1000MG  Tablet, Oral) Active. Pradaxa (150MG  Capsule, Oral) Active. Multaq (400MG  Tablet, Oral) Active. prednisoLONE Acetate (1% Suspension, Ophthalmic) Active. Carvedilol (12.5MG  Tablet, Oral) Active. Nitrostat (0.4MG  Tab Sublingual, Sublingual) Active. Medications Reconciled    Review of Systems  General Not Present- Appetite Loss, Chills, Fatigue, Fever, Night Sweats, Weight Gain and Weight Loss. Note: All other systems negative (unless as noted in HPI & included Review of Systems) Skin Not Present-  Change in Wart/Mole, Dryness, Hives, Jaundice, New Lesions, Non-Healing Wounds, Rash and Ulcer. HEENT Not Present- Earache, Hearing Loss, Hoarseness, Nose Bleed, Oral Ulcers, Ringing in the Ears, Seasonal Allergies, Sinus Pain, Sore Throat, Visual Disturbances, Wears glasses/contact lenses and Yellow Eyes. Respiratory Not Present- Bloody sputum, Chronic Cough, Difficulty Breathing, Snoring and Wheezing. Breast Not Present- Breast Mass, Breast Pain, Nipple Discharge and Skin Changes. Cardiovascular Not Present- Chest Pain, Difficulty Breathing Lying Down, Leg Cramps, Palpitations, Rapid Heart Rate, Shortness of Breath and Swelling of Extremities. Gastrointestinal Not Present- Abdominal Pain, Bloating, Bloody Stool, Change in Bowel Habits, Chronic diarrhea, Constipation, Difficulty Swallowing, Excessive gas, Gets full quickly at meals, Hemorrhoids, Indigestion, Nausea, Rectal Pain and Vomiting. Female Genitourinary Not Present- Frequency, Nocturia, Painful Urination, Pelvic Pain and Urgency. Musculoskeletal Not Present- Back Pain, Joint Pain, Joint Stiffness, Muscle Pain, Muscle Weakness and Swelling of Extremities. Neurological Not Present- Decreased Memory, Fainting, Headaches, Numbness, Seizures, Tingling, Tremor, Trouble walking and Weakness. Psychiatric Not Present- Anxiety, Bipolar, Change in Sleep Pattern, Depression, Fearful and Frequent crying. Endocrine Not Present- Cold Intolerance, Excessive Hunger, Hair Changes, Heat Intolerance, Hot flashes and New Diabetes. Hematology Not Present- Blood Thinners, Easy Bruising, Excessive bleeding, Gland problems, HIV and Persistent Infections.  Vitals Weight: 187.6 lb Height: 68in Body Surface Area: 1.99 m Body Mass Index: 28.52 kg/m  Temp.: 98.30F  Pulse: 68 (Regular)  BP: 150/98 (Sitting, Left Arm, Standard)       Physical Exam  General Mental Status-Alert. General Appearance-Consistent with stated age. Hydration-Well  hydrated. Voice-Normal.  Head and Neck Head-normocephalic, atraumatic with no lesions or palpable masses. Trachea-midline. Thyroid Gland Characteristics - normal size and consistency.  Eye  Eyeball - Bilateral-Extraocular movements intact. Sclera/Conjunctiva - Bilateral-No scleral icterus.  Chest and Lung Exam Chest and lung exam reveals -quiet, even and easy respiratory effort with no use of accessory muscles and on auscultation, normal breath sounds, no adventitious sounds and normal vocal resonance. Inspection Chest Wall - Normal. Back - normal.  Breast Note: In the very inner aspect of the left breast there is a purplish discoloration that is raised with a fullness beneath it that measures about 2 cm. Just medial and superior to this are what looks like 2 small moles on the skin. There is no other palpable mass in either breast. There is no palpable axillary, supraclavicular, or cervical lymphadenopathy.   Cardiovascular Cardiovascular examination reveals -normal heart sounds, regular rate and rhythm with no murmurs and normal pedal pulses bilaterally.  Abdomen Inspection Inspection of the abdomen reveals - No Hernias. Skin - Scar - no surgical scars. Palpation/Percussion Palpation and Percussion of the abdomen reveal - Soft, Non Tender, No Rebound tenderness, No Rigidity (guarding) and No hepatosplenomegaly. Auscultation Auscultation of the abdomen reveals - Bowel sounds normal.  Neurologic Neurologic evaluation reveals -alert and oriented x 3 with no impairment of recent or remote memory. Mental Status-Normal.  Musculoskeletal Normal Exam - Left-Upper Extremity Strength Normal and Lower Extremity Strength Normal. Normal Exam - Right-Upper Extremity Strength Normal and Lower Extremity Strength Normal.  Lymphatic Head & Neck  General Head & Neck Lymphatics: Bilateral - Description - Normal. Axillary  General Axillary Region: Bilateral -  Description - Normal. Tenderness - Non Tender. Femoral & Inguinal  Generalized Femoral & Inguinal Lymphatics: Bilateral - Description - Normal. Tenderness - Non Tender.    Assessment & Plan  LEFT BREAST MASS (N63.20) Impression: The patient has about a 2 cm mass in the inner aspect of the left breast that is enlarging. It was thought to be benign by ultrasound but because it is enlarging should be removed. I have discussed with her in detail the risks and benefits of the operation as well as some of the technical aspects and she understands and wishes to proceed. We will plan to get cardiac clearance prior to scheduling since she is on blood thinner for cardiac stents.

## 2019-01-21 NOTE — Progress Notes (Signed)
Dr. Lissa Hoard notified of BP.

## 2019-01-22 ENCOUNTER — Encounter (HOSPITAL_COMMUNITY): Payer: Self-pay | Admitting: General Surgery

## 2019-01-22 NOTE — Anesthesia Postprocedure Evaluation (Signed)
Anesthesia Post Note  Patient: Brittany Buckley  Procedure(s) Performed: EXCISION OF LEFT BREAST MASS (Left Breast)     Patient location during evaluation: PACU Anesthesia Type: General Level of consciousness: sedated and patient cooperative Pain management: pain level controlled Vital Signs Assessment: post-procedure vital signs reviewed and stable Respiratory status: spontaneous breathing Cardiovascular status: stable Anesthetic complications: no    Last Vitals:  Vitals:   01/21/19 1543 01/21/19 1558  BP: 135/68 135/82  Pulse: 60 60  Resp: 14 14  Temp:  (!) 36.1 C  SpO2: 97% 98%    Last Pain:  Vitals:   01/21/19 1558  PainSc: 0-No pain                 Nolon Nations

## 2019-01-23 LAB — SURGICAL PATHOLOGY

## 2019-01-26 IMAGING — CR DG CHEST 1V PORT
1 series · 1 of 1 positions shown · non-contrast
Comparison: None in PACs

CLINICAL DATA: Onset of mid chest pain this morning while showering
associated with nausea, dizziness, and shortness of breath. History
of atrial fibrillation, previous MI, and coronary stent placement.

EXAM:
PORTABLE CHEST 1 VIEW

[AP]
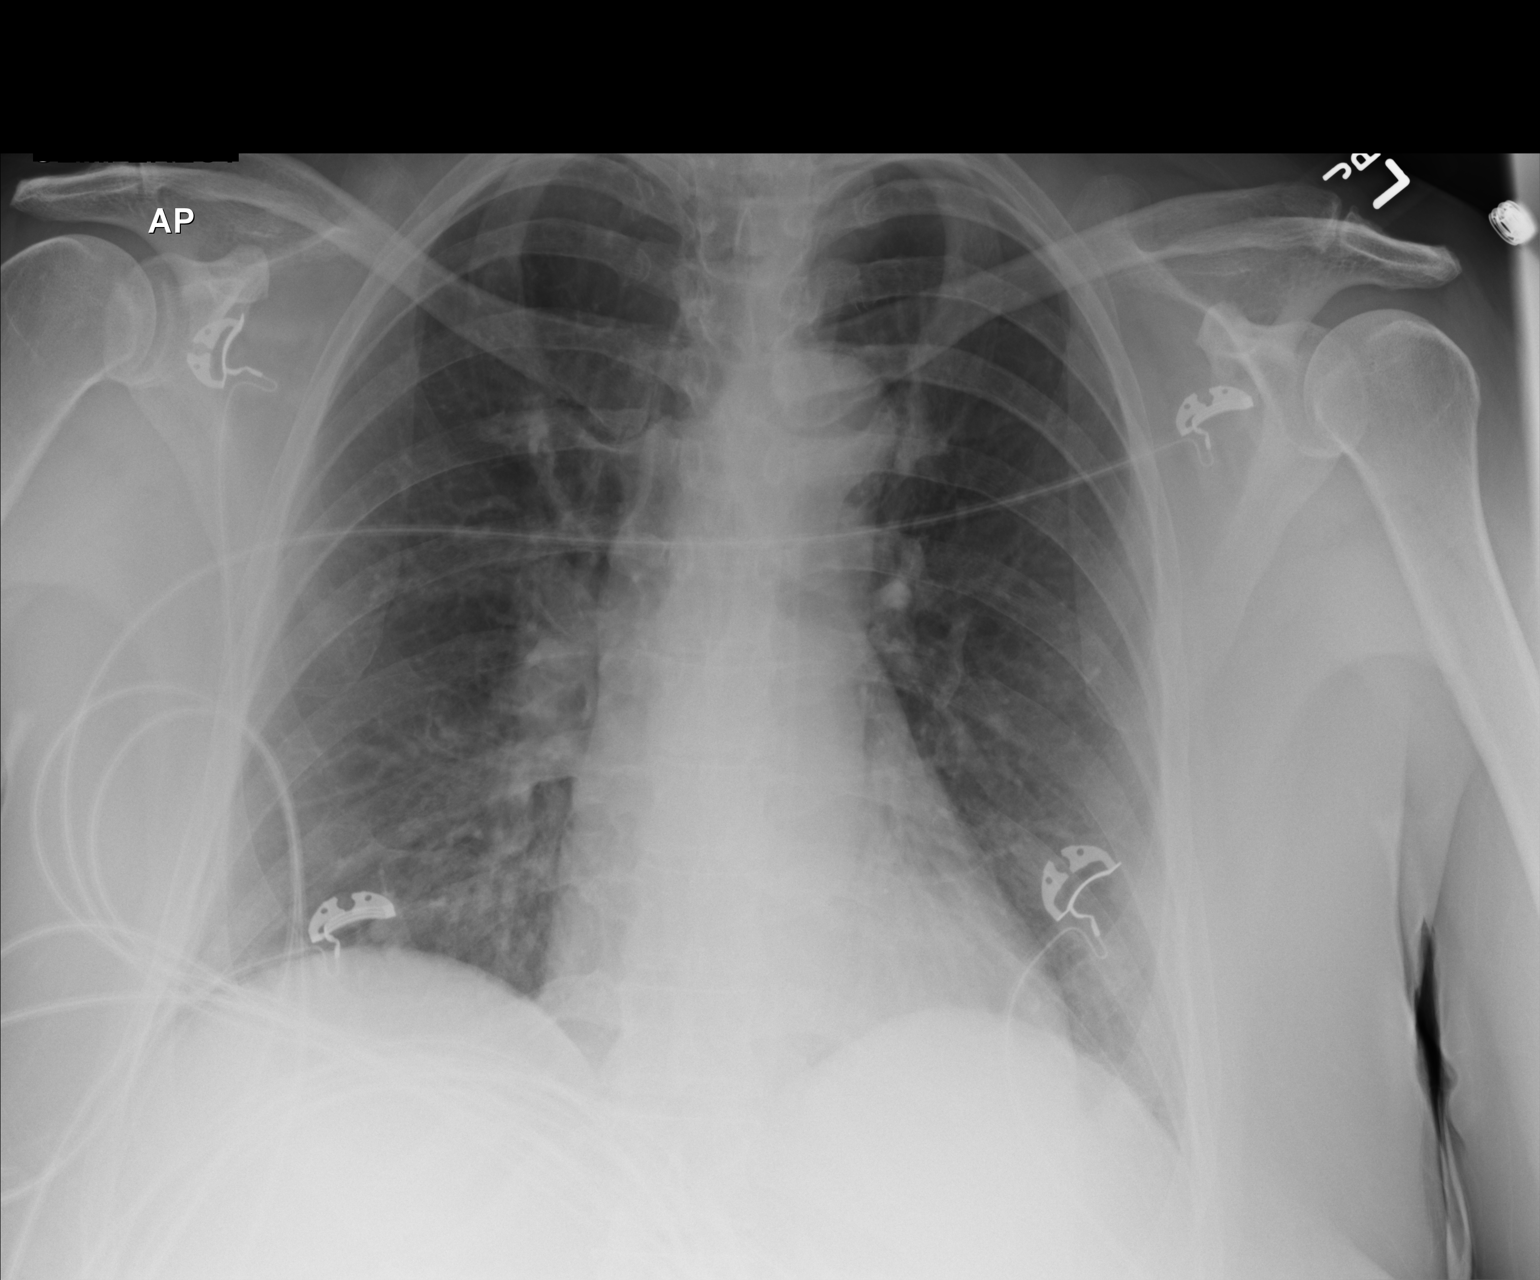

[1 of 1 positions shown; findings below may reference images not displayed]

FINDINGS: The lungs are well-expanded. There is no focal infiltrate. There is
no pleural effusion, pneumothorax, or pneumomediastinum. The heart
and pulmonary vascularity are normal. The mediastinum is normal in
width. There is calcification in the wall of the aortic arch. The
bony thorax exhibits no acute abnormality.
IMPRESSION: There is no CHF, pneumonia, nor other acute cardiopulmonary
abnormality.

Thoracic aortic atherosclerosis.

## 2019-02-01 ENCOUNTER — Encounter: Payer: Self-pay | Admitting: Cardiology

## 2019-02-01 ENCOUNTER — Other Ambulatory Visit: Payer: Self-pay

## 2019-02-01 ENCOUNTER — Ambulatory Visit (INDEPENDENT_AMBULATORY_CARE_PROVIDER_SITE_OTHER): Payer: Commercial Managed Care - PPO | Admitting: Cardiology

## 2019-02-01 VITALS — BP 150/78 | HR 55 | Ht 68.0 in | Wt 188.0 lb

## 2019-02-01 DIAGNOSIS — E785 Hyperlipidemia, unspecified: Secondary | ICD-10-CM

## 2019-02-01 DIAGNOSIS — I480 Paroxysmal atrial fibrillation: Secondary | ICD-10-CM

## 2019-02-01 DIAGNOSIS — I10 Essential (primary) hypertension: Secondary | ICD-10-CM | POA: Diagnosis not present

## 2019-02-01 DIAGNOSIS — E1159 Type 2 diabetes mellitus with other circulatory complications: Secondary | ICD-10-CM

## 2019-02-01 DIAGNOSIS — I2510 Atherosclerotic heart disease of native coronary artery without angina pectoris: Secondary | ICD-10-CM | POA: Diagnosis not present

## 2019-02-01 MED ORDER — MULTAQ 400 MG PO TABS: 400.0000 mg | ORAL_TABLET | Freq: Two times a day (BID) | ORAL | 3 refills | Status: AC

## 2019-02-01 MED ORDER — AMLODIPINE BESYLATE 5 MG PO TABS: 5.0000 mg | ORAL_TABLET | Freq: Every day | ORAL | 3 refills | Status: DC

## 2019-02-01 NOTE — Patient Instructions (Addendum)
Medication Instructions:  Your physician recommends that you continue on your current medications as directed. Please refer to the Current Medication list given to you today.  *If you need a refill on your cardiac medications before your next appointment, please call your pharmacy*  Lab Work: None ordered   If you have labs (blood work) drawn today and your tests are completely normal, you will receive your results only by: Marland Kitchen MyChart Message (if you have MyChart) OR . A paper copy in the mail If you have any lab test that is abnormal or we need to change your treatment, we will call you to review the results.  Testing/Procedures: None ordered   Follow-Up: At [facility], you and your health needs are our priority.  As part of our continuing mission to provide you with exceptional heart care, we have created designated Provider Care Teams.  These Care Teams include your primary Cardiologist (physician) and Advanced Practice Providers (APPs -  Physician Assistants and Nurse Practitioners) who all work together to provide you with the care you need, when you need it.  Your next appointment:   6 month(s)  The format for your next appointment:   In Person  Provider:   You may see Mertie Moores, MD or one of the following Advanced Practice Providers on your designated Care Team:    Richardson Dopp, PA-C  [place], [place]  Daune Perch, NP   Follow up with Dr. Curt Bears in 3 months    Other Instructions  Lifestyle Modifications to Prevent and Treat Heart Disease -Recommend heart healthy/Mediterranean diet, with whole grains, fruits, vegetables, fish, lean meats, nuts, olive oil and avocado oil.  -Limit salt intake to less than 2000 mg per day.  -Recommend moderate walking, starting slowly with a few minutes and working up to 3-5 times/week for 30-50 minutes each session. Aim for at least 150 minutes.week. Goal should be pace of 3 miles/hours, or walking 1.5 miles in 30  minutes -Recommend avoidance of tobacco products. Avoid excess alcohol. -Keep blood pressure well controlled, ideally less than 130/80.   ===========================================   Mediterranean Diet A Mediterranean diet refers to food and lifestyle choices that are based on the traditions of countries located on the [employer]. This way of eating has been shown to help prevent certain conditions and improve outcomes for people who have chronic diseases, like kidney disease and heart disease. What are tips for following this plan? Lifestyle  Cook and eat meals together with your family, when possible.  Drink enough fluid to keep your urine clear or pale yellow.  Be physically active every day. This includes: ? Aerobic exercise like running or swimming. ? Leisure activities like gardening, walking, or housework.  Get 7-8 hours of sleep each night.  If recommended by your health care provider, drink red wine in moderation. This means 1 glass a day for nonpregnant women and 2 glasses a day for men. A glass of wine equals 5 oz (150 mL). Reading food labels   Check the serving size of packaged foods. For foods such as rice and pasta, the serving size refers to the amount of cooked product, not dry.  Check the total fat in packaged foods. Avoid foods that have saturated fat or trans fats.  Check the ingredients list for added sugars, such as corn syrup. Shopping  At the grocery store, buy most of your food from the areas near the walls of the store. This includes: ? Fresh fruits and vegetables (produce). ?  Grains, beans, nuts, and seeds. Some of these may be available in unpackaged forms or large amounts (in bulk). ? Fresh seafood. ? Poultry and eggs. ? Low-fat dairy products.  Buy whole ingredients instead of prepackaged foods.  Buy fresh fruits and vegetables in-season from local farmers markets.  Buy frozen fruits and vegetables in resealable bags.  If you do not  have access to quality fresh seafood, buy precooked frozen shrimp or canned fish, such as tuna, salmon, or sardines.  Buy small amounts of raw or cooked vegetables, salads, or olives from the deli or salad bar at your store.  Stock your pantry so you always have certain foods on hand, such as olive oil, canned tuna, canned tomatoes, rice, pasta, and beans. Cooking  Cook foods with extra-virgin olive oil instead of using butter or other vegetable oils.  Have meat as a side dish, and have vegetables or grains as your main dish. This means having meat in small portions or adding small amounts of meat to foods like pasta or stew.  Use beans or vegetables instead of meat in common dishes like chili or lasagna.  Experiment with different cooking methods. Try roasting or broiling vegetables instead of steaming or sauteing them.  Add frozen vegetables to soups, stews, pasta, or rice.  Add nuts or seeds for added healthy fat at each meal. You can add these to yogurt, salads, or vegetable dishes.  Marinate fish or vegetables using olive oil, lemon juice, garlic, and fresh herbs. Meal planning   Plan to eat 1 vegetarian meal one day each week. Try to work up to 2 vegetarian meals, if possible.  Eat seafood 2 or more times a week.  Have healthy snacks readily available, such as: ? Vegetable sticks with hummus. ? [place] yogurt. ? Fruit and nut trail mix.  Eat balanced meals throughout the week. This includes: ? Fruit: 2-3 servings a day ? Vegetables: 4-5 servings a day ? Low-fat dairy: 2 servings a day ? Fish, poultry, or lean meat: 1 serving a day ? Beans and legumes: 2 or more servings a week ? Nuts and seeds: 1-2 servings a day ? Whole grains: 6-8 servings a day ? Extra-virgin olive oil: 3-4 servings a day  Limit red meat and sweets to only a few servings a month What are my food choices?  Mediterranean diet ? Recommended  Grains: Whole-grain pasta. Brown rice. Bulgar wheat.  Polenta. Couscous. Whole-wheat bread. [place].  Vegetables: Artichokes. Beets. Broccoli. Cabbage. Carrots. Eggplant. Green beans. Chard. Kale. Spinach. Onions. Leeks. Peas. Squash. Tomatoes. Peppers. Radishes.  Fruits: Apples. Apricots. Avocado. Berries. Bananas. Cherries. Dates. Figs. Grapes. Lemons. Melon. Oranges. Peaches. Plums. Pomegranate.  Meats and other protein foods: Beans. Almonds. Sunflower seeds. Pine nuts. Peanuts. [place]. Salmon. Scallops. Shrimp. [place]. Tilapia. Clams. Oysters. Eggs.  Dairy: Low-fat milk. Cheese. Greek yogurt.  Beverages: Water. Red wine. Herbal tea.  Fats and oils: Extra virgin olive oil. Avocado oil. Grape seed oil.  Sweets and desserts: [place] yogurt with honey. Baked apples. Poached pears. Trail mix.  Seasoning and other foods: Basil. Cilantro. Coriander. Cumin. Mint. Parsley. Sage. Rosemary. Tarragon. Garlic. Oregano. Thyme. Pepper. Balsalmic vinegar. Tahini. Hummus. Tomato sauce. Olives. Mushrooms. ? Limit these  Grains: Prepackaged pasta or rice dishes. Prepackaged cereal with added sugar.  Vegetables: Deep fried potatoes (french fries).  Fruits: Fruit canned in syrup.  Meats and other protein foods: Beef. Pork. Lamb. Poultry with skin. Hot dogs. Berniece Salines.  Dairy: Ice cream. Sour cream. Whole milk.  Beverages:  Juice. Sugar-sweetened soft drinks. Beer. Liquor and spirits.  Fats and oils: Butter. Canola oil. Vegetable oil. Beef fat (tallow). Lard.  Sweets and desserts: Cookies. Cakes. Pies. Candy.  Seasoning and other foods: Mayonnaise. Premade sauces and marinades. The items listed may not be a complete list. Talk with your dietitian about what dietary choices are right for you. Summary  The Mediterranean diet includes both food and lifestyle choices.  Eat a variety of fresh fruits and vegetables, beans, nuts, seeds, and whole grains.  Limit the amount of red meat and sweets that you eat.  Talk with your health care provider about  whether it is safe for you to drink red wine in moderation. This means 1 glass a day for nonpregnant women and 2 glasses a day for men. A glass of wine equals 5 oz (150 mL). This information is not intended to replace advice given to you by your health care provider. Make sure you discuss any questions you have with your health care provider. Document Released: 10/08/2015 Document Revised: 10/15/2015 Document Reviewed: 10/08/2015 Elsevier Patient Education  2020 [employer].

## 2019-02-01 NOTE — Progress Notes (Signed)
Cardiology Office Note:    Date:  02/01/2019   ID:  [name], DOB 06-Jan-1956, MRN [id number]  PCP:  Marton Redwood, MD  Cardiologist:  Mertie Moores, MD  Referring MD: Marton Redwood, MD   Chief Complaint  Patient presents with   Follow-up   Coronary Artery Disease   Atrial Fibrillation    History of Present Illness:    Brittany Buckley is a 63 y.o. female with a past medical history significant for CAD s/p MI x2 with DES in 2014 and well as PAF on Multaq and Pradaxa. She was last seen by Dr. Tamala Julian in 06/2018 for chest pain concerning for unstable angina. She underwent LHC on 07/09/2018 which showed no severe stenosis that required intervention. Patient thinks episodes of chest pain was due to anxiety.  The patient underwent left breast mass excision on 01/21/2019. Her site is still tender.  Brittany Buckley is here today for follow up. She is now on Welbutrin which she is helping her anxiety and ADHD. 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 She denies substernal chest pain and has not taken any NTG. Her breathing is fine. She denies orthopnea, PND or edema. She has a mild cough with lisinopril, does not bother her. She has not had any perceived afib.   She gets a wellness nurse visit monthly over the phone through, BP runs 150-175.   Sleep study was recommended at cath due to presence of snoring once sedated. She had a consultation but has not done sleep study yet.   Labs per [facility] Lipid Panel completed 11/12/2018 HDL 48 MG/DL 11/12/2018 LDL 77.000 mg 11/12/2018 Cholesterol, total 142.000 m 11/12/2018 Triglycerides 85.000 11/12/2018 A1C 5.600 % 11/12/2018 Glucose Random UR2 12/28/2018 MicroAlbumin Urine 207.000 12/28/2018 MicroAlbumin/Creat 52.6 MG/ 12/28/2018 BUN 16.000 mg 11/12/2018 Creatinine, Serum 0.800 mg/ 11/12/2018 TSH 1.030 micr 11/12/2018 FOBT: Normal 10/20/2017 PAP Smear s/p hyster 10/05/2016  Cardiac studies   LEFT HEART CATH AND CORONARY ANGIOGRAPHY 07/09/2018   Conclusion   Normal systolic function with EF 65% or greater, and moderate to severe elevation of LVEDP consistent with diastolic heart failure  Short, widely patent left main.  Large wraparound LAD with mid vessel luminal irregularities less than 30%.  Circumflex containing mid to distal vessel 50% narrowing within which a previously placed stent is patent and contains 30% diffuse in-stent restenosis.  Dominant right coronary with segmental 50 to 60% mid stenosis.  Patent mid to distal stent with 50% or less diffuse in-stent restenosis.  Beyond the stent there is relatively focal eccentric 60 to 70% stenosis.  PDA contains mid 70% stenosis.  RECOMMENDATIONS:  No angiographically severe stenoses are noted.  When correlated with the patient's symptoms, no lesions that would cause pain at rest are identified.  Therefore, it is potentially possible that symptoms are nonischemic.  If ischemia is responsible, a vasomotor component would need to be employed as a mechanism.  Aggressive secondary risk factor modification: High intensity statin therapy and consider PCSK-9 given high risk, A1c less than 7..  Sleep study to confirm the presence of obstructive sleep apnea which is suspected based upon snoring after sedation.  Resume Pradaxa in 12 hours      Past Medical History:  Diagnosis Date   A-fib [facility])    ADD (attention deficit disorder)    Adult ADHD    Anginal pain ([place])    Anxiety    Arthritis    "hands, feet" (06/14/2016)   Asthma    Chronic lower back pain  Chronic neck pain    Coronary artery disease    Diabetes (HCC)    Dysrhythmia    Afib   Gastric ulcer    GERD (gastroesophageal reflux disease)    Headache    "monthly" (06/14/2016)   Heart murmur    History of blood transfusion 1983   Bleed after C-Section   Hyperlipidemia    Hypertension    MI (myocardial infarction) ([place]) 2014; 2015   Migraine    "probably twice/year" (06/14/2016)    Numbness of toes    Obesity    On anticoagulant therapy    Peripheral neuropathy    Pneumonia 1990s   Thyroid nodule    Type II diabetes mellitus ([place])    Urticaria     Past Surgical History:  Procedure Laterality Date   ABDOMINAL HYSTERECTOMY     BIOPSY THYROID     BREAST CYST EXCISION Left 01/21/2019   Procedure: EXCISION OF LEFT BREAST MASS;  Surgeon: Jovita Kussmaul, MD;  Location: [place];  Service: General;  Laterality: Left;   BREAST EXCISIONAL BIOPSY Right    CESAREAN SECTION     CORONARY ANGIOPLASTY WITH STENT PLACEMENT  2014 & 2015   LEFT HEART CATH AND CORONARY ANGIOGRAPHY N/A 07/09/2018   Procedure: LEFT HEART CATH AND CORONARY ANGIOGRAPHY;  Surgeon: Belva Crome, MD;  Location: [place] [facility];  Service: Cardiovascular;  Laterality: N/A;   TONSILLECTOMY AND ADENOIDECTOMY     TRIGGER FINGER RELEASE Right    "pointer"    Current Medications: Current Meds  Medication Sig   acetaminophen (TYLENOL) 500 MG tablet Take 1,000 mg by mouth every 6 (six) hours as needed for headache.   albuterol (PROVENTIL HFA;VENTOLIN HFA) 108 (90 Base) MCG/ACT inhaler Inhale 1-2 puffs into the lungs every 6 (six) hours as needed for wheezing or shortness of breath.   atorvastatin (LIPITOR) 80 MG tablet Take 80 mg by mouth daily.   buPROPion (WELLBUTRIN XL) 150 MG 24 hr tablet Take 150 mg by mouth daily.   carvedilol (COREG) 6.25 MG tablet Take 1 tablet (6.25 mg total) by mouth 2 (two) times daily.   dabigatran (PRADAXA) 150 MG CAPS capsule Take 150 mg by mouth 2 (two) times daily.   diphenhydrAMINE (BENADRYL) 25 MG tablet Take 25 mg by mouth every 6 (six) hours as needed for itching or allergies.    dronedarone (MULTAQ) 400 MG tablet Take 1 tablet (400 mg total) by mouth 2 (two) times daily with a meal.   ezetimibe (ZETIA) 10 MG tablet Take 10 mg by mouth daily.   hydrocortisone cream 1 % Apply topically as needed for itching. (Patient taking differently: Apply 1  application topically as needed for itching. )   JARDIANCE 25 MG TABS tablet Take 25 mg by mouth daily.   lisinopril (PRINIVIL,ZESTRIL) 40 MG tablet Take 40 mg by mouth daily.   metFORMIN (GLUCOPHAGE) 1000 MG tablet Take 1,000 mg by mouth daily with breakfast.    mupirocin ointment (BACTROBAN) 2 % Apply 1 application topically 2 (two) times daily. (Patient taking differently: Apply 1 application topically daily as needed (Foot rash). )   nitroGLYCERIN (NITROSTAT) 0.4 MG SL tablet Place 0.4 mg under the tongue every 5 (five) minutes as needed for chest pain.   omeprazole (PRILOSEC) 40 MG capsule Take 1 capsule (40 mg total) by mouth 2 (two) times daily. (Patient taking differently: Take 40 mg by mouth daily. )   prednisoLONE acetate (PRED FORTE) 1 % ophthalmic suspension Place 1 drop  into the left eye 4 (four) times daily.   Semaglutide, 1 MG/DOSE, (OZEMPIC, 1 MG/DOSE,) 2 MG/1.5ML SOPN Inject 1 mg into the skin once a week. Saturday   traMADol (ULTRAM) 50 MG tablet Take 1-2 tablets (50-100 mg total) by mouth every 6 (six) hours as needed.   [DISCONTINUED] dronedarone (MULTAQ) 400 MG tablet Take 1 tablet (400 mg total) by mouth 2 (two) times daily with a meal.   [DISCONTINUED] vitamin B-12 (CYANOCOBALAMIN) 1000 MCG tablet Take 1,000 mcg by mouth once a week.      Allergies:   Codeine, Onion, Tomato, Chocolate, and Demerol [meperidine]   Social History   Socioeconomic History   Marital status: Married    Spouse name: Not on file   Number of children: Not on file   Years of education: Not on file   Highest education level: Not on file  Occupational History   Not on file  Social Needs   Financial resource strain: Not on file   Food insecurity    Worry: Not on file    Inability: Not on file   Transportation needs    Medical: Not on file    Non-medical: Not on file  Tobacco Use   Smoking status: Never Smoker   Smokeless tobacco: Never Used  Substance and Sexual  Activity   Alcohol use: Not Currently   Drug use: No   Sexual activity: Yes    Comment: married  Lifestyle   Physical activity    Days per week: Not on file    Minutes per session: Not on file   Stress: Not on file  Relationships   Social connections    Talks on phone: Not on file    Gets together: Not on file    Attends religious service: Not on file    Active member of club or organization: Not on file    Attends meetings of clubs or organizations: Not on file    Relationship status: Not on file  Other Topics Concern   Not on file  Social History Narrative   Not on file     Family History: The patient's family history includes CAD in her father; Cancer - Ovarian in her sister; Colon polyps in her mother; Diabetes in her maternal grandmother and paternal grandfather; Healthy in her son; Heart attack in her father; Heart disease in her paternal grandmother; Hyperlipidemia in her mother; Obesity in her daughter. ROS:   Please see the history of present illness.     All other systems reviewed and are negative.   EKG:  EKG is not ordered today.  Recent Labs: 01/21/2019: BUN 14; Creatinine, Ser 0.77; Hemoglobin 14.4; Platelets 203; Potassium 4.2; Sodium 138   Recent Lipid Panel    Component Value Date/Time   CHOL 179 04/04/2016 1540   TRIG 180 (H) 04/04/2016 1540   HDL 47 04/04/2016 1540   CHOLHDL 3.8 04/04/2016 1540   LDLCALC 96 04/04/2016 1540    Physical Exam:    VS:  BP (!) 150/78    Pulse (!) 55    Ht 5\' 8"  (1.727 m)    Wt 188 lb (85.3 kg)    SpO2 95%    BMI 28.59 kg/m     Wt Readings from Last 6 Encounters:  02/01/19 188 lb (85.3 kg)  01/18/19 185 lb (83.9 kg)  10/30/18 195 lb (88.5 kg)  10/25/18 193 lb (87.5 kg)  07/09/18 202 lb (91.6 kg)  07/05/18 202 lb 9.6 oz (91.9 kg)  Physical Exam  Constitutional: She is oriented to person, place, and time. She appears well-developed and well-nourished. No distress.  HENT:  Head: Normocephalic and  atraumatic.  Neck: Normal range of motion. Neck supple. No JVD present.  Cardiovascular: Normal rate, regular rhythm, normal heart sounds and intact distal pulses. Exam reveals no gallop and no friction rub.  No murmur heard. Pulmonary/Chest: Effort normal and breath sounds normal. No respiratory distress. She has no wheezes. She has no rales.    Chest incision with skin glue, no redness, swelling or drainage  Abdominal: Soft. Bowel sounds are normal.  Musculoskeletal: Normal range of motion.        General: No edema.  Neurological: She is alert and oriented to person, place, and time.  Skin: Skin is warm and dry.  Psychiatric: She has a normal mood and affect. Her behavior is normal. Judgment and thought content normal.  Vitals reviewed.    ASSESSMENT:    1. Coronary artery disease involving native coronary artery of native heart without angina pectoris   2. PAF (paroxysmal atrial fibrillation) ([place])   3. Essential (primary) hypertension   4. Hyperlipidemia LDL goal <70   5. Type 2 diabetes mellitus with other circulatory complication, without long-term current use of insulin (HCC)    PLAN:    In order of problems listed above:  CAD -Left heart cath in 06/2018 with nonobstructive CAD and patent stents.  Her chest pain felt to be related to anxiety. -Medical therapy includes high intensity statin, beta-blocker, ACE inhibitor and Jardiance.  Not on aspirin due to need for anticoagulation. -Pt is quite active with no anginal symptoms.  Paroxysmal atrial fibrillation -Controlled with Multaq.  Maintaining sinus bradycardia.  On Pradaxa for stroke risk reduction. -No unusual bleeding.  Hemoglobin 13.6 on 11/12/2018.  Essential hypertension -On amlodipine 5 mg, carvedilol 6.25 mg twice daily, lisinopril 40 mg. BP is elevated. Pt now notes that she has been out of amlodipine for months. Will restart amlodipine 5 mg. We discussed that this can be further increased to 10 mg if needed.  She may have had some ankle edema with 10 mg in the past. 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  -Labs 11/12/2018 with serum creatinine of 0.8.  Potassium level 4.0.  Hyperlipidemia, goal LDL <70 -On high intensity statin with atorvastatin 80 mg daily and Zetia 10 mg daily -Lipid levels per PCP 11/12/2018: TC 142, HDL 48, LDL 77 steroids 85. -Pt would like to work on diet and exercise to try to get to goal. Consider PCSK9 inhibitor in the future. She is going to think about it.   Diabetes type 2 -On Jardiance and Metformin.  Management per PCP. -A1c 5.6 on 11/12/2018  Possible sleep apnea -She was noted to snore when sedated for cath. She notes that she does snore. Her husband has not heard her stop breathing. She has no daytime sleepiness.  -She is not inclined to pursue sleep study at this time. I advised that her husband watch her sleep and notice if she has any apneic episodes.    Medication Adjustments/Labs and Tests Ordered: Current medicines are reviewed at length with the patient today.  Concerns regarding medicines are outlined above. Labs and tests ordered and medication changes are outlined in the patient instructions below:  Patient Instructions  Medication Instructions:  Your physician recommends that you continue on your current medications as directed. Please refer to the Current Medication list given to you today.  *If you need a  refill on your cardiac medications before your next appointment, please call your pharmacy*  Lab Work: None ordered   If you have labs (blood work) drawn today and your tests are completely normal, you will receive your results only by:  [place] (if you have MyChart) OR  A paper copy in the mail If you have any lab test that is abnormal or we need to change your treatment, we will call you to review the results.  Testing/Procedures: None ordered   Follow-Up: At [facility], you and your health needs are our priority.  As  part of our continuing mission to provide you with exceptional heart care, we have created designated Provider Care Teams.  These Care Teams include your primary Cardiologist (physician) and Advanced Practice Providers (APPs -  Physician Assistants and Nurse Practitioners) who all work together to provide you with the care you need, when you need it.  Your next appointment:   6 month(s)  The format for your next appointment:   In Person  Provider:   You may see Mertie Moores, MD or one of the following Advanced Practice Providers on your designated Care Team:    Richardson Dopp, PA-C  [place], [place]  Daune Perch, NP   Follow up with Dr. Curt Bears in 3 months    Other Instructions  Lifestyle Modifications to Prevent and Treat Heart Disease -Recommend heart healthy/Mediterranean diet, with whole grains, fruits, vegetables, fish, lean meats, nuts, olive oil and avocado oil.  -Limit salt intake to less than 2000 mg per day.  -Recommend moderate walking, starting slowly with a few minutes and working up to 3-5 times/week for 30-50 minutes each session. Aim for at least 150 minutes.week. Goal should be pace of 3 miles/hours, or walking 1.5 miles in 30 minutes -Recommend avoidance of tobacco products. Avoid excess alcohol. -Keep blood pressure well controlled, ideally less than 130/80.   ===========================================   Mediterranean Diet A Mediterranean diet refers to food and lifestyle choices that are based on the traditions of countries located on the [employer]. This way of eating has been shown to help prevent certain conditions and improve outcomes for people who have chronic diseases, like kidney disease and heart disease. What are tips for following this plan? Lifestyle  Cook and eat meals together with your family, when possible.  Drink enough fluid to keep your urine clear or pale yellow.  Be physically active every day. This includes: ? Aerobic  exercise like running or swimming. ? Leisure activities like gardening, walking, or housework.  Get 7-8 hours of sleep each night.  If recommended by your health care provider, drink red wine in moderation. This means 1 glass a day for nonpregnant women and 2 glasses a day for men. A glass of wine equals 5 oz (150 mL). Reading food labels   Check the serving size of packaged foods. For foods such as rice and pasta, the serving size refers to the amount of cooked product, not dry.  Check the total fat in packaged foods. Avoid foods that have saturated fat or trans fats.  Check the ingredients list for added sugars, such as corn syrup. Shopping  At the grocery store, buy most of your food from the areas near the walls of the store. This includes: ? Fresh fruits and vegetables (produce). ? Grains, beans, nuts, and seeds. Some of these may be available in unpackaged forms or large amounts (in bulk). ? Fresh seafood. ? Poultry and eggs. ? Low-fat dairy products.  Buy whole ingredients instead of prepackaged foods.  Buy fresh fruits and vegetables in-season from local farmers markets.  Buy frozen fruits and vegetables in resealable bags.  If you do not have access to quality fresh seafood, buy precooked frozen shrimp or canned fish, such as tuna, salmon, or sardines.  Buy small amounts of raw or cooked vegetables, salads, or olives from the deli or salad bar at your store.  Stock your pantry so you always have certain foods on hand, such as olive oil, canned tuna, canned tomatoes, rice, pasta, and beans. Cooking  Cook foods with extra-virgin olive oil instead of using butter or other vegetable oils.  Have meat as a side dish, and have vegetables or grains as your main dish. This means having meat in small portions or adding small amounts of meat to foods like pasta or stew.  Use beans or vegetables instead of meat in common dishes like chili or lasagna.  Experiment with different  cooking methods. Try roasting or broiling vegetables instead of steaming or sauteing them.  Add frozen vegetables to soups, stews, pasta, or rice.  Add nuts or seeds for added healthy fat at each meal. You can add these to yogurt, salads, or vegetable dishes.  Marinate fish or vegetables using olive oil, lemon juice, garlic, and fresh herbs. Meal planning   Plan to eat 1 vegetarian meal one day each week. Try to work up to 2 vegetarian meals, if possible.  Eat seafood 2 or more times a week.  Have healthy snacks readily available, such as: ? Vegetable sticks with hummus. ? [place] yogurt. ? Fruit and nut trail mix.  Eat balanced meals throughout the week. This includes: ? Fruit: 2-3 servings a day ? Vegetables: 4-5 servings a day ? Low-fat dairy: 2 servings a day ? Fish, poultry, or lean meat: 1 serving a day ? Beans and legumes: 2 or more servings a week ? Nuts and seeds: 1-2 servings a day ? Whole grains: 6-8 servings a day ? Extra-virgin olive oil: 3-4 servings a day  Limit red meat and sweets to only a few servings a month What are my food choices?  Mediterranean diet ? Recommended  Grains: Whole-grain pasta. Brown rice. Bulgar wheat. Polenta. Couscous. Whole-wheat bread. [place].  Vegetables: Artichokes. Beets. Broccoli. Cabbage. Carrots. Eggplant. Green beans. Chard. Kale. Spinach. Onions. Leeks. Peas. Squash. Tomatoes. Peppers. Radishes.  Fruits: Apples. Apricots. Avocado. Berries. Bananas. Cherries. Dates. Figs. Grapes. Lemons. Melon. Oranges. Peaches. Plums. Pomegranate.  Meats and other protein foods: Beans. Almonds. Sunflower seeds. Pine nuts. Peanuts. [place]. Salmon. Scallops. Shrimp. [place]. Tilapia. Clams. Oysters. Eggs.  Dairy: Low-fat milk. Cheese. Greek yogurt.  Beverages: Water. Red wine. Herbal tea.  Fats and oils: Extra virgin olive oil. Avocado oil. Grape seed oil.  Sweets and desserts: [place] yogurt with honey. Baked apples. Poached pears. Trail  mix.  Seasoning and other foods: Basil. Cilantro. Coriander. Cumin. Mint. Parsley. Sage. Rosemary. Tarragon. Garlic. Oregano. Thyme. Pepper. Balsalmic vinegar. Tahini. Hummus. Tomato sauce. Olives. Mushrooms. ? Limit these  Grains: Prepackaged pasta or rice dishes. Prepackaged cereal with added sugar.  Vegetables: Deep fried potatoes (french fries).  Fruits: Fruit canned in syrup.  Meats and other protein foods: Beef. Pork. Lamb. Poultry with skin. Hot dogs. [place] Salines.  Dairy: Ice cream. Sour cream. Whole milk.  Beverages: Juice. Sugar-sweetened soft drinks. Beer. Liquor and spirits.  Fats and oils: Butter. Canola oil. Vegetable oil. Beef fat (tallow). Lard.  Sweets and desserts: Cookies. Cakes. Pies. Candy.  Seasoning  and other foods: Mayonnaise. Premade sauces and marinades. The items listed may not be a complete list. Talk with your dietitian about what dietary choices are right for you. Summary  The Mediterranean diet includes both food and lifestyle choices.  Eat a variety of fresh fruits and vegetables, beans, nuts, seeds, and whole grains.  Limit the amount of red meat and sweets that you eat.  Talk with your health care provider about whether it is safe for you to drink red wine in moderation. This means 1 glass a day for nonpregnant women and 2 glasses a day for men. A glass of wine equals 5 oz (150 mL). This information is not intended to replace advice given to you by your health care provider. Make sure you discuss any questions you have with your health care provider. Document Released: 10/08/2015 Document Revised: 10/15/2015 Document Reviewed: 10/08/2015 Elsevier Patient Education  2020 [place], Daune Perch, NP  02/01/2019 6:35 PM    [place]

## 2019-02-04 ENCOUNTER — Encounter (HOSPITAL_COMMUNITY): Payer: Self-pay | Admitting: General Surgery

## 2019-04-27 ENCOUNTER — Ambulatory Visit: Payer: Commercial Managed Care - PPO | Attending: Internal Medicine

## 2019-04-27 DIAGNOSIS — Z23 Encounter for immunization: Secondary | ICD-10-CM | POA: Insufficient documentation

## 2019-04-27 NOTE — Progress Notes (Signed)
   Covid-19 Vaccination Clinic  Name:  Allecia Koesters    MRN: [id number] DOB: [birth year]  04/27/2019  Ms. Acerra was observed post Covid-19 immunization for 15 minutes without incidence. She was provided with Vaccine Information Sheet and instruction to access the V-Safe system.   Ms. Trost was instructed to call 911 with any severe reactions post vaccine: Marland Kitchen Difficulty breathing  . Swelling of your face and throat  . A fast heartbeat  . A bad rash all over your body  . Dizziness and weakness    Immunizations Administered    Name Date Dose VIS Date Route   Pfizer COVID-19 Vaccine 04/27/2019  9:05 AM 0.3 mL 02/08/2019 Intramuscular   Manufacturer: [place]   Lot: [id number]   [place]: [id number]

## 2019-05-18 ENCOUNTER — Ambulatory Visit: Payer: Commercial Managed Care - PPO | Attending: Internal Medicine

## 2019-05-18 DIAGNOSIS — Z23 Encounter for immunization: Secondary | ICD-10-CM

## 2019-05-18 NOTE — Progress Notes (Signed)
   Covid-19 Vaccination Clinic  Name:  Brittany Buckley    MRN: [id number] DOB: 1955-08-23  05/18/2019  [name] was observed post Covid-19 immunization for 15 minutes without incident. She was provided with Vaccine Information Sheet and instruction to access the V-Safe system.   [name] was instructed to call 911 with any severe reactions post vaccine: Marland Kitchen Difficulty breathing  . Swelling of face and throat  . A fast heartbeat  . A bad rash all over body  . Dizziness and weakness   Immunizations Administered    Name Date Dose VIS Date Route   Pfizer COVID-19 Vaccine 05/18/2019 10:40 AM 0.3 mL 02/08/2019 Intramuscular   Manufacturer: [place]   Lot: [id number]   [place]: [id number]

## 2019-05-22 ENCOUNTER — Ambulatory Visit: Payer: Commercial Managed Care - PPO

## 2019-06-03 ENCOUNTER — Encounter (HOSPITAL_COMMUNITY): Payer: Self-pay

## 2019-06-03 ENCOUNTER — Emergency Department (HOSPITAL_COMMUNITY)
Admission: EM | Admit: 2019-06-03 | Discharge: 2019-06-04 | Disposition: A | Discharge status: Home / Self Care | Payer: Commercial Managed Care - PPO | Attending: Emergency Medicine | Admitting: Emergency Medicine

## 2019-06-03 DIAGNOSIS — E119 Type 2 diabetes mellitus without complications: Secondary | ICD-10-CM | POA: Insufficient documentation

## 2019-06-03 DIAGNOSIS — J45909 Unspecified asthma, uncomplicated: Secondary | ICD-10-CM | POA: Diagnosis not present

## 2019-06-03 DIAGNOSIS — I4891 Unspecified atrial fibrillation: Secondary | ICD-10-CM

## 2019-06-03 DIAGNOSIS — I252 Old myocardial infarction: Secondary | ICD-10-CM | POA: Diagnosis not present

## 2019-06-03 DIAGNOSIS — Z7984 Long term (current) use of oral hypoglycemic drugs: Secondary | ICD-10-CM | POA: Diagnosis not present

## 2019-06-03 DIAGNOSIS — I10 Essential (primary) hypertension: Secondary | ICD-10-CM | POA: Insufficient documentation

## 2019-06-03 DIAGNOSIS — R079 Chest pain, unspecified: Secondary | ICD-10-CM

## 2019-06-03 DIAGNOSIS — R002 Palpitations: Secondary | ICD-10-CM | POA: Diagnosis present

## 2019-06-03 DIAGNOSIS — I2510 Atherosclerotic heart disease of native coronary artery without angina pectoris: Secondary | ICD-10-CM | POA: Diagnosis not present

## 2019-06-03 DIAGNOSIS — Z79899 Other long term (current) drug therapy: Secondary | ICD-10-CM | POA: Insufficient documentation

## 2019-06-03 DIAGNOSIS — Z955 Presence of coronary angioplasty implant and graft: Secondary | ICD-10-CM | POA: Diagnosis not present

## 2019-06-03 HISTORY — DX: Other persistent atrial fibrillation: I48.19

## 2019-06-03 MED ORDER — SODIUM CHLORIDE 0.9% FLUSH: 3.0000 mL | Freq: Once | INTRAVENOUS | Status: DC

## 2019-06-03 NOTE — ED Triage Notes (Signed)
Pt reports that she was moving today and felt she was doing to much then tonight she was having plapations and was in afib, pt took one nitro and multaq without relief. Some SOB

## 2019-06-04 ENCOUNTER — Encounter (HOSPITAL_COMMUNITY): Payer: Self-pay | Admitting: Physician Assistant

## 2019-06-04 ENCOUNTER — Emergency Department (HOSPITAL_COMMUNITY): Payer: Commercial Managed Care - PPO

## 2019-06-04 ENCOUNTER — Other Ambulatory Visit (HOSPITAL_COMMUNITY): Payer: Self-pay

## 2019-06-04 DIAGNOSIS — I4891 Unspecified atrial fibrillation: Secondary | ICD-10-CM

## 2019-06-04 LAB — CBC
HCT: 44.8 % (ref 36.0–46.0)
Hemoglobin: 14.7 g/dL (ref 12.0–15.0)
MCH: 28.5 pg (ref 26.0–34.0)
MCHC: 32.8 g/dL (ref 30.0–36.0)
MCV: 87 fL (ref 80.0–100.0)
Platelets: 218 10*3/uL (ref 150–400)
RBC: 5.15 MIL/uL — ABNORMAL HIGH (ref 3.87–5.11)
RDW: 13.5 % (ref 11.5–15.5)
WBC: 8.1 10*3/uL (ref 4.0–10.5)
nRBC: 0 % (ref 0.0–0.2)

## 2019-06-04 LAB — BASIC METABOLIC PANEL
Anion gap: 12 (ref 5–15)
BUN: 19 mg/dL (ref 8–23)
CO2: 21 mmol/L — ABNORMAL LOW (ref 22–32)
Calcium: 9.1 mg/dL (ref 8.9–10.3)
Chloride: 106 mmol/L (ref 98–111)
Creatinine, Ser: 1.05 mg/dL — ABNORMAL HIGH (ref 0.44–1.00)
GFR calc Af Amer: >60 mL/min (ref >60)
GFR calc non Af Amer: 56 mL/min — ABNORMAL LOW (ref >60)
Glucose, Bld: 236 mg/dL — ABNORMAL HIGH (ref 70–99)
Potassium: 3.3 mmol/L — ABNORMAL LOW (ref 3.5–5.1)
Sodium: 139 mmol/L (ref 135–145)

## 2019-06-04 LAB — TROPONIN I (HIGH SENSITIVITY)
Troponin I (High Sensitivity): 13 ng/L (ref <18)
Troponin I (High Sensitivity): 11 ng/L (ref <18)

## 2019-06-04 LAB — TSH: TSH: 0.797 u[IU]/mL (ref 0.350–4.500)

## 2019-06-04 LAB — MAGNESIUM: Magnesium: 2.1 mg/dL (ref 1.7–2.4)

## 2019-06-04 MED ORDER — DABIGATRAN ETEXILATE MESYLATE 150 MG PO CAPS: 150.0000 mg | ORAL_CAPSULE | Freq: Two times a day (BID) | ORAL | Status: DC

## 2019-06-04 MED ORDER — DABIGATRAN ETEXILATE MESYLATE 150 MG PO CAPS
150.0000 mg | ORAL_CAPSULE | Freq: Two times a day (BID) | ORAL | Status: DC
  Administered 2019-06-04: 150 mg via ORAL
  Filled 2019-06-04 (×2): qty 1

## 2019-06-04 MED ORDER — CARVEDILOL 12.5 MG PO TABS
6.2500 mg | ORAL_TABLET | Freq: Two times a day (BID) | ORAL | Status: DC
  Administered 2019-06-04: 6.25 mg via ORAL
  Filled 2019-06-04: qty 1

## 2019-06-04 MED ORDER — ACETAMINOPHEN 500 MG PO TABS
500.0000 mg | ORAL_TABLET | Freq: Once | ORAL | Status: AC
  Administered 2019-06-04: 500 mg via ORAL
  Filled 2019-06-04: qty 1

## 2019-06-04 MED ORDER — DRONEDARONE HCL 400 MG PO TABS
400.0000 mg | ORAL_TABLET | Freq: Two times a day (BID) | ORAL | Status: DC
  Administered 2019-06-04: 400 mg via ORAL
  Filled 2019-06-04: qty 1

## 2019-06-04 MED ORDER — CARVEDILOL 6.25 MG PO TABS: 6.2500 mg | ORAL_TABLET | Freq: Two times a day (BID) | ORAL | 6 refills | Status: DC

## 2019-06-04 MED ORDER — ATORVASTATIN CALCIUM 80 MG PO TABS: 80.0000 mg | ORAL_TABLET | Freq: Every day | ORAL | 1 refills | Status: DC

## 2019-06-04 MED ORDER — POTASSIUM CHLORIDE CRYS ER 20 MEQ PO TBCR
40.0000 meq | EXTENDED_RELEASE_TABLET | Freq: Once | ORAL | Status: AC
  Administered 2019-06-04: 40 meq via ORAL
  Filled 2019-06-04: qty 2

## 2019-06-04 NOTE — Consult Note (Addendum)
Cardiology Consultation   Patient ID: Brittany Buckley MRN: [id number]; DOB: 12-01-1955   Date of Consult: 06/04/19 Primary Care Provider: Marton Redwood, MD Primary Cardiologist: Mertie Moores, MD  Primary Electrophysiologist:  Will Meredith Leeds, MD   Chief Complaint: palpitations and chest discomfort  Patient Profile:   Brittany Buckley is a 64 y.o. female with CAD with history of MI and prior stenting in ~2014/2015 in [place] (stents in LCx and RCA based on stable cath in 06/2018), persistent atrial fibrillation, ADHD, asthma, chronic neck/back pain, HTN, HLD, gastric ulcer, DM, migraines, obesity whom we are asked to see for afib and chest discomfort at the request of Dr. Sherry Ruffing.  History of Present Illness:   She had prior MI/stenting in [place], [place] before she moved to [facility]. She has had atrial fibrillation for many years requiring periodic cardioversions. In 2018 she was admitted with rapid atrial fibrillation and hypotension but did not hold with cardioversion. She was started on amiodarone drip briefly but the decision was make to try Tikosyn instead. This was aborted due to QT prolongation despite downtitration of Tikosyn. She has since been on Multaq. At one point she was on metoprolol but last note indicated this may have been discontinued previously due to bradycardia. She had been on carvedilol 6.25mg  BID but is now on 12.5mg  once daily for unclear reasons (med change not made through our office). She has a history of intermittent exertional angina, which she states is worse when she is under stress. 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 Repeat cath in 06/2018 for chest pain showing no significant new blockages, patent Cx and RCA, mild-moderate irregularities less than or equal to 50% in mid-to-distal right and in mid-circumflex. Medical therapy was recommended. Around that time she was having episodes of CP waking her from sleep but that has resolved. She was  noted to have snoring after sedation so sleep study was recommended but she missed her appointment due to bad weather that day.   She has been fairly busy recently downsizing their home and moving boxes. As above she will periodically notice exertional chest discomfort/tightness and dyspnea if she pushes herself but says this tends to happen primarily when she is also under emotional stress as well. It does not happen every time she exerts herself. She also has asthma. She has been compliant with Multaq and Pradaxa but has not been taking metformin and atorvastatin recently. Last night around 10pm while moving a shoebox she felt onset of palpitations and chest burning with some dyspnea. No nausea, vomiting or diaphoresis. She took her Multaq and a SL NTG without relief of symptoms so came to the ED. She was found to be in rapid atrial fibrillation in the 1teens. She spontaneously converted this morning around 10am per report, which also resolved her chest discomfort symptoms. She has felt great for several hours while being observed in the ED and requests to go home. In general she has done very well on Multaq with last episode of palpitaitons over a month ago which was only brief.  Labs show hypokalemia of 3.3, BUN 19, Cr 1.05, magnesium 2.1, hsTroponin 13->11, normal Hgb, normal TSH. CXR NAD. She is mildly hypertensive but afebrile with normal O2 sat. EKG did show subtle diffuse ST depression while in AF RVR. These ST changes resolved in NSR although QTc was borderline at 518ms. Repeat tracing showed QTc improving to 48ms. Previous outpatient QTc in 2020 was 467ms.   Past Medical History:  Diagnosis  Date  . Adult ADHD   . Anxiety   . Arthritis    "hands, feet" (06/14/2016)  . Asthma   . Chronic lower back pain   . Chronic neck pain   . Coronary artery disease    a. history of MI and prior stenting in ~2014/2015 in [place] (stents in LCx and RCA based on stable cath in 06/2018).  . Gastric ulcer    . GERD (gastroesophageal reflux disease)   . Headache    "monthly" (06/14/2016)  . Heart murmur   . History of blood transfusion 1983   Bleed after C-Section  . Hyperlipidemia   . Hypertension   . MI (myocardial infarction) ([place]) 2014; 2015  . Migraine    "probably twice/year" (06/14/2016)  . Numbness of toes   . Obesity   . On anticoagulant therapy   . Peripheral neuropathy   . Persistent atrial fibrillation ([place])   . Pneumonia 1990s  . Thyroid nodule   . Type II diabetes mellitus ([place])   . Urticaria     Past Surgical History:  Procedure Laterality Date  . ABDOMINAL HYSTERECTOMY    . BIOPSY THYROID    . BREAST CYST EXCISION Left 01/21/2019   Procedure: EXCISION OF LEFT BREAST MASS;  Surgeon: Jovita Kussmaul, MD;  Location: [facility];  Service: General;  Laterality: Left;  . BREAST EXCISIONAL BIOPSY Right   . CESAREAN SECTION    . CORONARY ANGIOPLASTY WITH STENT PLACEMENT  2014 & 2015  . LEFT HEART CATH AND CORONARY ANGIOGRAPHY N/A 07/09/2018   Procedure: LEFT HEART CATH AND CORONARY ANGIOGRAPHY;  Surgeon: Belva Crome, MD;  Location: [facility];  Service: Cardiovascular;  Laterality: N/A;  . TONSILLECTOMY AND ADENOIDECTOMY    . TRIGGER FINGER RELEASE Right    "pointer"     Medications Prior to Admission: Prior to Admission medications   Medication Sig Start Date End Date Taking? Authorizing Provider  acetaminophen (TYLENOL) 650 MG CR tablet Take 1,300 mg by mouth every 8 (eight) hours as needed for pain.   Yes [provider]  albuterol (PROVENTIL HFA;VENTOLIN HFA) 108 (90 Base) MCG/ACT inhaler Inhale 1-2 puffs into the lungs every 6 (six) hours as needed for wheezing or shortness of breath.   Yes [provider]  amLODipine (NORVASC) 5 MG tablet Take 1 tablet (5 mg total) by mouth daily. 02/01/19  Yes Daune Perch, NP  carvedilol (COREG) 12.5 MG tablet Take 12.5 mg by mouth daily.  02/15/19  Yes [provider]  dabigatran (PRADAXA) 150  MG CAPS capsule Take 150 mg by mouth 2 (two) times daily.   Yes [provider]  diphenhydrAMINE (BENADRYL) 25 MG tablet Take 25 mg by mouth every 6 (six) hours as needed for itching or allergies.    Yes [provider]  dronedarone (MULTAQ) 400 MG tablet Take 1 tablet (400 mg total) by mouth 2 (two) times daily with a meal. 02/01/19  Yes Daune Perch, NP  JARDIANCE 25 MG TABS tablet Take 25 mg by mouth daily. 03/05/18  Yes [provider]  lisinopril (PRINIVIL,ZESTRIL) 40 MG tablet Take 40 mg by mouth daily.   Yes [provider]  nitroGLYCERIN (NITROSTAT) 0.4 MG SL tablet Place 0.4 mg under the tongue every 5 (five) minutes as needed for chest pain.   Yes [provider]  omeprazole (PRILOSEC) 40 MG capsule Take 1 capsule (40 mg total) by mouth 2 (two) times daily. Patient taking differently: Take 40 mg by mouth  daily.  12/18/18  Yes Mansouraty, Telford Nab., MD  Semaglutide, 1 MG/DOSE, (OZEMPIC, 1 MG/DOSE,) 2 MG/1.5ML SOPN Inject 1 mg into the skin once a week. Saturday   Yes [provider]  atorvastatin (LIPITOR) 80 MG tablet Take 80 mg by mouth daily.    [provider]  carvedilol (COREG) 6.25 MG tablet Take 1 tablet (6.25 mg total) by mouth 2 (two) times daily. Patient not taking: Reported on 06/04/2019 07/02/18   Lyda Jester M, PA-C  hydrocortisone cream 1 % Apply topically as needed for itching. Patient not taking: Reported on 06/04/2019 06/17/16   Baldwin Jamaica, PA-C  metFORMIN (GLUCOPHAGE) 1000 MG tablet Take 1,000 mg by mouth daily with breakfast.     [provider]  mupirocin ointment (BACTROBAN) 2 % Apply 1 application topically 2 (two) times daily. Patient not taking: Reported on 06/04/2019 11/26/18   Trula Slade, DPM  traMADol (ULTRAM) 50 MG tablet Take 1-2 tablets (50-100 mg total) by mouth every 6 (six) hours as needed. Patient not taking: Reported on 06/04/2019 01/21/19   Jovita Kussmaul, MD      Allergies:    Allergies  Allergen Reactions  . Codeine Hives    ONLY IN COUGH SYRUP  . Onion Other (See Comments)    Runny nose and chest/nasal congestion (IF RAW)  . Tomato Other (See Comments)    Runny nose and chest/nasal congestion (IF RAW)  . Chocolate Rash  . Demerol [Meperidine] Rash    Social History:   Social History   Socioeconomic History  . Marital status: Married    Spouse name: Not on file  . Number of children: Not on file  . Years of education: Not on file  . Highest education level: Not on file  Occupational History  . Not on file  Tobacco Use  . Smoking status: Never Smoker  . Smokeless tobacco: Never Used  Substance and Sexual Activity  . Alcohol use: Not Currently  . Drug use: No  . Sexual activity: Yes    Comment: married  Other Topics Concern  . Not on file  Social History Narrative  . Not on file   Social Determinants of Health   Financial Resource Strain:   . Difficulty of Paying Living Expenses:   Food Insecurity:   . Worried About Charity fundraiser in the Last Year:   . Arboriculturist in the Last Year:   Transportation Needs:   . Film/video editor (Medical):   Marland Kitchen Lack of Transportation (Non-Medical):   Physical Activity:   . Days of Exercise per Week:   . Minutes of Exercise per Session:   Stress:   . Feeling of Stress :   Social Connections:   . Frequency of Communication with Friends and Family:   . Frequency of Social Gatherings with Friends and Family:   . Attends Religious Services:   . Active Member of Clubs or Organizations:   . Attends Archivist Meetings:   Marland Kitchen Marital Status:   Intimate Partner Violence:   . Fear of Current or Ex-Partner:   . Emotionally Abused:   Marland Kitchen Physically Abused:   . Sexually Abused:     Family History:   The patient's family history includes CAD in her father; Cancer - Ovarian in her sister; Colon polyps in her mother; Diabetes in her maternal grandmother and paternal  grandfather; Healthy in her son; Heart attack in her father; Heart disease in her paternal grandmother; Hyperlipidemia  in her mother; Obesity in her daughter.    ROS:  Please see the history of present illness.  All other ROS reviewed and negative.     Physical Exam/Data:   Vital Signs. BP (!) 155/71 (BP Location: Right Arm)   Pulse 69   Temp (!) 97.5 F (36.4 C) (Oral)   Resp 16   SpO2 97%  General: Well developed, well nourished WF in no acute distress. Head: Normocephalic, atraumatic, sclera non-icteric, no xanthomas, nares are without discharge. Neck: Negative for carotid bruits. JVP not elevated. Lungs: Clear bilaterally to auscultation without wheezes, rales, or rhonchi. Breathing is unlabored. Heart: RRR S1 S2 without murmurs, rubs, or gallops.  Abdomen: Soft, non-tender, non-distended with normoactive bowel sounds. No rebound/guarding. Extremities: No clubbing or cyanosis. No edema. Distal pedal pulses are 2+ and equal bilaterally. Neuro: Alert and oriented X 3. Moves all extremities spontaneously. Psych:  Responds to questions appropriately with a normal affect.  EKG:  Personally reviewed from today: 06/03/19: atrial fib RVR 114bpm, ST depression inferiorly and V4-V6, QTc reported at 562ms 06/04/19: NSR 69bpm, nonspecific TW changes, QTc 556ms  Relevant CV Studies:  [facility] 06/2018 Conclusion   Normal systolic function with EF 65% or greater, and moderate to severe elevation of LVEDP consistent with diastolic heart failure  Short, widely patent left main.  Large wraparound LAD with mid vessel luminal irregularities less than 30%.  Circumflex containing mid to distal vessel 50% narrowing within which a previously placed stent is patent and contains 30% diffuse in-stent restenosis.  Dominant right coronary with segmental 50 to 60% mid stenosis.  Patent mid to distal stent with 50% or less diffuse in-stent restenosis.  Beyond the stent there is relatively focal eccentric 60 to  70% stenosis.  PDA contains mid 70% stenosis.  RECOMMENDATIONS:   No angiographically severe stenoses are noted.  When correlated with the patient's symptoms, no lesions that would cause pain at rest are identified.  Therefore, it is potentially possible that symptoms are nonischemic.  If ischemia is responsible, a vasomotor component would need to be employed as a mechanism.  Aggressive secondary risk factor modification: High intensity statin therapy and consider PCSK-9 given high risk, A1c less than 7..  Sleep study to confirm the presence of obstructive sleep apnea which is suspected based upon snoring after sedation.  Resume Pradaxa in 12 hours   2D Echo 03/2016 - Left ventricle: The cavity size was normal. There was mild focal  basal hypertrophy of the septum. Systolic function was normal.  The estimated ejection fraction was in the range of 60% to 65%.  Wall motion was normal; there were no regional wall motion  abnormalities. Doppler parameters are consistent with abnormal  left ventricular relaxation (grade 1 diastolic dysfunction).  - Aortic valve: Trileaflet; mildly thickened, mildly calcified  leaflets.  - Left atrium: The atrium was mildly dilated. Volume/bsa, ES, (1-plane Simpson&'s, A2C): 38.1 ml/m^2.   Laboratory Data:  High Sensitivity Troponin:   Recent Labs  Lab 06/03/19 2359 06/04/19 0747  TROPONINIHS 13 11      Chemistry Recent Labs  Lab 06/03/19 2359  NA 139  K 3.3*  CL 106  CO2 21*  GLUCOSE 236*  BUN 19  CREATININE 1.05*  CALCIUM 9.1  GFRNONAA 56*  GFRAA >60  ANIONGAP 12    No results for input(s): PROT, ALBUMIN, AST, ALT, ALKPHOS, BILITOT in the last 168 hours. Hematology Recent Labs  Lab 06/03/19 2359  WBC 8.1  RBC 5.15*  HGB 14.7  HCT 44.8  MCV 87.0  MCH 28.5  MCHC 32.8  RDW 13.5  PLT 218   BNPNo results for input(s): BNP, PROBNP in the last 168 hours.  DDimer No results for input(s): DDIMER in the last 168  hours.   Radiology/Studies:  DG Chest 2 View  Result Date: 06/04/2019 CLINICAL DATA:  Chest pain. EXAM: CHEST - 2 VIEW COMPARISON:  06/14/2016 FINDINGS: The heart size and mediastinal contours are within normal limits. Both lungs are clear. The visualized skeletal structures are unremarkable. The lungs are slightly hyperexpanded. IMPRESSION: No active cardiopulmonary disease. Electronically Signed   By: Constance Holster M.D.   On: 06/04/2019 00:29   If the patient is being seen for chest pain, [place], NSTEMI or STEMI Press F2 to calculate a risk score         :[id number]    HEAR Score (for undifferentiated chest pain):  HEAR Score: 5    Assessment and Plan:   1. Recurrent atrial fib with RVR - patient has largely done well on Multaq until last night when she had approximately 12 hours of breakthrough atrial fibrillation associated with chest discomfort. She has converted back to NSR without acute intervention and feels much better. She is also found to have an spontaneously low potassium level which we will replete. She has been taking her carvedilol all in one 12.5mg  dose daily. QTc was initially slightly prolonged but has improved to 436ms. Per Dr. Antionette Char recommendation will continue Multaq at present dose, split carvedilol back to 6.25mg  BID, and arrange close EP f/u in the office to trend symptoms and QT interval. Multaq is known to have some cross reactivity with carvedilol, amlodipine and atorvastatin but she otherwise has done well on this combination so continue close monitoring as outpatient. CHADSVASC 4 for HTN, DM, vascular disease and female so will plan to continue anticoagulation. We have also requested she get her carvedilol, Multaq and Pradaxa prior to discharge from the ED so as not to get too far off schedule.  2. Chest pain in the context of Afib & prior history of CAD - hsTroponins are negative x 2. EKG did show diffuse ST depression in context of AF-RVR but hsTroponins are  negative x2 and reassuring. She does report chronic stable angina without acute change otherwise lately (tends to be related to anxiety per patient). This chest pain seemed specifically temporally related to recurrence of atrial fibrillation. The patient wishes to go home. Per discussion with Dr. Burt Knack, given normal troponin levels and resolution of chest pain, no acute inpatient workup is felt necessary but will arrange close follow-up in the office. Patient knows to seek care for recurrent symptoms. I also sent in refill of atorvastatin per her request. As above, there is cross reactivity between Multaq and atorvastatin due to enzyme activity but she has not had any new signs of myopathy. This should be reviewed at each usual office visit.  3. Prolonged QT interval - plan as outlined above. Replete potassium. Monitor closely as outpatient.  4. Hypokalemia - will give 20meq KCl now. May have been reactive to acute arrhythmia as she has had no overt losses otherwise. Prior outpatient levels have been normal. Consider rechecking in the office when she returns for follow-up.  5. HTN - blood pressure running slightly elevated in ED but has not had AM meds yet. Continue to monitor in context of above.  6. History of snoring - will send message to office to help reschedule outpatient sleep study as this  could impact her afib burden.   Dispo: plan discharge from ED. Sent message to office to arrange EP follow-up (or afib clinic) within a few days of discharge to trend QTc and also general cardiology follow-up within 2-3 weeks. Office will call patient with this information.  For questions or updates, please contact [place] Please consult [email or web address] for contact info under    Signed, Charlie Pitter, PA-C  06/04/2019 1:18 PM   Patient seen, examined. Available data reviewed. Agree with findings, assessment, and plan as outlined by Melina Copa, PA-C.  On exam, the patient is alert, oriented, in no  distress.  Lungs are clear bilaterally, carotid upstrokes normal without bruits, heart is regular rate and rhythm with a 2/6 early peaking systolic ejection murmur at the right upper sternal border.  Abdomen is soft and nontender.  Extremities have no edema.  EKG initially showed atrial fibrillation with RVR with heart rate 114 bpm.  Follow-up EKG shows normal sinus rhythm.  The patient presents with symptomatic atrial fibrillation with RVR and has spontaneously converted back to sinus rhythm.  We reviewed her past antiarrhythmic drug history as she has had QT prolongation with dofetilide and has actually tolerated Multaq quite well now for the past few years.  I think she is now stable for hospital discharge with 2 sets of normal high-sensitivity troponins and no chest pain.  She will resume carvedilol 6.25 mg twice daily and I advised her that she can take an extra carvedilol for symptomatic palpitations as needed.  She will continue Multaq.  We will arrange follow-up with Dr. Curt Bears.  We had some initial discussion about the possibility of atrial fib ablation.  She will discuss further with Dr. Curt Bears.  Other issues as outlined above and I agree with the plan as documented.  Sherren Mocha, M.D. 06/04/2019 2:46 PM

## 2019-06-04 NOTE — ED Notes (Signed)
Patient is resting comfortably. 

## 2019-06-04 NOTE — Discharge Instructions (Addendum)
Your history and examination today was consistent with recurrent atrial fibrillation with episodes of the RVR pattern causing the palpitations and discomfort.  Your potassium was low which was replaced in the emergency department today.  We had the cardiology team see you and they feel now that you are back in sinus rhythm, you are safe for discharge with close follow-up with the electrophysiology team and your cardiologist.  They tell me they are switching your beta-blocker to 6.5 twice a day instead of the 12.5.  Please rest and stay hydrated and follow-up as directed.  If any symptoms change or worsen acutely, please return to nearest emergency department immediately.Marland Kitchen
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
## 2019-06-04 NOTE — ED Notes (Signed)
Patient given discharge instructions. Questions were answered. Patient verbalized understanding of discharge instructions and care at home.  

## 2019-06-06 NOTE — Progress Notes (Signed)
Cardiology Office Note Date:  06/07/2019  Patient ID:  Brittany Buckley, DOB [birth year], MRN [id number] PCP:  Marton Redwood, MD  Cardiologist:  Dr. Acie Fredrickson EP: Dr. Curt Bears    Chief Complaint: ER f/u  History of Present Illness: Brittany Buckley is a 64 y.o. female with history of CAD, prior MI (prior stenting in ~2014/2015 in [place] (stents in LCx and RCA based on stable cath in 06/2018), ADHD, asthma, chronic neck/back pain, persistent AFib, HTN, HLD, gastric ulcer, migraines, obesity, DM.  06/04/19 she went t the ER with sudden onset of palpitations, associated with chest burning, SOB.  She arrived in AFib RVR had spontaneous conversion and with this resolution of her symptoms. LABS K+ 3.3 BUN/Creat 19/1.05 Mag 2.1 HS Trop 13, 11  She was given K+, QT felt to be borderline with plans for outpt follow  up with repeat lab as well, noted h/o snoring with plans to facilitate sleep study. For unclear reasons was only taking her Coreg daily, recommended to resume BID and given instructions that she could take an extra PRN for palpitations. Recommended to perhaps discuss ablation with Dr. Curt Bears.  She has not had further AFib since the ER She has had a persistent, headache though. Described as low and dull. Tylenol helps, has some features similar to her migraines, though not exactly.  No associated visual changes focal neuro symptoms.    She feels like much of what is happening of late is directly related to significant personal stress.  She is moving/downsiziong and this process has been more stressful then anticipated. She is working on her Doctorate degree, her dissertation is in boxes and this is very stressful for her as well.  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  The day of her ER visit she was carrying a box(not particularly heavy) but felt her heart rate suddenly change, was fast, made her feel weak, handed off the box to lay down, HR continued, despite taking her  Multaq dose early gave her a heaviness/tightness in her chest took a NTG, without relief and they went to the ER. This was particularly awful, waiting 16hours there. Though she states understanding seeing much sicker people then she, was still a very long, awful day. She could tell her heart rate went back to normal and not immediately though some time after did feel much better.  She mentions though a heaviness to her chest that is always there, constant, nagging, and different then her angina.  She feels very brief/fleeting palpitations most days,  Infrequently anything that is persistent like the other day.  She feels like her AFib is the best control she has had with the Multaq, it is expensive but feels is working well for her.   AFib Hx Goes as far back as her Epic record (a few years)  AAD 2018 Amiodarone briefly during hospital stay for RVR 2018 Tiksoyn  >> failed with QT prolongation >> Multaq 2018 Multaq is current   Past Medical History:  Diagnosis Date  . Adult ADHD   . Anxiety   . Arthritis    "hands, feet" (06/14/2016)  . Asthma   . Chronic lower back pain   . Chronic neck pain   . Coronary artery disease    a. history of MI and prior stenting in ~2014/2015 in [place] (stents in LCx and RCA based on stable cath in 06/2018).  . Gastric ulcer   . GERD (gastroesophageal reflux disease)   . Headache    "  monthly" (06/14/2016)  . Heart murmur   . History of blood transfusion 1983   Bleed after C-Section  . Hyperlipidemia   . Hypertension   . MI (myocardial infarction) ([place]) 2014; 2015  . Migraine    "probably twice/year" (06/14/2016)  . Numbness of toes   . Obesity   . On anticoagulant therapy   . Peripheral neuropathy   . Persistent atrial fibrillation ([place])   . Pneumonia 1990s  . Thyroid nodule   . Type II diabetes mellitus ([place])   . Urticaria     Past Surgical History:  Procedure Laterality Date  . ABDOMINAL HYSTERECTOMY    . BIOPSY THYROID    .  BREAST CYST EXCISION Left 01/21/2019   Procedure: EXCISION OF LEFT BREAST MASS;  Surgeon: Jovita Kussmaul, MD;  Location: [facility];  Service: General;  Laterality: Left;  . BREAST EXCISIONAL BIOPSY Right   . CESAREAN SECTION    . CORONARY ANGIOPLASTY WITH STENT PLACEMENT  2014 & 2015  . LEFT HEART CATH AND CORONARY ANGIOGRAPHY N/A 07/09/2018   Procedure: LEFT HEART CATH AND CORONARY ANGIOGRAPHY;  Surgeon: Belva Crome, MD;  Location: [facility];  Service: Cardiovascular;  Laterality: N/A;  . TONSILLECTOMY AND ADENOIDECTOMY    . TRIGGER FINGER RELEASE Right    "pointer"    Current Outpatient Medications  Medication Sig Dispense Refill  . acetaminophen (TYLENOL) 650 MG CR tablet Take 1,300 mg by mouth every 8 (eight) hours as needed for pain.    Marland Kitchen albuterol (PROVENTIL HFA;VENTOLIN HFA) 108 (90 Base) MCG/ACT inhaler Inhale 1-2 puffs into the lungs every 6 (six) hours as needed for wheezing or shortness of breath.    Marland Kitchen amLODipine (NORVASC) 5 MG tablet Take 1 tablet (5 mg total) by mouth daily. 90 tablet 3  . atorvastatin (LIPITOR) 80 MG tablet Take 1 tablet (80 mg total) by mouth daily. 90 tablet 1  . carvedilol (COREG) 6.25 MG tablet Take 1 tablet (6.25 mg total) by mouth 2 (two) times daily with a meal. 60 tablet 6  . dabigatran (PRADAXA) 150 MG CAPS capsule Take 150 mg by mouth 2 (two) times daily.    . diphenhydrAMINE (BENADRYL) 25 MG tablet Take 25 mg by mouth every 6 (six) hours as needed for itching or allergies.     Marland Kitchen dronedarone (MULTAQ) 400 MG tablet Take 1 tablet (400 mg total) by mouth 2 (two) times daily with a meal. 180 tablet 3  . hydrocortisone cream 1 % Apply topically as needed for itching. 30 g 0  . JARDIANCE 25 MG TABS tablet Take 25 mg by mouth daily.    Marland Kitchen lisinopril (PRINIVIL,ZESTRIL) 40 MG tablet Take 40 mg by mouth daily.    . metFORMIN (GLUCOPHAGE) 1000 MG tablet Take 1,000 mg by mouth daily with breakfast.     . mupirocin ointment (BACTROBAN) 2 % Apply 1  application topically 2 (two) times daily. 30 g 2  . nitroGLYCERIN (NITROSTAT) 0.4 MG SL tablet Place 0.4 mg under the tongue every 5 (five) minutes as needed for chest pain.    Marland Kitchen omeprazole (PRILOSEC) 40 MG capsule Take 1 capsule (40 mg total) by mouth 2 (two) times daily. (Patient taking differently: Take 40 mg by mouth daily. ) 60 capsule 2  . Semaglutide, 1 MG/DOSE, (OZEMPIC, 1 MG/DOSE,) 2 MG/1.5ML SOPN Inject 1 mg into the skin once a week. Saturday    . traMADol (ULTRAM) 50 MG tablet Take 1-2 tablets (50-100 mg total) by mouth every  6 (six) hours as needed. 15 tablet 0   No current facility-administered medications for this visit.    Allergies:   Codeine, Onion, Tomato, Chocolate, and Demerol [meperidine]   Social History:  The patient  reports that she has never smoked. She has never used smokeless tobacco. She reports previous alcohol use. She reports that she does not use drugs.   Family History:  The patient's family history includes CAD in her father; Cancer - Ovarian in her sister; Colon polyps in her mother; Diabetes in her maternal grandmother and paternal grandfather; Healthy in her son; Heart attack in her father; Heart disease in her paternal grandmother; Hyperlipidemia in her mother; Obesity in her daughter.  ROS:  Please see the history of present illness. All other systems are reviewed and otherwise negative.   PHYSICAL EXAM:  VS:  BP 126/68   Pulse 64   Ht 5\' 8"  (1.727 m)   Wt 200 lb (90.7 kg)   BMI 30.41 kg/m  BMI: Body mass index is 30.41 kg/m. Well nourished, well developed, in no acute distress  HEENT: normocephalic, atraumatic  Neck: no JVD, carotid bruits or masses Cardiac:  RRR; 1-2/6SM, no rubs, or gallops Lungs:  CTA b/l, no wheezing, rhonchi or rales  Abd: soft, nontender MS: no deformity or atrophy Ext: no edema  Skin: warm and dry, no rash Neuro:  No gross deficits appreciated Psych: euthymic mood, full affect    EKG:  Done today and reviewed  by myself: SR 64bpm, manually measure QT is 463ms, Qtc 441ms   [facility] 06/2018 Conclusion  Normal systolic function with EF 65% or greater, and moderate to severe elevation of LVEDP consistent with diastolic heart failure  Short, widely patent left main.  Large wraparound LAD with mid vessel luminal irregularities less than 30%.  Circumflex containing mid to distal vessel 50% narrowing within which a previously placed stent is patent and contains 30% diffuse in-stent restenosis.  Dominant right coronary with segmental 50 to 60% mid stenosis. Patent mid to distal stent with 50% or less diffuse in-stent restenosis. Beyond the stent there is relatively focal eccentric 60 to 70% stenosis. PDA contains mid 70% stenosis.  RECOMMENDATIONS:   No angiographically severe stenoses are noted. When correlated with the patient's symptoms, no lesions that would cause pain at rest are identified. Therefore, it is potentially possible that symptoms are nonischemic. If ischemia is responsible, a vasomotor component would need to be employed as a mechanism.  Aggressive secondary risk factor modification: High intensity statin therapy and consider PCSK-9 given high risk, A1c less than 7..  Sleep study to confirm the presence of obstructive sleep apnea which is suspected based upon snoring after sedation.  Resume Pradaxa in 12 hours   2D Echo 03/2016 - Left ventricle: The cavity size was normal. There was mild focal  basal hypertrophy of the septum. Systolic function was normal.  The estimated ejection fraction was in the range of 60% to 65%.  Wall motion was normal; there were no regional wall motion  abnormalities. Doppler parameters are consistent with abnormal  left ventricular relaxation (grade 1 diastolic dysfunction).  - Aortic valve: Trileaflet; mildly thickened, mildly calcified  leaflets.  - Left atrium: The atrium was mildly dilated. Volume/bsa, ES, (1-plane Simpson&'s,  A2C): 38.1 ml/m^2.     Recent Labs: 06/03/2019: BUN 19; Creatinine, Ser 1.05; Hemoglobin 14.7; Platelets 218; Potassium 3.3; Sodium 139 06/04/2019: Magnesium 2.1; TSH 0.797  No results found for requested labs within last 8760 hours.  Estimated Creatinine Clearance: 64.6 mL/min (A) (by C-G formula based on SCr of 1.05 mg/dL (H)).   Wt Readings from Last 3 Encounters:  06/07/19 200 lb (90.7 kg)  02/01/19 188 lb (85.3 kg)  01/18/19 185 lb (83.9 kg)     Other studies reviewed: Additional studies/records reviewed today include: summarized above  ASSESSMENT AND PLAN:  1. Paroxysmal AFib     CHA2Ds2Vasc is 4, on Pradaxa, appropriately dosed     On Multaq     QT looks ok today     F/u BMET on her hypokalemia      Discussed potential ablation though she is not unhappy with her AFib burdenl currently Will have her get established with the AFib clinic, see them in a month, hopefully her personal stresses will start to minimize  Continue BID coreg, unclear why was reduced to daily.  Dr. Curt Bears in 36mo, sooner if needed   2. CAD     No anginal sounding symptoms     She has some chest wall tenderness today that does seem to change the aching in her chest some.      Neg HS trop with the RVR      Stable CAD by cath <1 year ago      Keep follow up with Dr. Acie Fredrickson as scheduled  3. HTN     Looks ok  4. HLD     Not addressed today  5. Headache     Gets relief from Tylenol     Low grade and dull, feels better with eyes closed and relaxing and w/tylenol     She has h/o migraines, perhaps triggeered by the NTG     No trauma     F/u with her PMD if not improved or any escalation to seek attention   6. Systolic murmur on exam     Will update her echo  Disposition: as scheduled  Current medicines are reviewed at length with the patient today.  The patient did not have any concerns regarding medicines.  Venetia Night, PA-C 06/07/2019 9:45 AM     [facility] [address] [place] [address] [phone] (office)  [phone] (fax)

## 2019-06-07 ENCOUNTER — Other Ambulatory Visit: Payer: Self-pay

## 2019-06-07 ENCOUNTER — Ambulatory Visit (INDEPENDENT_AMBULATORY_CARE_PROVIDER_SITE_OTHER): Payer: Commercial Managed Care - PPO | Admitting: Physician Assistant

## 2019-06-07 VITALS — BP 126/68 | HR 64 | Ht 68.0 in | Wt 200.0 lb

## 2019-06-07 DIAGNOSIS — R011 Cardiac murmur, unspecified: Secondary | ICD-10-CM

## 2019-06-07 DIAGNOSIS — I480 Paroxysmal atrial fibrillation: Secondary | ICD-10-CM

## 2019-06-07 DIAGNOSIS — I2510 Atherosclerotic heart disease of native coronary artery without angina pectoris: Secondary | ICD-10-CM | POA: Diagnosis not present

## 2019-06-07 DIAGNOSIS — I10 Essential (primary) hypertension: Secondary | ICD-10-CM

## 2019-06-07 LAB — BASIC METABOLIC PANEL
BUN/Creatinine Ratio: 29 — ABNORMAL HIGH (ref 12–28)
BUN: 22 mg/dL (ref 8–27)
CO2: 20 mmol/L (ref 20–29)
Calcium: 8.8 mg/dL (ref 8.7–10.3)
Chloride: 108 mmol/L — ABNORMAL HIGH (ref 96–106)
Creatinine, Ser: 0.77 mg/dL (ref 0.57–1.00)
GFR calc Af Amer: 95 mL/min/{1.73_m2} (ref >59)
GFR calc non Af Amer: 82 mL/min/{1.73_m2} (ref >59)
Glucose: 218 mg/dL — ABNORMAL HIGH (ref 65–99)
Potassium: 4.3 mmol/L (ref 3.5–5.2)
Sodium: 141 mmol/L (ref 134–144)

## 2019-06-07 NOTE — Patient Instructions (Signed)
Medication Instructions:   Your physician recommends that you continue on your current medications as directed. Please refer to the Current Medication list given to you today.  *If you need a refill on your cardiac medications before your next appointment, please call your pharmacy*   Lab Work:  BMET TODAY   If you have labs (blood work) drawn today and your tests are completely normal, you will receive your results only by: Marland Kitchen MyChart Message (if you have MyChart) OR . A paper copy in the mail If you have any lab test that is abnormal or we need to change your treatment, we will call you to review the results.   Testing/Procedures: Your physician has requested that you have an echocardiogram. Echocardiography is a painless test that uses sound waves to create images of your heart. It provides your doctor with information about the size and shape of your heart and how well your heart's chambers and valves are working. This procedure takes approximately one hour. There are no restrictions for this procedure.    Follow-Up: At [facility], you and your health needs are our priority.  As part of our continuing mission to provide you with exceptional heart care, we have created designated Provider Care Teams.  These Care Teams include your primary Cardiologist (physician) and Advanced Practice Providers (APPs -  Physician Assistants and Nurse Practitioners) who all work together to provide you with the care you need, when you need it.  We recommend signing up for the patient portal called "MyChart".  Sign up information is provided on this After Visit Summary.  MyChart is used to connect with patients for Virtual Visits (Telemedicine).  Patients are able to view lab/test results, encounter notes, upcoming appointments, etc.  Non-urgent messages can be sent to your provider as well.   To learn more about what you can do with MyChart, go to [email or web address].    Your next appointment:      Other Instructions

## 2019-06-19 ENCOUNTER — Other Ambulatory Visit: Payer: Self-pay

## 2019-06-19 ENCOUNTER — Encounter: Payer: Self-pay | Admitting: Cardiovascular Disease

## 2019-06-19 ENCOUNTER — Ambulatory Visit (INDEPENDENT_AMBULATORY_CARE_PROVIDER_SITE_OTHER): Payer: Commercial Managed Care - PPO | Admitting: Cardiovascular Disease

## 2019-06-19 VITALS — BP 136/78 | HR 50 | Ht 68.0 in | Wt 194.8 lb

## 2019-06-19 DIAGNOSIS — R011 Cardiac murmur, unspecified: Secondary | ICD-10-CM

## 2019-06-19 DIAGNOSIS — I2510 Atherosclerotic heart disease of native coronary artery without angina pectoris: Secondary | ICD-10-CM | POA: Diagnosis not present

## 2019-06-19 DIAGNOSIS — I209 Angina pectoris, unspecified: Secondary | ICD-10-CM

## 2019-06-19 MED ORDER — NITROGLYCERIN 0.4 MG SL SUBL: 0.4000 mg | SUBLINGUAL_TABLET | SUBLINGUAL | 3 refills | Status: DC | PRN

## 2019-06-19 MED ORDER — ISOSORBIDE MONONITRATE ER 30 MG PO TB24: 30.0000 mg | ORAL_TABLET | Freq: Every day | ORAL | 3 refills | Status: DC

## 2019-06-19 NOTE — Patient Instructions (Signed)
Medication Instructions:  1) START Imdur 30mg  once daily  *If you need a refill on your cardiac medications before your next appointment, please call your pharmacy*   Lab Work: None If you have labs (blood work) drawn today and your tests are completely normal, you will receive your results only by: Marland Kitchen MyChart Message (if you have MyChart) OR . A paper copy in the mail If you have any lab test that is abnormal or we need to change your treatment, we will call you to review the results.   Testing/Procedures: None   Follow-Up: At [facility], you and your health needs are our priority.  As part of our continuing mission to provide you with exceptional heart care, we have created designated Provider Care Teams.  These Care Teams include your primary Cardiologist (physician) and Advanced Practice Providers (APPs -  Physician Assistants and Nurse Practitioners) who all work together to provide you with the care you need, when you need it.  We recommend signing up for the patient portal called "MyChart".  Sign up information is provided on this After Visit Summary.  MyChart is used to connect with patients for Virtual Visits (Telemedicine).  Patients are able to view lab/test results, encounter notes, upcoming appointments, etc.  Non-urgent messages can be sent to your provider as well.   To learn more about what you can do with MyChart, go to [email or web address].    Your next appointment:   2-3 month(s)  The format for your next appointment:   In Person  Provider:   You may see Mertie Moores, MD or one of the following Advanced Practice Providers on your designated Care Team:    Richardson Dopp, PA-C  [place], [place]  Daune Perch, NP    Other Instructions

## 2019-06-19 NOTE — Progress Notes (Signed)
Cardiology Office Note:    Date:  06/19/2019   ID:  [name], DOB 1955/08/02, MRN [id number]  PCP:  Marton Redwood, MD  Cardiologist:  Mertie Moores, MD  Electrophysiologist:  Constance Haw, MD   Referring MD: Marton Redwood, MD   Chief Complaint  Patient presents with  . Coronary Artery Disease  . Atrial Fibrillation    History of Present Illness:    Brittany Buckley is a 64 y.o. female with a hx of  CAD, prior MI (prior stenting in ~2014/2015 in [place] (stents in LCx and RCA based on stable cath in 06/2018), ADHD, asthma, chronic neck/back pain, persistent AFib, HTN, HLD, gastric ulcer, migraines, obesity, DM  She was last seen by me in 2018 for CAD   .  Has been under lots of stress moving into a new home.   Felt her heart racing .   She was seen in the ER on June 04, 2019 for palpitations and was found to have Afib. Took Multaq early  She spontaneously converted back to sinus rhythm.  CHADS2VASC is 4.  She is on pradaxa.  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 Adult ADHD   . Anxiety   . Arthritis    "hands, feet" (06/14/2016)  . Asthma   . Chronic lower back pain   . Chronic neck pain   . Coronary artery disease    a. history of MI and prior stenting in ~2014/2015 in [place] (stents in LCx and RCA based on stable cath in 06/2018).  . Gastric ulcer   . GERD (gastroesophageal reflux disease)   . Headache    "monthly" (06/14/2016)  . Heart murmur   . History of blood transfusion 1983   Bleed after C-Section  . Hyperlipidemia   . Hypertension   . MI (myocardial infarction) ([place]) 2014; 2015  . Migraine    "probably twice/year" (06/14/2016)  . Numbness of toes   . Obesity   . On anticoagulant therapy   . Peripheral neuropathy   .  Persistent atrial fibrillation ([place])   . Pneumonia 1990s  . Thyroid nodule   . Type II diabetes mellitus ([place])   . Urticaria     Past Surgical History:  Procedure Laterality Date  . ABDOMINAL HYSTERECTOMY    . BIOPSY THYROID    . BREAST CYST EXCISION Left 01/21/2019   Procedure: EXCISION OF LEFT BREAST MASS;  Surgeon: Jovita Kussmaul, MD;  Location: [facility];  Service: General;  Laterality: Left;  . BREAST EXCISIONAL BIOPSY Right   . CESAREAN SECTION    . CORONARY ANGIOPLASTY WITH STENT PLACEMENT  2014 & 2015  . LEFT HEART CATH AND CORONARY ANGIOGRAPHY N/A 07/09/2018   Procedure: LEFT HEART CATH AND CORONARY ANGIOGRAPHY;  Surgeon: Belva Crome, MD;  Location: [facility];  Service: Cardiovascular;  Laterality: N/A;  . TONSILLECTOMY AND ADENOIDECTOMY    . TRIGGER FINGER RELEASE Right    "pointer"    Current Medications: Current Meds  Medication Sig  . acetaminophen (TYLENOL) 650 MG CR tablet Take 1,300 mg by mouth every 8 (eight) hours as needed for pain.  Marland Kitchen albuterol (PROVENTIL HFA;VENTOLIN HFA) 108 (90 Base) MCG/ACT inhaler Inhale 1-2 puffs into the lungs every 6 (six) hours as  needed for wheezing or shortness of breath.  Marland Kitchen amLODipine (NORVASC) 5 MG tablet Take 1 tablet (5 mg total) by mouth daily.  Marland Kitchen atorvastatin (LIPITOR) 80 MG tablet Take 1 tablet (80 mg total) by mouth daily.  . carvedilol (COREG) 6.25 MG tablet Take 1 tablet (6.25 mg total) by mouth 2 (two) times daily with a meal.  . dabigatran (PRADAXA) 150 MG CAPS capsule Take 150 mg by mouth 2 (two) times daily.  . diphenhydrAMINE (BENADRYL) 25 MG tablet Take 25 mg by mouth every 6 (six) hours as needed for itching or allergies.   Marland Kitchen dronedarone (MULTAQ) 400 MG tablet Take 1 tablet (400 mg total) by mouth 2 (two) times daily with a meal.  . hydrocortisone cream 1 % Apply topically as needed for itching.  Marland Kitchen JARDIANCE 25 MG TABS tablet Take 25 mg by mouth daily.  Marland Kitchen lisinopril (PRINIVIL,ZESTRIL) 40 MG tablet Take 40 mg  by mouth daily.  . metFORMIN (GLUCOPHAGE) 1000 MG tablet Take 1,000 mg by mouth daily with breakfast.   . mupirocin ointment (BACTROBAN) 2 % Apply 1 application topically 2 (two) times daily.  . nitroGLYCERIN (NITROSTAT) 0.4 MG SL tablet Place 1 tablet (0.4 mg total) under the tongue every 5 (five) minutes as needed for chest pain.  Marland Kitchen omeprazole (PRILOSEC) 40 MG capsule Take 1 capsule (40 mg total) by mouth 2 (two) times daily.  . Semaglutide, 1 MG/DOSE, (OZEMPIC, 1 MG/DOSE,) 2 MG/1.5ML SOPN Inject 1 mg into the skin once a week. Saturday  . [DISCONTINUED] nitroGLYCERIN (NITROSTAT) 0.4 MG SL tablet Place 0.4 mg under the tongue every 5 (five) minutes as needed for chest pain.     Allergies:   Codeine, Onion, Tomato, Chocolate, and Demerol [meperidine]   Social History   Socioeconomic History  . Marital status: Married    Spouse name: Not on file  . Number of children: Not on file  . Years of education: Not on file  . Highest education level: Not on file  Occupational History  . Not on file  Tobacco Use  . Smoking status: Never Smoker  . Smokeless tobacco: Never Used  Substance and Sexual Activity  . Alcohol use: Not Currently  . Drug use: No  . Sexual activity: Yes    Comment: married  Other Topics Concern  . Not on file  Social History Narrative  . Not on file   Social Determinants of Health   Financial Resource Strain:   . Difficulty of Paying Living Expenses:   Food Insecurity:   . Worried About Charity fundraiser in the Last Year:   . Arboriculturist in the Last Year:   Transportation Needs:   . Film/video editor (Medical):   Marland Kitchen Lack of Transportation (Non-Medical):   Physical Activity:   . Days of Exercise per Week:   . Minutes of Exercise per Session:   Stress:   . Feeling of Stress :   Social Connections:   . Frequency of Communication with Friends and Family:   . Frequency of Social Gatherings with Friends and Family:   . Attends Religious Services:     . Active Member of Clubs or Organizations:   . Attends Archivist Meetings:   Marland Kitchen Marital Status:      Family History: The patient's family history includes CAD in her father; Cancer - Ovarian in her sister; Colon polyps in her mother; Diabetes in her maternal grandmother and paternal grandfather; Healthy in her son; Heart attack  in her father; Heart disease in her paternal grandmother; Hyperlipidemia in her mother; Obesity in her daughter.  ROS:   Please see the history of present illness.     All other systems reviewed and are negative.  EKGs/Labs/Other Studies Reviewed:    The following studies were reviewed today:   EKG:     Recent Labs: 06/03/2019: Hemoglobin 14.7; Platelets 218 06/04/2019: Magnesium 2.1; TSH 0.797 06/07/2019: BUN 22; Creatinine, Ser 0.77; Potassium 4.3; Sodium 141  Recent Lipid Panel    Component Value Date/Time   CHOL 179 04/04/2016 1540   TRIG 180 (H) 04/04/2016 1540   HDL 47 04/04/2016 1540   CHOLHDL 3.8 04/04/2016 1540   LDLCALC 96 04/04/2016 1540    Physical Exam:    VS:  BP 136/78   Pulse (!) 50   Ht 5\' 8"  (1.727 m)   Wt 194 lb 12.8 oz (88.4 kg)   SpO2 97%   BMI 29.62 kg/m     Wt Readings from Last 3 Encounters:  06/19/19 194 lb 12.8 oz (88.4 kg)  06/07/19 200 lb (90.7 kg)  02/01/19 188 lb (85.3 kg)     GEN:  Well nourished, well developed in no acute distress HEENT: Normal NECK: No JVD; No carotid bruits LYMPHATICS: No lymphadenopathy CARDIAC: RRR, soft systolic murmur  RESPIRATORY:  Clear to auscultation without rales, wheezing or rhonchi  ABDOMEN: Soft, non-tender, non-distended MUSCULOSKELETAL:  No edema; No deformity  SKIN: Warm and dry NEUROLOGIC:  Alert and oriented x 3 PSYCHIATRIC:  Normal affect   ASSESSMENT:    No diagnosis found. PLAN:    In order of problems listed above:  1. Coronary artery disease: Anahia presents with some episodes of angina.  She states that these are similar to her previous  episodes although not nearly as severe.  Her symptoms were improved with the increase of carvedilol.  We will add isosorbide mononitrate 30 mg a day.  I will see her in 2 to 3 months for follow-up visit.  As long as her symptoms are stable I think we can continue to observe.  Have advised her to let us know if her symptoms start to progress.     Medication Adjustments/Labs and Tests Ordered: Current medicines are reviewed at length with the patient today.  Concerns regarding medicines are outlined above.  No orders of the defined types were placed in this encounter.  Meds ordered this encounter  Medications  . isosorbide mononitrate (IMDUR) 30 MG 24 hr tablet    Sig: Take 1 tablet (30 mg total) by mouth daily.    Dispense:  90 tablet    Refill:  3  . nitroGLYCERIN (NITROSTAT) 0.4 MG SL tablet    Sig: Place 1 tablet (0.4 mg total) under the tongue every 5 (five) minutes as needed for chest pain.    Dispense:  25 tablet    Refill:  3    Patient Instructions  Medication Instructions:  1) START Imdur 30mg  once daily  *If you need a refill on your cardiac medications before your next appointment, please call your pharmacy*   Lab Work: None If you have labs (blood work) drawn today and your tests are completely normal, you will receive your results only by: Marland Kitchen MyChart Message (if you have MyChart) OR . A paper copy in the mail If you have any lab test that is abnormal or we need to change your treatment, we will call you to review the results.   Testing/Procedures: None  Follow-Up: At [facility], you and your health needs are our priority.  As part of our continuing mission to provide you with exceptional heart care, we have created designated Provider Care Teams.  These Care Teams include your primary Cardiologist (physician) and Advanced Practice Providers (APPs -  Physician Assistants and Nurse Practitioners) who all work together to provide you with the care you need, when  you need it.  We recommend signing up for the patient portal called "MyChart".  Sign up information is provided on this After Visit Summary.  MyChart is used to connect with patients for Virtual Visits (Telemedicine).  Patients are able to view lab/test results, encounter notes, upcoming appointments, etc.  Non-urgent messages can be sent to your provider as well.   To learn more about what you can do with MyChart, go to [email or web address].    Your next appointment:   2-3 month(s)  The format for your next appointment:   In Person  Provider:   You may see Mertie Moores, MD or one of the following Advanced Practice Providers on your designated Care Team:    Richardson Dopp, PA-C  Vin Erskine, [place]  Daune Perch, NP    Other Instructions      Signed, Mertie Moores, MD  06/19/2019 1:02 PM    [place]

## 2019-06-25 ENCOUNTER — Other Ambulatory Visit (HOSPITAL_COMMUNITY): Payer: Commercial Managed Care - PPO

## 2019-07-05 ENCOUNTER — Ambulatory Visit (HOSPITAL_COMMUNITY): Payer: Commercial Managed Care - PPO | Admitting: Nurse Practitioner

## 2019-07-08 ENCOUNTER — Encounter (HOSPITAL_COMMUNITY): Payer: Self-pay | Admitting: Nurse Practitioner

## 2019-07-08 ENCOUNTER — Other Ambulatory Visit: Payer: Self-pay

## 2019-07-08 ENCOUNTER — Ambulatory Visit (HOSPITAL_BASED_OUTPATIENT_CLINIC_OR_DEPARTMENT_OTHER)
Admission: RE | Admit: 2019-07-08 | Discharge: 2019-07-08 | Disposition: A | Discharge status: Home / Self Care | Payer: Commercial Managed Care - PPO | Source: Ambulatory Visit | Attending: Nurse Practitioner | Admitting: Nurse Practitioner

## 2019-07-08 ENCOUNTER — Emergency Department (HOSPITAL_COMMUNITY): Payer: Commercial Managed Care - PPO

## 2019-07-08 ENCOUNTER — Emergency Department (HOSPITAL_COMMUNITY)
Admission: EM | Admit: 2019-07-08 | Discharge: 2019-07-08 | Disposition: A | Discharge status: Home / Self Care | Payer: Commercial Managed Care - PPO | Attending: Emergency Medicine | Admitting: Emergency Medicine

## 2019-07-08 VITALS — BP 96/58 | HR 50 | Ht 68.0 in | Wt 194.0 lb

## 2019-07-08 DIAGNOSIS — I10 Essential (primary) hypertension: Secondary | ICD-10-CM | POA: Insufficient documentation

## 2019-07-08 DIAGNOSIS — R231 Pallor: Secondary | ICD-10-CM | POA: Insufficient documentation

## 2019-07-08 DIAGNOSIS — R61 Generalized hyperhidrosis: Secondary | ICD-10-CM | POA: Insufficient documentation

## 2019-07-08 DIAGNOSIS — Z7984 Long term (current) use of oral hypoglycemic drugs: Secondary | ICD-10-CM | POA: Insufficient documentation

## 2019-07-08 DIAGNOSIS — R109 Unspecified abdominal pain: Secondary | ICD-10-CM | POA: Insufficient documentation

## 2019-07-08 DIAGNOSIS — E119 Type 2 diabetes mellitus without complications: Secondary | ICD-10-CM | POA: Diagnosis not present

## 2019-07-08 DIAGNOSIS — Z79899 Other long term (current) drug therapy: Secondary | ICD-10-CM | POA: Diagnosis not present

## 2019-07-08 DIAGNOSIS — R0789 Other chest pain: Secondary | ICD-10-CM | POA: Insufficient documentation

## 2019-07-08 DIAGNOSIS — I2510 Atherosclerotic heart disease of native coronary artery without angina pectoris: Secondary | ICD-10-CM | POA: Diagnosis not present

## 2019-07-08 DIAGNOSIS — R42 Dizziness and giddiness: Secondary | ICD-10-CM | POA: Insufficient documentation

## 2019-07-08 DIAGNOSIS — R001 Bradycardia, unspecified: Secondary | ICD-10-CM | POA: Diagnosis present

## 2019-07-08 DIAGNOSIS — I480 Paroxysmal atrial fibrillation: Secondary | ICD-10-CM

## 2019-07-08 DIAGNOSIS — Z955 Presence of coronary angioplasty implant and graft: Secondary | ICD-10-CM | POA: Insufficient documentation

## 2019-07-08 DIAGNOSIS — J45909 Unspecified asthma, uncomplicated: Secondary | ICD-10-CM | POA: Insufficient documentation

## 2019-07-08 DIAGNOSIS — R110 Nausea: Secondary | ICD-10-CM | POA: Insufficient documentation

## 2019-07-08 DIAGNOSIS — D6869 Other thrombophilia: Secondary | ICD-10-CM

## 2019-07-08 LAB — CBC
HCT: 45.9 % (ref 36.0–46.0)
Hemoglobin: 14.5 g/dL (ref 12.0–15.0)
MCH: 28.5 pg (ref 26.0–34.0)
MCHC: 31.6 g/dL (ref 30.0–36.0)
MCV: 90.4 fL (ref 80.0–100.0)
Platelets: 197 10*3/uL (ref 150–400)
RBC: 5.08 MIL/uL (ref 3.87–5.11)
RDW: 13.5 % (ref 11.5–15.5)
WBC: 9.8 10*3/uL (ref 4.0–10.5)
nRBC: 0 % (ref 0.0–0.2)

## 2019-07-08 LAB — CBG MONITORING, ED: Glucose-Capillary: 167 mg/dL — ABNORMAL HIGH (ref 70–99)

## 2019-07-08 LAB — BASIC METABOLIC PANEL
Anion gap: 8 (ref 5–15)
BUN: 18 mg/dL (ref 8–23)
CO2: 24 mmol/L (ref 22–32)
Calcium: 9.3 mg/dL (ref 8.9–10.3)
Chloride: 108 mmol/L (ref 98–111)
Creatinine, Ser: 1.14 mg/dL — ABNORMAL HIGH (ref 0.44–1.00)
GFR calc Af Amer: 59 mL/min — ABNORMAL LOW (ref >60)
GFR calc non Af Amer: 51 mL/min — ABNORMAL LOW (ref >60)
Glucose, Bld: 177 mg/dL — ABNORMAL HIGH (ref 70–99)
Potassium: 4.3 mmol/L (ref 3.5–5.1)
Sodium: 140 mmol/L (ref 135–145)

## 2019-07-08 LAB — TROPONIN I (HIGH SENSITIVITY)
Troponin I (High Sensitivity): 7 ng/L (ref <18)
Troponin I (High Sensitivity): 5 ng/L (ref <18)

## 2019-07-08 MED ORDER — SODIUM CHLORIDE 0.9 % IV BOLUS (SEPSIS)
1000.0000 mL | Freq: Once | INTRAVENOUS | Status: AC
  Administered 2019-07-08: 1000 mL via INTRAVENOUS

## 2019-07-08 MED ORDER — SODIUM CHLORIDE 0.9 % IV SOLN
1000.0000 mL | INTRAVENOUS | Status: DC
  Administered 2019-07-08: 1000 mL via INTRAVENOUS

## 2019-07-08 MED ORDER — ASPIRIN 81 MG PO CHEW
324.0000 mg | CHEWABLE_TABLET | Freq: Once | ORAL | Status: AC
  Administered 2019-07-08: 324 mg via ORAL
  Filled 2019-07-08: qty 4

## 2019-07-08 MED ORDER — DABIGATRAN ETEXILATE MESYLATE 150 MG PO CAPS: 150.0000 mg | ORAL_CAPSULE | Freq: Two times a day (BID) | ORAL | 3 refills | Status: DC

## 2019-07-08 MED ORDER — CARVEDILOL 3.125 MG PO TABS: 3.1250 mg | ORAL_TABLET | Freq: Two times a day (BID) | ORAL | Status: DC

## 2019-07-08 NOTE — Consult Note (Signed)
CARDIOLOGY CONSULT NOTE       Patient ID: Brittany Buckley MRN: [id number] DOB/AGE: 1955/04/04 64 y.o.  Admit date: 07/08/2019 Referring Physician: Tomi Bamberger Primary Physician: Marton Redwood, MD Primary Cardiologist: Nahser Reason for Consultation: Curt Bears  Active Problems:   * No active hospital problems. *   HPI:  64 y.o. with history of MI and stenting in [place] 2015 RCA and circumflex patient by repeat cath May 2020. She has a history of DM, gastric ulcer, migraines and PAF. Had PAF 06/04/19 with spontaneous conversion. She has failed tikosyn and amiodarone in past She has had good luck with Multaq. Seen in afib clinic today. Felt ill ? Vagal went to bathroom and had abdominal cramping Does have history of IBS./GERD. BP/HR low 37 bpm and 88 systolic. No heart block sent to ER. Currently feels back to baseline with HR 55. ECG normal with no heart block or ischemia pulse 37. She has been taking coreg 6.25 bid since having her episode of PAF Cr/K normal . Currently eating lunch. No chest pain Telemetry with SR rate 57. Troponin negative and CXR NAD    ROS All other systems reviewed and negative except as noted above  Past Medical History:  Diagnosis Date  . Adult ADHD   . Anxiety   . Arthritis    "hands, feet" (06/14/2016)  . Asthma   . Chronic lower back pain   . Chronic neck pain   . Coronary artery disease    a. history of MI and prior stenting in ~2014/2015 in [place] (stents in LCx and RCA based on stable cath in 06/2018).  . Gastric ulcer   . GERD (gastroesophageal reflux disease)   . Headache    "monthly" (06/14/2016)  . Heart murmur   . History of blood transfusion 1983   Bleed after C-Section  . Hyperlipidemia   . Hypertension   . MI (myocardial infarction) ([place]) 2014; 2015  . Migraine    "probably twice/year" (06/14/2016)  . Numbness of toes   . Obesity   . On anticoagulant therapy   . Peripheral neuropathy   . Persistent atrial fibrillation ([place])   . Pneumonia  1990s  . Thyroid nodule   . Type II diabetes mellitus ([place])   . Urticaria     Family History  Problem Relation Age of Onset  . Hyperlipidemia Mother   . Colon polyps Mother   . CAD Father   . Heart attack Father   . Cancer - Ovarian Sister   . Diabetes Maternal Grandmother        blindness  . Heart disease Paternal Grandmother   . Diabetes Paternal Grandfather        with amputation  . Healthy Son   . Obesity Daughter     Social History   Socioeconomic History  . Marital status: Married    Spouse name: Not on file  . Number of children: Not on file  . Years of education: Not on file  . Highest education level: Not on file  Occupational History  . Not on file  Tobacco Use  . Smoking status: Never Smoker  . Smokeless tobacco: Never Used  Substance and Sexual Activity  . Alcohol use: Not Currently  . Drug use: No  . Sexual activity: Yes    Comment: married  Other Topics Concern  . Not on file  Social History Narrative  . Not on file   Social Determinants of Health   Financial Resource Strain:   . Difficulty of  Paying Living Expenses:   Food Insecurity:   . Worried About Charity fundraiser in the Last Year:   . Arboriculturist in the Last Year:   Transportation Needs:   . Film/video editor (Medical):   Marland Kitchen Lack of Transportation (Non-Medical):   Physical Activity:   . Days of Exercise per Week:   . Minutes of Exercise per Session:   Stress:   . Feeling of Stress :   Social Connections:   . Frequency of Communication with Friends and Family:   . Frequency of Social Gatherings with Friends and Family:   . Attends Religious Services:   . Active Member of Clubs or Organizations:   . Attends Archivist Meetings:   Marland Kitchen Marital Status:   Intimate Partner Violence:   . Fear of Current or Ex-Partner:   . Emotionally Abused:   Marland Kitchen Physically Abused:   . Sexually Abused:     Past Surgical History:  Procedure Laterality Date  . ABDOMINAL HYSTERECTOMY     . BIOPSY THYROID    . BREAST CYST EXCISION Left 01/21/2019   Procedure: EXCISION OF LEFT BREAST MASS;  Surgeon: Jovita Kussmaul, MD;  Location: [facility];  Service: General;  Laterality: Left;  . BREAST EXCISIONAL BIOPSY Right   . CESAREAN SECTION    . CORONARY ANGIOPLASTY WITH STENT PLACEMENT  2014 & 2015  . LEFT HEART CATH AND CORONARY ANGIOGRAPHY N/A 07/09/2018   Procedure: LEFT HEART CATH AND CORONARY ANGIOGRAPHY;  Surgeon: Belva Crome, MD;  Location: [facility];  Service: Cardiovascular;  Laterality: N/A;  . TONSILLECTOMY AND ADENOIDECTOMY    . TRIGGER FINGER RELEASE Right    "pointer"      Current Facility-Administered Medications:  .  [COMPLETED] sodium chloride 0.9 % bolus 1,000 mL, 1,000 mL, Intravenous, Once, Stopped at 07/08/19 1349 **FOLLOWED BY** 0.9 %  sodium chloride infusion, 1,000 mL, Intravenous, Continuous, Dorie Rank, MD, Last Rate: 125 mL/hr at 07/08/19 1356, 1,000 mL at 07/08/19 1356  Current Outpatient Medications:  .  acetaminophen (TYLENOL) 650 MG CR tablet, Take 1,300 mg by mouth as needed for pain. , Disp: , Rfl:  .  albuterol (PROVENTIL HFA;VENTOLIN HFA) 108 (90 Base) MCG/ACT inhaler, Inhale 1-2 puffs into the lungs every 6 (six) hours as needed for wheezing or shortness of breath., Disp: , Rfl:  .  amLODipine (NORVASC) 5 MG tablet, Take 1 tablet (5 mg total) by mouth daily., Disp: 90 tablet, Rfl: 3 .  atorvastatin (LIPITOR) 80 MG tablet, Take 1 tablet (80 mg total) by mouth daily., Disp: 90 tablet, Rfl: 1 .  buPROPion (WELLBUTRIN XL) 150 MG 24 hr tablet, Take 150 mg by mouth daily., Disp: , Rfl:  .  carvedilol (COREG) 6.25 MG tablet, Take 1 tablet (6.25 mg total) by mouth 2 (two) times daily with a meal., Disp: 60 tablet, Rfl: 6 .  dabigatran (PRADAXA) 150 MG CAPS capsule, Take 1 capsule (150 mg total) by mouth 2 (two) times daily., Disp: 60 capsule, Rfl: 3 .  diphenhydrAMINE (BENADRYL) 25 MG tablet, Take 25 mg by mouth every 6 (six) hours as needed for  itching or allergies. , Disp: , Rfl:  .  dronedarone (MULTAQ) 400 MG tablet, Take 1 tablet (400 mg total) by mouth 2 (two) times daily with a meal., Disp: 180 tablet, Rfl: 3 .  isosorbide mononitrate (IMDUR) 30 MG 24 hr tablet, Take 1 tablet (30 mg total) by mouth daily., Disp: 90 tablet, Rfl: 3 .  JARDIANCE 25 MG TABS tablet, Take 25 mg by mouth daily., Disp: , Rfl:  .  lisinopril (PRINIVIL,ZESTRIL) 40 MG tablet, Take 40 mg by mouth daily., Disp: , Rfl:  .  metFORMIN (GLUCOPHAGE) 1000 MG tablet, Take 1,000 mg by mouth 2 (two) times daily with a meal. , Disp: , Rfl:  .  nitroGLYCERIN (NITROSTAT) 0.4 MG SL tablet, Place 1 tablet (0.4 mg total) under the tongue every 5 (five) minutes as needed for chest pain., Disp: 25 tablet, Rfl: 3 .  omeprazole (PRILOSEC) 40 MG capsule, Take 1 capsule (40 mg total) by mouth 2 (two) times daily. (Patient taking differently: Take 40 mg by mouth daily as needed (heartburn). ), Disp: 60 capsule, Rfl: 2 .  hydrocortisone cream 1 %, Apply topically as needed for itching. (Patient not taking: Reported on 07/08/2019), Disp: 30 g, Rfl: 0 .  mupirocin ointment (BACTROBAN) 2 %, Apply 1 application topically 2 (two) times daily. (Patient not taking: Reported on 07/08/2019), Disp: 30 g, Rfl: 2  . sodium chloride 1,000 mL (07/08/19 1356)    Physical Exam: Blood pressure 114/62, pulse (!) 44, temperature 97.7 F (36.5 C), temperature source Oral, resp. rate 13, height 5\' 8"  (1.727 m), weight 88 kg, SpO2 98 %.   Affect appropriate Healthy:  appears stated age 12: normal Neck supple with no adenopathy JVP normal no bruits no thyromegaly Lungs clear with no wheezing and good diaphragmatic motion Heart:  S1/S2 3/6 SEM  murmur, no rub, gallop or click PMI normal Abdomen: benighn, BS positve, no tenderness, no AAA no bruit.  No HSM or HJR Distal pulses intact with no bruits No edema Neuro non-focal Skin warm and dry No muscular weakness   Labs:   Lab Results    Component Value Date   WBC 9.8 07/08/2019   HGB 14.5 07/08/2019   HCT 45.9 07/08/2019   MCV 90.4 07/08/2019   PLT 197 07/08/2019    Recent Labs  Lab 07/08/19 1212  NA 140  K 4.3  CL 108  CO2 24  BUN 18  CREATININE 1.14*  CALCIUM 9.3  GLUCOSE 177*   No results found for: CKTOTAL, CKMB, CKMBINDEX, TROPONINI  Lab Results  Component Value Date   CHOL 179 04/04/2016   Lab Results  Component Value Date   HDL 47 04/04/2016   Lab Results  Component Value Date   LDLCALC 96 04/04/2016   Lab Results  Component Value Date   TRIG 180 (H) 04/04/2016   Lab Results  Component Value Date   CHOLHDL 3.8 04/04/2016   No results found for: LDLDIRECT    Radiology: DG Chest Portable 1 View  Result Date: 07/08/2019 CLINICAL DATA:  Chest pain. Additional history provided: Hypotension, low pulse rate, chest pain. EXAM: PORTABLE CHEST 1 VIEW COMPARISON:  Heart size within normal limits. FINDINGS: Heart size within normal limits. No appreciable airspace consolidation within the lungs. No evidence of pleural effusion or pneumothorax. No acute bony abnormality identified. IMPRESSION: No evidence of acute cardiopulmonary abnormality. Electronically Signed   By: Kellie Simmering DO   On: 07/08/2019 12:16    EKG: SB rate 37 no AV block no ischemia   ASSESSMENT AND PLAN:   1. Bradycardia:  In setting of PAF, beta blocker Rx and ? Vagal reaction related to IBS/GERD. Resolved No coreg today and decrease dose to 3.125 mg in am. Have sent message to afib clinic and scheduling to arrange 30 day monitor  2. CAD:  No chest pain currently no ischemia  on ECG troponin negative stable continue medical Rx 3. PAF:  Continue Multaq and pradaxa Hct fine 4. DM:  Discussed low carb diet.  Target hemoglobin A1c is 6.5 or less.  Continue current medications. BS fine during symptoms  5. HLD:  Continue statin target LDL 70 or less    Signed: Jenkins Rouge 07/08/2019, 3:11 PM

## 2019-07-08 NOTE — ED Provider Notes (Signed)
[facility] EMERGENCY DEPARTMENT Provider Note   CSN: [id number] Arrival date & time: 07/08/19  1030     History Chief Complaint  Patient presents with  . Bradycardia    Brittany Buckley is a 64 y.o. female.  HPI  HPI: A 64 year old patient with a history of treated diabetes, hypertension and hypercholesterolemia presents for evaluation of chest pain. Initial onset of pain was less than one hour ago. The patient's chest pain is described as heaviness/pressure/tightness and is not worse with exertion. The patient complains of nausea and reports some diaphoresis. The patient's chest pain is middle- or left-sided, is not well-localized, is not sharp and does not radiate to the arms/jaw/neck. The patient has no history of stroke, has no history of peripheral artery disease, has not smoked in the past 90 days, has no relevant family history of coronary artery disease (first degree relative at less than age 13) and does not have an elevated BMI (>=30).  Patient was at the cardiology clinic office today for routine follow-up appointment of her atrial fibrillation and known coronary artery disease.  Patient states she felt fine when she first got up this morning.  She went to her doctor's appointment.  Patient states at one point she started have some abdominal cramping and went to the bathroom.  Patient began feeling lightheaded.  She also started develop some chest pressure which has now resolved.  She felt nauseated and also felt clammy and diaphoretic.  Patient was checked and was noted to be bradycardic with a heart rate of 37.  Patient was then sent down to the ED for further evaluation.  Patient was in the ED on April 5 for atrial fibrillation and had her carvedilol increased to twice daily.  Patient denies any chest pain now.  She did still feels lightheaded but is better lying in the bed Past Medical History:  Diagnosis Date  . Adult ADHD   . Anxiety   . Arthritis    "hands, feet" (06/14/2016)  . Asthma   . Chronic lower back pain   . Chronic neck pain   . Coronary artery disease    a. history of MI and prior stenting in ~2014/2015 in [place] (stents in LCx and RCA based on stable cath in 06/2018).  . Gastric ulcer   . GERD (gastroesophageal reflux disease)   . Headache    "monthly" (06/14/2016)  . Heart murmur   . History of blood transfusion 1983   Bleed after C-Section  . Hyperlipidemia   . Hypertension   . MI (myocardial infarction) ([place]) 2014; 2015  . Migraine    "probably twice/year" (06/14/2016)  . Numbness of toes   . Obesity   . On anticoagulant therapy   . Peripheral neuropathy   . Persistent atrial fibrillation ([place])   . Pneumonia 1990s  . Thyroid nodule   . Type II diabetes mellitus ([place])   . Urticaria     Patient Active Problem List   Diagnosis Date Noted  . Angina pectoris ([place]) 07/09/2018  . Colitis presumed to be due to infection 12/22/2016  . CAD in native artery 12/22/2016  . DM2 (diabetes mellitus, type 2) ([place]) 12/22/2016  . Acute hypotension 12/22/2016  . Atrial fibrillation with rapid ventricular response ([place]) 06/14/2016  . Murmur, cardiac 03/21/2016  . Hypercholesteremia 03/21/2016    Past Surgical History:  Procedure Laterality Date  . ABDOMINAL HYSTERECTOMY    . BIOPSY THYROID    . BREAST CYST EXCISION Left 01/21/2019  Procedure: EXCISION OF LEFT BREAST MASS;  Surgeon: Jovita Kussmaul, MD;  Location: [facility];  Service: General;  Laterality: Left;  . BREAST EXCISIONAL BIOPSY Right   . CESAREAN SECTION    . CORONARY ANGIOPLASTY WITH STENT PLACEMENT  2014 & 2015  . LEFT HEART CATH AND CORONARY ANGIOGRAPHY N/A 07/09/2018   Procedure: LEFT HEART CATH AND CORONARY ANGIOGRAPHY;  Surgeon: Belva Crome, MD;  Location: [facility];  Service: Cardiovascular;  Laterality: N/A;  . TONSILLECTOMY AND ADENOIDECTOMY    . TRIGGER FINGER RELEASE Right    "pointer"     OB History   No obstetric history on  file.     Family History  Problem Relation Age of Onset  . Hyperlipidemia Mother   . Colon polyps Mother   . CAD Father   . Heart attack Father   . Cancer - Ovarian Sister   . Diabetes Maternal Grandmother        blindness  . Heart disease Paternal Grandmother   . Diabetes Paternal Grandfather        with amputation  . Healthy Son   . Obesity Daughter     Social History   Tobacco Use  . Smoking status: Never Smoker  . Smokeless tobacco: Never Used  Substance Use Topics  . Alcohol use: Not Currently  . Drug use: No    Home Medications Prior to Admission medications   Medication Sig Start Date End Date Taking? Authorizing Provider  acetaminophen (TYLENOL) 650 MG CR tablet Take 1,300 mg by mouth as needed for pain.    Yes [provider]  albuterol (PROVENTIL HFA;VENTOLIN HFA) 108 (90 Base) MCG/ACT inhaler Inhale 1-2 puffs into the lungs every 6 (six) hours as needed for wheezing or shortness of breath.   Yes [provider]  amLODipine (NORVASC) 5 MG tablet Take 1 tablet (5 mg total) by mouth daily. 02/01/19  Yes Daune Perch, NP  atorvastatin (LIPITOR) 80 MG tablet Take 1 tablet (80 mg total) by mouth daily. 06/04/19  Yes Dunn, Dayna N, PA-C  buPROPion (WELLBUTRIN XL) 150 MG 24 hr tablet Take 150 mg by mouth daily. 06/29/19  Yes [provider]  dabigatran (PRADAXA) 150 MG CAPS capsule Take 1 capsule (150 mg total) by mouth 2 (two) times daily. 07/08/19  Yes Sherran Needs, NP  diphenhydrAMINE (BENADRYL) 25 MG tablet Take 25 mg by mouth every 6 (six) hours as needed for itching or allergies.    Yes [provider]  dronedarone (MULTAQ) 400 MG tablet Take 1 tablet (400 mg total) by mouth 2 (two) times daily with a meal. 02/01/19  Yes Daune Perch, NP  isosorbide mononitrate (IMDUR) 30 MG 24 hr tablet Take 1 tablet (30 mg total) by mouth daily. 06/19/19  Yes Nahser, Wonda Cheng, MD  JARDIANCE 25 MG TABS tablet Take 25 mg by mouth daily. 03/05/18   Yes [provider]  lisinopril (PRINIVIL,ZESTRIL) 40 MG tablet Take 40 mg by mouth daily.   Yes [provider]  metFORMIN (GLUCOPHAGE) 1000 MG tablet Take 1,000 mg by mouth 2 (two) times daily with a meal.    Yes [provider]  nitroGLYCERIN (NITROSTAT) 0.4 MG SL tablet Place 1 tablet (0.4 mg total) under the tongue every 5 (five) minutes as needed for chest pain. 06/19/19  Yes Nahser, Wonda Cheng, MD  omeprazole (PRILOSEC) 40 MG capsule Take 1 capsule (40 mg total) by mouth 2 (two) times daily. Patient taking differently: Take 40 mg  by mouth daily as needed (heartburn).  12/18/18  Yes Mansouraty, Telford Nab., MD  carvedilol (COREG) 3.125 MG tablet Take 1 tablet (3.125 mg total) by mouth 2 (two) times daily with a meal. 07/08/19   Dorie Rank, MD  hydrocortisone cream 1 % Apply topically as needed for itching. Patient not taking: Reported on 07/08/2019 06/17/16   Baldwin Jamaica, PA-C  mupirocin ointment (BACTROBAN) 2 % Apply 1 application topically 2 (two) times daily. Patient not taking: Reported on 07/08/2019 11/26/18   Trula Slade, DPM    Allergies    Codeine, Onion, Tomato, Chocolate, and Demerol [meperidine]  Review of Systems   Review of Systems  All other systems reviewed and are negative.   Physical Exam Updated Vital Signs BP 123/62   Pulse (!) 54   Temp 97.7 F (36.5 C) (Oral)   Resp 13   Ht 1.727 m (5\' 8" )   Wt 88 kg   SpO2 97%   BMI 29.50 kg/m   Physical Exam Vitals and nursing note reviewed.  Constitutional:      General: She is not in acute distress.    Appearance: She is well-developed.  HENT:     Head: Normocephalic and atraumatic.     Right Ear: External ear normal.     Left Ear: External ear normal.  Eyes:     General: No scleral icterus.       Right eye: No discharge.        Left eye: No discharge.     Conjunctiva/sclera: Conjunctivae normal.  Neck:     Trachea: No tracheal deviation.  Cardiovascular:     Rate and  Rhythm: Regular rhythm. Bradycardia present.  Pulmonary:     Effort: Pulmonary effort is normal. No respiratory distress.     Breath sounds: Normal breath sounds. No stridor. No wheezing or rales.  Abdominal:     General: Bowel sounds are normal. There is no distension.     Palpations: Abdomen is soft.     Tenderness: There is no abdominal tenderness. There is no guarding or rebound.  Musculoskeletal:        General: No tenderness.     Cervical back: Neck supple.  Skin:    General: Skin is warm and dry.     Findings: No rash.  Neurological:     Mental Status: She is alert.     Cranial Nerves: No cranial nerve deficit (no facial droop, extraocular movements intact, no slurred speech).     Sensory: No sensory deficit.     Motor: No abnormal muscle tone or seizure activity.     Coordination: Coordination normal.     ED Results / Procedures / Treatments   Labs (all labs ordered are listed, but only abnormal results are displayed) Labs Reviewed  BASIC METABOLIC PANEL - Abnormal; Notable for the following components:      Result Value   Glucose, Bld 177 (*)    Creatinine, Ser 1.14 (*)    GFR calc non Af Amer 51 (*)    GFR calc Af Amer 59 (*)    All other components within normal limits  CBG MONITORING, ED - Abnormal; Notable for the following components:   Glucose-Capillary 167 (*)    All other components within normal limits  CBC  TROPONIN I (HIGH SENSITIVITY)  TROPONIN I (HIGH SENSITIVITY)    EKG None  Radiology DG Chest Portable 1 View  Result Date: 07/08/2019 CLINICAL DATA:  Chest pain. Additional history provided: Hypotension,  low pulse rate, chest pain. EXAM: PORTABLE CHEST 1 VIEW COMPARISON:  Heart size within normal limits. FINDINGS: Heart size within normal limits. No appreciable airspace consolidation within the lungs. No evidence of pleural effusion or pneumothorax. No acute bony abnormality identified. IMPRESSION: No evidence of acute cardiopulmonary abnormality.  Electronically Signed   By: Kellie Simmering DO   On: 07/08/2019 12:16    Procedures Procedures (including critical care time)  Medications Ordered in ED Medications  sodium chloride 0.9 % bolus 1,000 mL (0 mLs Intravenous Stopped 07/08/19 1349)    Followed by  0.9 %  sodium chloride infusion (1,000 mLs Intravenous New Bag/Given 07/08/19 1356)  aspirin chewable tablet 324 mg (324 mg Oral Given 07/08/19 1123)    ED Course  I have reviewed the triage vital signs and the nursing notes.  Pertinent labs & imaging results that were available during my care of the patient were reviewed by me and considered in my medical decision making (see chart for details).  Clinical Course as of Jul 07 1552  Mon Jul 08, 2019  1551 Patient's laboratory tests are reassuring.  Serial cardiac enzymes negative.   [id number] Chest x-ray without acute findings.   [id number] Patient symptoms have improved.  She is feeling well.  Patient has been able to eat and drink.  Heart rate now in the mid 50s on the bedside monitor   [JK]    Clinical Course User Index [JK] Dorie Rank, MD   MDM Rules/Calculators/A&P HEAR Score: 5                    Patient presented to the ED with complaints of bradycardia noted while she was in the cardiology office.  Patient had an episode of abdominal cramping prior to that.  There may have been somewhat of a vasovagal component to her symptoms however the patient did have a persistent sinus bradycardia.  She is on Coreg that likely contributed to that.  Patient has known history of A. fib rapid ventricular rate and that is why she was on the Coreg.  Patient has improved while she has been in the ED.  Her heart rate is now in the mid 20s.  Patient is normotensive and she feels well.  She has not had any trouble with abdominal discomfort or chest pain since she has been here.  Discussed the case with Dr. Johnsie Cancel, cardiology.  Plan is for the patient to hold her Coreg for the next 2 days and  then start back at 3.125 mg twice daily. Final Clinical Impression(s) / ED Diagnoses Final diagnoses:  Symptomatic bradycardia    Rx / DC Orders ED Discharge Orders         Ordered    carvedilol (COREG) 3.125 MG tablet  2 times daily with meals     07/08/19 1550           Dorie Rank, MD 07/08/19 1554

## 2019-07-08 NOTE — Discharge Instructions (Addendum)
Stop your coreg for today and tomorrow. Decrease your coreg to 3.125 mg twice daily following that.  Follow up with your cardiologist to be rechecked

## 2019-07-08 NOTE — Progress Notes (Signed)
Primary Care Physician: Marton Redwood, MD Referring Physician: Loyal Jacobson, PA Cardiologist: Nahser,Philip EP: Dr. Margaretha Sheffield Beegle is a 64 y.o. female with a h/o CAD, prior MI (prior stenting in ~2014/2015 in [place] (stents in LCx and RCA based on stable cath in 06/2018), ADHD, asthma, chronic neck/back pain, persistent AFib, HTN, HLD, gastric ulcer, migraines, obesity, DM.  She presented to the ER 06/04/19, with an episode of afib. She  had a long wait in the ER but did self convert. Qt appeared long in the ER. Pt was on Multaq but was left in place. She  has failed amiodarone, tikosyn in the past. Not an candidate for flecainide as she has CAD.  She had a f/u with Renee 4/9, qt was acceptable, she remained on Multaq and no further afib. SHe was started on Imdur for sone chronic anginal symptoms.   She in in the afib clinic for f/u of the visit with [place]. Pt stated that she did not feel her usual at the time of the interview. Had some abdominal cramping but no diarrhea. She  has reported no further afib and she thought Imdur was helping her anginal symptoms.  HR noted to be sinus brady at 50 bpm with a soft BP in the 90's on presentation. Noted no other change in health. Toward  the end of  the visit, pt stated that she really did not feel well. She  was placed on  a stretcher. EKG was repeated and found to have a profound bradycardia at 37 bpm and BP at 88 sytolic. Pt was slightly clammy. FS Blood sugare was 238.  Charge nurse was notified and taken to  the ER by stretcher with RN in attendance.   Today, she denies symptoms of palpitations, chest pain, shortness of breath, orthopnea, PND, lower extremity edema, dizziness, presyncope, syncope, or neurologic sequela. The patient is tolerating medications without difficulties and is otherwise without complaint today.   Past Medical History:  Diagnosis Date  . Adult ADHD   . Anxiety   . Arthritis    "hands, feet" (06/14/2016)   . Asthma   . Chronic lower back pain   . Chronic neck pain   . Coronary artery disease    a. history of MI and prior stenting in ~2014/2015 in [place] (stents in LCx and RCA based on stable cath in 06/2018).  . Gastric ulcer   . GERD (gastroesophageal reflux disease)   . Headache    "monthly" (06/14/2016)  . Heart murmur   . History of blood transfusion 1983   Bleed after C-Section  . Hyperlipidemia   . Hypertension   . MI (myocardial infarction) ([place]) 2014; 2015  . Migraine    "probably twice/year" (06/14/2016)  . Numbness of toes   . Obesity   . On anticoagulant therapy   . Peripheral neuropathy   . Persistent atrial fibrillation ([place])   . Pneumonia 1990s  . Thyroid nodule   . Type II diabetes mellitus ([place])   . Urticaria    Past Surgical History:  Procedure Laterality Date  . ABDOMINAL HYSTERECTOMY    . BIOPSY THYROID    . BREAST CYST EXCISION Left 01/21/2019   Procedure: EXCISION OF LEFT BREAST MASS;  Surgeon: Jovita Kussmaul, MD;  Location: [facility];  Service: General;  Laterality: Left;  . BREAST EXCISIONAL BIOPSY Right   . CESAREAN SECTION    . CORONARY ANGIOPLASTY WITH STENT PLACEMENT  2014 & 2015  . LEFT HEART  CATH AND CORONARY ANGIOGRAPHY N/A 07/09/2018   Procedure: LEFT HEART CATH AND CORONARY ANGIOGRAPHY;  Surgeon: Belva Crome, MD;  Location: [facility];  Service: Cardiovascular;  Laterality: N/A;  . TONSILLECTOMY AND ADENOIDECTOMY    . TRIGGER FINGER RELEASE Right    "pointer"    Current Outpatient Medications  Medication Sig Dispense Refill  . acetaminophen (TYLENOL) 650 MG CR tablet Take 1,300 mg by mouth as needed for pain.     Marland Kitchen albuterol (PROVENTIL HFA;VENTOLIN HFA) 108 (90 Base) MCG/ACT inhaler Inhale 1-2 puffs into the lungs every 6 (six) hours as needed for wheezing or shortness of breath.    Marland Kitchen amLODipine (NORVASC) 5 MG tablet Take 1 tablet (5 mg total) by mouth daily. 90 tablet 3  . atorvastatin (LIPITOR) 80 MG tablet Take 1 tablet (80 mg  total) by mouth daily. 90 tablet 1  . buPROPion HCl (WELLBUTRIN PO) Taking 80mg  by mouth daily    . carvedilol (COREG) 6.25 MG tablet Take 1 tablet (6.25 mg total) by mouth 2 (two) times daily with a meal. 60 tablet 6  . dabigatran (PRADAXA) 150 MG CAPS capsule Take 150 mg by mouth 2 (two) times daily.    . diphenhydrAMINE (BENADRYL) 25 MG tablet Take 25 mg by mouth every 6 (six) hours as needed for itching or allergies.     Marland Kitchen dronedarone (MULTAQ) 400 MG tablet Take 1 tablet (400 mg total) by mouth 2 (two) times daily with a meal. 180 tablet 3  . hydrocortisone cream 1 % Apply topically as needed for itching. 30 g 0  . isosorbide mononitrate (IMDUR) 30 MG 24 hr tablet Take 1 tablet (30 mg total) by mouth daily. 90 tablet 3  . JARDIANCE 25 MG TABS tablet Take 25 mg by mouth daily.    Marland Kitchen lisinopril (PRINIVIL,ZESTRIL) 40 MG tablet Take 40 mg by mouth daily.    . metFORMIN (GLUCOPHAGE) 1000 MG tablet Take 1,000 mg by mouth daily with breakfast.     . mupirocin ointment (BACTROBAN) 2 % Apply 1 application topically 2 (two) times daily. 30 g 2  . nitroGLYCERIN (NITROSTAT) 0.4 MG SL tablet Place 1 tablet (0.4 mg total) under the tongue every 5 (five) minutes as needed for chest pain. 25 tablet 3  . omeprazole (PRILOSEC) 40 MG capsule Take 1 capsule (40 mg total) by mouth 2 (two) times daily. (Patient taking differently: Take 40 mg by mouth as needed. ) 60 capsule 2  . Semaglutide, 1 MG/DOSE, (OZEMPIC, 1 MG/DOSE,) 2 MG/1.5ML SOPN Inject 1 mg into the skin once a week. Saturday     No current facility-administered medications for this encounter.    Allergies  Allergen Reactions  . Codeine Hives    ONLY IN COUGH SYRUP  . Onion Other (See Comments)    Runny nose and chest/nasal congestion (IF RAW)  . Tomato Other (See Comments)    Runny nose and chest/nasal congestion (IF RAW)  . Chocolate Rash  . Demerol [Meperidine] Rash    Social History   Socioeconomic History  . Marital status: Married     Spouse name: Not on file  . Number of children: Not on file  . Years of education: Not on file  . Highest education level: Not on file  Occupational History  . Not on file  Tobacco Use  . Smoking status: Never Smoker  . Smokeless tobacco: Never Used  Substance and Sexual Activity  . Alcohol use: Not Currently  . Drug use: No  .  Sexual activity: Yes    Comment: married  Other Topics Concern  . Not on file  Social History Narrative  . Not on file   Social Determinants of Health   Financial Resource Strain:   . Difficulty of Paying Living Expenses:   Food Insecurity:   . Worried About Charity fundraiser in the Last Year:   . Arboriculturist in the Last Year:   Transportation Needs:   . Film/video editor (Medical):   Marland Kitchen Lack of Transportation (Non-Medical):   Physical Activity:   . Days of Exercise per Week:   . Minutes of Exercise per Session:   Stress:   . Feeling of Stress :   Social Connections:   . Frequency of Communication with Friends and Family:   . Frequency of Social Gatherings with Friends and Family:   . Attends Religious Services:   . Active Member of Clubs or Organizations:   . Attends Archivist Meetings:   Marland Kitchen Marital Status:   Intimate Partner Violence:   . Fear of Current or Ex-Partner:   . Emotionally Abused:   Marland Kitchen Physically Abused:   . Sexually Abused:     Family History  Problem Relation Age of Onset  . Hyperlipidemia Mother   . Colon polyps Mother   . CAD Father   . Heart attack Father   . Cancer - Ovarian Sister   . Diabetes Maternal Grandmother        blindness  . Heart disease Paternal Grandmother   . Diabetes Paternal Grandfather        with amputation  . Healthy Son   . Obesity Daughter     ROS- All systems are reviewed and negative except as per the HPI above  Physical Exam: Vitals:   07/08/19 0911  Pulse: (!) 50  Weight: 88 kg  Height: 5\' 8"  (1.727 m)   Wt Readings from Last 3 Encounters:  07/08/19 88 kg    06/19/19 88.4 kg  06/07/19 90.7 kg    Labs: Lab Results  Component Value Date   NA 141 06/07/2019   K 4.3 06/07/2019   CL 108 (H) 06/07/2019   CO2 20 06/07/2019   GLUCOSE 218 (H) 06/07/2019   BUN 22 06/07/2019   CREATININE 0.77 06/07/2019   CALCIUM 8.8 06/07/2019   MG 2.1 06/04/2019   No results found for: INR Lab Results  Component Value Date   CHOL 179 04/04/2016   HDL 47 04/04/2016   LDLCALC 96 04/04/2016   TRIG 180 (H) 04/04/2016     GEN- The patient is well appearing, alert and oriented x 3 today.   Head- normocephalic, atraumatic Eyes-  Sclera clear, conjunctiva pink Ears- hearing intact Oropharynx- clear Neck- supple, no JVP Lymph- no cervical lymphadenopathy Lungs- Clear to ausculation bilaterally, normal work of breathing Heart-slow regular rate and rhythm, no murmurs, rubs or gallops, PMI not laterally displaced GI- soft, NT, ND, + BS Extremities- no clubbing, cyanosis, or edema MS- no significant deformity or atrophy Skin- no rash or lesion Psych- euthymic mood, full affect Neuro- strength and sensation are intact  EKG-Sinus brady at 50 bpm initially, then symptomatic at 37 bpm  To ER    Assessment and Plan: 1. Afib Has been quiet Has been on Multaq in the past  2. Symptomatic bradycardia  Had only been on carvedilol once a day was increased to bid on ER visit May have to have meds adjusted for bradycardia Ablation  discussed but  pt had been doing well so wants to wait  3, CAD Chronic angina  Has been stared on Imdur in the last few weeks Was feeling some chest discomfort this am   F/u per ER visit   Butch Penny C. Mykayla Brinton, [facility] [address] [place], [place] [address] [phone]

## 2019-07-08 NOTE — ED Triage Notes (Signed)
Pt arrives via stretcher from the a fib clinic after being seen for routine exam she got up to go to the restroom and became faint feeling and week- pt was noted to the  Be bradycardic heart in 30s' - pt arrives with ekg.

## 2019-07-11 ENCOUNTER — Other Ambulatory Visit: Payer: Self-pay | Admitting: *Deleted

## 2019-07-11 DIAGNOSIS — R001 Bradycardia, unspecified: Secondary | ICD-10-CM

## 2019-07-15 ENCOUNTER — Telehealth: Payer: Self-pay | Admitting: *Deleted

## 2019-07-15 NOTE — Telephone Encounter (Signed)
Patient enrolled for [employer] to ship a 30 day cardiac event monitor to  [address], [place], [place] [address]/  Please contact [employer] at [phone] to provide a correct address if this information was not correct.  Monitor instructions will be included in the monitor kit and were also sent via My Chart Message.

## 2019-07-16 ENCOUNTER — Other Ambulatory Visit: Payer: Self-pay

## 2019-07-16 ENCOUNTER — Ambulatory Visit (HOSPITAL_COMMUNITY): Payer: Commercial Managed Care - PPO | Attending: Cardiovascular Disease

## 2019-07-16 DIAGNOSIS — R011 Cardiac murmur, unspecified: Secondary | ICD-10-CM | POA: Diagnosis not present

## 2019-07-16 DIAGNOSIS — I480 Paroxysmal atrial fibrillation: Secondary | ICD-10-CM | POA: Diagnosis not present

## 2019-07-23 ENCOUNTER — Encounter: Payer: Self-pay | Admitting: Cardiovascular Disease

## 2019-07-23 ENCOUNTER — Ambulatory Visit (INDEPENDENT_AMBULATORY_CARE_PROVIDER_SITE_OTHER): Payer: Commercial Managed Care - PPO

## 2019-07-23 DIAGNOSIS — R001 Bradycardia, unspecified: Secondary | ICD-10-CM | POA: Diagnosis not present

## 2019-07-30 ENCOUNTER — Encounter: Payer: Self-pay | Admitting: Cardiovascular Disease

## 2019-07-30 ENCOUNTER — Other Ambulatory Visit: Payer: Self-pay

## 2019-07-30 ENCOUNTER — Ambulatory Visit (INDEPENDENT_AMBULATORY_CARE_PROVIDER_SITE_OTHER): Payer: Commercial Managed Care - PPO | Admitting: Cardiovascular Disease

## 2019-07-30 VITALS — BP 138/74 | HR 60 | Ht 68.0 in | Wt 196.8 lb

## 2019-07-30 DIAGNOSIS — I2510 Atherosclerotic heart disease of native coronary artery without angina pectoris: Secondary | ICD-10-CM | POA: Diagnosis not present

## 2019-07-30 DIAGNOSIS — I4891 Unspecified atrial fibrillation: Secondary | ICD-10-CM

## 2019-07-30 NOTE — Progress Notes (Signed)
Cardiology Office Note:    Date:  07/30/2019   ID:  Brittany Buckley, DOB Jun 23, 1955, MRN [id number]  PCP:  Marton Redwood, MD  Cardiologist:  Mertie Moores, MD  Electrophysiologist:  Constance Haw, MD   Referring MD: Marton Redwood, MD   Chief Complaint  Patient presents with  . Coronary Artery Disease    History of Present Illness:    Brittany Buckley is a 64 y.o. female with a hx of  CAD, prior MI (prior stenting in ~2014/2015 in [place] (stents in LCx and RCA based on stable cath in 06/2018), ADHD, asthma, chronic neck/back pain, persistent AFib, HTN, HLD, gastric ulcer, migraines, obesity, DM  She was last seen by me in 2018 for CAD   .  Has been under lots of stress moving into a new home.   Felt her heart racing .   She was seen in the ER on June 04, 2019 for palpitations and was found to have Afib. Took Multaq early  She spontaneously converted back to sinus rhythm.  CHADS2VASC is 4.  She is on pradaxa.  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 She cut her Coreg to 3.125 a day .  No further episodes of CP .  ,  No angina  Is not exercising,  Walks her dog regularly  Is scheduled to see Dr. Brigitte Pulse this summer.    Past Medical History:  Diagnosis Date  . Adult ADHD   . Anxiety   . Arthritis    "hands, feet" (06/14/2016)  . Asthma   . Chronic lower back pain   . Chronic neck pain   . Coronary artery disease    a. history of MI and prior stenting in ~2014/2015 in [place] (stents in LCx and RCA based on stable cath in 06/2018).  . Gastric ulcer   . GERD (gastroesophageal reflux disease)   . Headache    "monthly" (06/14/2016)  . Heart murmur   . History of  blood transfusion 1983   Bleed after C-Section  . Hyperlipidemia   . Hypertension   . MI (myocardial infarction) ([place]) 2014; 2015  . Migraine    "probably twice/year" (06/14/2016)  . Numbness of toes   . Obesity   . On anticoagulant therapy   . Peripheral neuropathy   . Persistent atrial fibrillation ([place])   . Pneumonia 1990s  . Thyroid nodule   . Type II diabetes mellitus ([place])   . Urticaria     Past Surgical History:  Procedure Laterality Date  . ABDOMINAL HYSTERECTOMY    . BIOPSY THYROID    . BREAST CYST EXCISION Left 01/21/2019   Procedure: EXCISION OF LEFT BREAST MASS;  Surgeon: Jovita Kussmaul, MD;  Location: [facility];  Service: General;  Laterality: Left;  . BREAST EXCISIONAL BIOPSY Right   . CESAREAN SECTION    . CORONARY ANGIOPLASTY WITH STENT PLACEMENT  2014 & 2015  . LEFT HEART CATH AND CORONARY ANGIOGRAPHY N/A 07/09/2018   Procedure: LEFT HEART CATH AND CORONARY ANGIOGRAPHY;  Surgeon: Belva Crome, MD;  Location: [facility];  Service: Cardiovascular;  Laterality: N/A;  . TONSILLECTOMY AND ADENOIDECTOMY    .  TRIGGER FINGER RELEASE Right    "pointer"    Current Medications: Current Meds  Medication Sig  . acetaminophen (TYLENOL) 650 MG CR tablet Take 1,300 mg by mouth as needed for pain.   Marland Kitchen albuterol (PROVENTIL HFA;VENTOLIN HFA) 108 (90 Base) MCG/ACT inhaler Inhale 1-2 puffs into the lungs every 6 (six) hours as needed for wheezing or shortness of breath.  Marland Kitchen atorvastatin (LIPITOR) 80 MG tablet Take 1 tablet (80 mg total) by mouth daily.  Marland Kitchen buPROPion (WELLBUTRIN XL) 150 MG 24 hr tablet Take 150 mg by mouth daily.  . carvedilol (COREG) 3.125 MG tablet Take 3.125 mg by mouth daily.  . dabigatran (PRADAXA) 150 MG CAPS capsule Take 1 capsule (150 mg total) by mouth 2 (two) times daily.  . diphenhydrAMINE (BENADRYL) 25 MG tablet Take 25 mg by mouth every 6 (six) hours as needed for itching or allergies.   Marland Kitchen dronedarone (MULTAQ) 400 MG tablet Take 1 tablet (400 mg  total) by mouth 2 (two) times daily with a meal.  . hydrocortisone cream 1 % Apply topically as needed for itching.  . isosorbide mononitrate (IMDUR) 30 MG 24 hr tablet Take 1 tablet (30 mg total) by mouth daily.  Marland Kitchen JARDIANCE 25 MG TABS tablet Take 25 mg by mouth daily.  Marland Kitchen lisinopril (PRINIVIL,ZESTRIL) 40 MG tablet Take 40 mg by mouth daily.  . metFORMIN (GLUCOPHAGE) 1000 MG tablet Take 1,000 mg by mouth 2 (two) times daily with a meal.   . mupirocin ointment (BACTROBAN) 2 % Apply 1 application topically 2 (two) times daily.  . nitroGLYCERIN (NITROSTAT) 0.4 MG SL tablet Place 1 tablet (0.4 mg total) under the tongue every 5 (five) minutes as needed for chest pain.  Marland Kitchen omeprazole (PRILOSEC) 40 MG capsule Take 1 capsule (40 mg total) by mouth 2 (two) times daily.     Allergies:   Codeine, Onion, Tomato, Chocolate, and Demerol [meperidine]   Social History   Socioeconomic History  . Marital status: Married    Spouse name: Not on file  . Number of children: Not on file  . Years of education: Not on file  . Highest education level: Not on file  Occupational History  . Not on file  Tobacco Use  . Smoking status: Never Smoker  . Smokeless tobacco: Never Used  Substance and Sexual Activity  . Alcohol use: Not Currently  . Drug use: No  . Sexual activity: Yes    Comment: married  Other Topics Concern  . Not on file  Social History Narrative  . Not on file   Social Determinants of Health   Financial Resource Strain:   . Difficulty of Paying Living Expenses:   Food Insecurity:   . Worried About Charity fundraiser in the Last Year:   . Arboriculturist in the Last Year:   Transportation Needs:   . Film/video editor (Medical):   Marland Kitchen Lack of Transportation (Non-Medical):   Physical Activity:   . Days of Exercise per Week:   . Minutes of Exercise per Session:   Stress:   . Feeling of Stress :   Social Connections:   . Frequency of Communication with Friends and Family:   .  Frequency of Social Gatherings with Friends and Family:   . Attends Religious Services:   . Active Member of Clubs or Organizations:   . Attends Archivist Meetings:   Marland Kitchen Marital Status:      Family History: The patient's family history includes  CAD in her father; Cancer - Ovarian in her sister; Colon polyps in her mother; Diabetes in her maternal grandmother and paternal grandfather; Healthy in her son; Heart attack in her father; Heart disease in her paternal grandmother; Hyperlipidemia in her mother; Obesity in her daughter.  ROS:   Please see the history of present illness.     All other systems reviewed and are negative.  EKGs/Labs/Other Studies Reviewed:    The following studies were reviewed today:   EKG:     Recent Labs: 06/04/2019: Magnesium 2.1; TSH 0.797 07/08/2019: BUN 18; Creatinine, Ser 1.14; Hemoglobin 14.5; Platelets 197; Potassium 4.3; Sodium 140  Recent Lipid Panel    Component Value Date/Time   CHOL 179 04/04/2016 1540   TRIG 180 (H) 04/04/2016 1540   HDL 47 04/04/2016 1540   CHOLHDL 3.8 04/04/2016 1540   LDLCALC 96 04/04/2016 1540    Physical Exam:       ASSESSMENT:    1. CAD in native artery   2. Atrial fibrillation with rapid ventricular response (HCC)    PLAN:    In order of problems listed above:  1.  Coronary artery disease:   She denies any angina.  She recognizes that she needs to get out and walk more.  I encouraged her to continue with diet, exercise, weight loss  2   PAF:   Has remained in sinus rhythm.   Con current meds.    Medication Adjustments/Labs and Tests Ordered: Current medicines are reviewed at length with the patient today.  Concerns regarding medicines are outlined above.  No orders of the defined types were placed in this encounter.  No orders of the defined types were placed in this encounter.   Patient Instructions  Medication Instructions:  Your physician recommends that you continue on your current  medications as directed. Please refer to the Current Medication list given to you today.  *If you need a refill on your cardiac medications before your next appointment, please call your pharmacy*   Lab Work: None Ordered If you have labs (blood work) drawn today and your tests are completely normal, you will receive your results only by: Marland Kitchen MyChart Message (if you have MyChart) OR . A paper copy in the mail If you have any lab test that is abnormal or we need to change your treatment, we will call you to review the results.   Testing/Procedures: None Ordered   Follow-Up: At [facility], you and your health needs are our priority.  As part of our continuing mission to provide you with exceptional heart care, we have created designated Provider Care Teams.  These Care Teams include your primary Cardiologist (physician) and Advanced Practice Providers (APPs -  Physician Assistants and Nurse Practitioners) who all work together to provide you with the care you need, when you need it.  We recommend signing up for the patient portal called "MyChart".  Sign up information is provided on this After Visit Summary.  MyChart is used to connect with patients for Virtual Visits (Telemedicine).  Patients are able to view lab/test results, encounter notes, upcoming appointments, etc.  Non-urgent messages can be sent to your provider as well.   To learn more about what you can do with MyChart, go to [email or web address].    Your next appointment:   6 month(s)  The format for your next appointment:   Either In Person or Virtual  Provider:   You may see Mertie Moores, MD or one of the following Advanced  Practice Providers on your designated Care Team:    Richardson Dopp, PA-C  Robbie Lis, [place]        Signed, Mertie Moores, MD  07/30/2019 5:40 PM    [place]

## 2019-07-30 NOTE — Patient Instructions (Signed)
Medication Instructions:  °Your physician recommends that you continue on your current medications as directed. Please refer to the Current Medication list given to you today. ° °*If you need a refill on your cardiac medications before your next appointment, please call your pharmacy* ° ° °Lab Work: °None Ordered °If you have labs (blood work) drawn today and your tests are completely normal, you will receive your results only by: °• MyChart Message (if you have MyChart) OR °• A paper copy in the mail °If you have any lab test that is abnormal or we need to change your treatment, we will call you to review the results. ° ° °Testing/Procedures: °None Ordered ° ° °Follow-Up: °At [facility], you and your health needs are our priority.  As part of our continuing mission to provide you with exceptional heart care, we have created designated Provider Care Teams.  These Care Teams include your primary Cardiologist (physician) and Advanced Practice Providers (APPs -  Physician Assistants and Nurse Practitioners) who all work together to provide you with the care you need, when you need it. ° °We recommend signing up for the patient portal called "MyChart".  Sign up information is provided on this After Visit Summary.  MyChart is used to connect with patients for Virtual Visits (Telemedicine).  Patients are able to view lab/test results, encounter notes, upcoming appointments, etc.  Non-urgent messages can be sent to your provider as well.   °To learn more about what you can do with MyChart, go to [email or web address].   ° °Your next appointment:   °6 month(s) ° °The format for your next appointment:   °Either In Person or Virtual ° °Provider:   °You may see Philip Nahser, MD or one of the following Advanced Practice Providers on your designated Care Team:   °· Scott Weaver, PA-C °· Vin Bhagat, PA-C ° ° ° ° °

## 2019-08-19 ENCOUNTER — Telehealth: Payer: Self-pay | Admitting: *Deleted

## 2019-08-19 NOTE — Telephone Encounter (Signed)
[employer] faxed over auto-triggered event on this pt from 08/16/19 at 9:19 pm CST, showing the pt to be in afib, sinus rhythm w/PVCs (1 in 1 min), with a rate of 100 bpm.  This was an auto-triggered event. Pt does have a history of PAF.  She is on Multaq and Pradaxa.  Pt is followed in afib clinic as well. Monitor was ordered on the pt from recent ER visit with pt having symptomatic bradycardia.  Tried calling the pt to inquire her symptoms and activity during recorded event, and she did not answer.  Did leave her a VM to call the office back and request to speak with a triage nurse, to further speak about her monitor recording from 6/18.  Triage to await for pts call back.

## 2019-08-19 NOTE — Telephone Encounter (Signed)
Pt was contacted.  She states she had mild fluttering during this time, but no other cardiac symptoms.  She states it only lasted about a minute or so. Pt confirms she is taking all her prescribed cardiac meds.  Informed the pt that I went ahead and showed our DOD Dr. Radford Pax her monitor recording.  Informed the pt that per Dr. Radford Pax, pt was noted to be in PAF, then sinus with one PVC.  Per Dr. Radford Pax, pt should continue her current med regimen, continue monitoring, and route this message to Dr. Curt Bears and Afib clinic to make them aware of monitor result. Pt verbalized understanding and agrees with this plan. Will place monitor in med rec box to be scanned.

## 2019-08-26 ENCOUNTER — Telehealth: Payer: Self-pay | Admitting: Cardiovascular Disease

## 2019-08-26 NOTE — Telephone Encounter (Signed)
See monitor results for notes

## 2019-08-26 NOTE — Telephone Encounter (Signed)
Pt called back returning a call from our office about test results. 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

## 2019-08-30 ENCOUNTER — Ambulatory Visit: Payer: Commercial Managed Care - PPO | Admitting: Cardiovascular Disease

## 2019-08-30 ENCOUNTER — Telehealth: Payer: Self-pay | Admitting: Cardiovascular Disease

## 2019-08-30 NOTE — Telephone Encounter (Signed)
lmtcb

## 2019-08-30 NOTE — Telephone Encounter (Signed)
New message   Patient states that she is returning call for [employer]. Please call.

## 2019-09-27 ENCOUNTER — Telehealth: Payer: Self-pay | Admitting: *Deleted

## 2019-09-27 NOTE — Telephone Encounter (Signed)
lmtcb

## 2019-09-27 NOTE — Telephone Encounter (Signed)
Stanton Kidney, RN  08/30/2019 9:09 AM EDT Back to Top    Lmtcb to discuss overdue f/u w/ Dr. Curt Bears  (Curt Bears, Ocie Doyne, MD - Stanton Kidney, RN Have not seen patient since 02/2017. Needs follow up.)

## 2019-09-27 NOTE — Telephone Encounter (Signed)
Scheduler spoke with pt who reports that she prefers to hold off on seeing EP again for now.  States she just saw Dr. Acie Fredrickson and will discuss w/ him if she needs to see EP again right now.

## 2019-12-17 ENCOUNTER — Other Ambulatory Visit (HOSPITAL_COMMUNITY): Payer: Self-pay | Admitting: Nurse Practitioner

## 2019-12-17 ENCOUNTER — Other Ambulatory Visit: Payer: Self-pay | Admitting: Gastroenterology

## 2019-12-17 NOTE — Telephone Encounter (Signed)
Prescription refill for Pradaxa received.   LOV: Nahser, 07/30/2019 Scr: 1.14, 07/08/2019 Weight: 89.2 kg  Age: 64 yo CrCl: 71 ml/min   Prescription refill sent.

## 2020-01-16 ENCOUNTER — Other Ambulatory Visit: Payer: Self-pay | Admitting: Physician Assistant

## 2020-01-19 ENCOUNTER — Emergency Department (HOSPITAL_COMMUNITY): Payer: Commercial Managed Care - PPO

## 2020-01-19 ENCOUNTER — Other Ambulatory Visit: Payer: Self-pay

## 2020-01-19 ENCOUNTER — Encounter (HOSPITAL_COMMUNITY): Payer: Self-pay | Admitting: *Deleted

## 2020-01-19 ENCOUNTER — Emergency Department (HOSPITAL_COMMUNITY)
Admission: EM | Admit: 2020-01-19 | Discharge: 2020-01-20 | Disposition: A | Discharge status: Home / Self Care | Payer: Commercial Managed Care - PPO | Attending: Emergency Medicine | Admitting: Emergency Medicine

## 2020-01-19 DIAGNOSIS — S82831A Other fracture of upper and lower end of right fibula, initial encounter for closed fracture: Secondary | ICD-10-CM

## 2020-01-19 DIAGNOSIS — S8251XA Displaced fracture of medial malleolus of right tibia, initial encounter for closed fracture: Secondary | ICD-10-CM

## 2020-01-19 DIAGNOSIS — T1490XA Injury, unspecified, initial encounter: Secondary | ICD-10-CM

## 2020-01-19 DIAGNOSIS — Z79899 Other long term (current) drug therapy: Secondary | ICD-10-CM | POA: Insufficient documentation

## 2020-01-19 DIAGNOSIS — Z7901 Long term (current) use of anticoagulants: Secondary | ICD-10-CM | POA: Insufficient documentation

## 2020-01-19 DIAGNOSIS — W010XXA Fall on same level from slipping, tripping and stumbling without subsequent striking against object, initial encounter: Secondary | ICD-10-CM | POA: Insufficient documentation

## 2020-01-19 DIAGNOSIS — J45909 Unspecified asthma, uncomplicated: Secondary | ICD-10-CM | POA: Diagnosis not present

## 2020-01-19 DIAGNOSIS — I4819 Other persistent atrial fibrillation: Secondary | ICD-10-CM | POA: Insufficient documentation

## 2020-01-19 DIAGNOSIS — Z955 Presence of coronary angioplasty implant and graft: Secondary | ICD-10-CM | POA: Diagnosis not present

## 2020-01-19 DIAGNOSIS — R930 Abnormal findings on diagnostic imaging of skull and head, not elsewhere classified: Secondary | ICD-10-CM | POA: Diagnosis not present

## 2020-01-19 DIAGNOSIS — Z20822 Contact with and (suspected) exposure to covid-19: Secondary | ICD-10-CM | POA: Diagnosis not present

## 2020-01-19 DIAGNOSIS — I10 Essential (primary) hypertension: Secondary | ICD-10-CM | POA: Insufficient documentation

## 2020-01-19 DIAGNOSIS — I2510 Atherosclerotic heart disease of native coronary artery without angina pectoris: Secondary | ICD-10-CM | POA: Diagnosis not present

## 2020-01-19 DIAGNOSIS — Y9301 Activity, walking, marching and hiking: Secondary | ICD-10-CM | POA: Insufficient documentation

## 2020-01-19 DIAGNOSIS — E119 Type 2 diabetes mellitus without complications: Secondary | ICD-10-CM | POA: Diagnosis not present

## 2020-01-19 DIAGNOSIS — Z7984 Long term (current) use of oral hypoglycemic drugs: Secondary | ICD-10-CM | POA: Insufficient documentation

## 2020-01-19 DIAGNOSIS — S99911A Unspecified injury of right ankle, initial encounter: Secondary | ICD-10-CM | POA: Diagnosis present

## 2020-01-19 HISTORY — DX: Pure hypercholesterolemia, unspecified: E78.00

## 2020-01-19 HISTORY — DX: Unspecified atrial fibrillation: I48.91

## 2020-01-19 HISTORY — DX: Atherosclerotic heart disease of native coronary artery without angina pectoris: I25.10

## 2020-01-19 HISTORY — DX: Acute myocardial infarction, unspecified: I21.9

## 2020-01-19 LAB — RESPIRATORY PANEL BY RT PCR (FLU A&B, COVID)
Influenza A by PCR: NEGATIVE
Influenza B by PCR: NEGATIVE
SARS Coronavirus 2 by RT PCR: NEGATIVE

## 2020-01-19 MED ORDER — FENTANYL CITRATE (PF) 100 MCG/2ML IJ SOLN
50.0000 ug | Freq: Once | INTRAMUSCULAR | Status: AC
  Administered 2020-01-19: 50 ug via INTRAVENOUS

## 2020-01-19 MED ORDER — FENTANYL CITRATE (PF) 100 MCG/2ML IJ SOLN
INTRAMUSCULAR | Status: AC
  Filled 2020-01-19: qty 2

## 2020-01-19 MED ORDER — OXYCODONE HCL 5 MG PO TABS
5.0000 mg | ORAL_TABLET | ORAL | 0 refills | Status: DC | PRN
Start: 2020-01-19 — End: 2020-04-03

## 2020-01-19 MED ORDER — FENTANYL CITRATE (PF) 100 MCG/2ML IJ SOLN
100.0000 ug | Freq: Once | INTRAMUSCULAR | Status: AC
  Administered 2020-01-19: 100 ug via INTRAVENOUS
  Filled 2020-01-19: qty 2

## 2020-01-19 NOTE — ED Provider Notes (Signed)
[facility] EMERGENCY DEPARTMENT Provider Note   CSN: [id number] Arrival date & time: 01/19/20  1943     History Chief Complaint  Patient presents with  . Fall    Jeraldin Fesler is a 64 y.o. female.  HPI      64yo female with history of CAD, DM hyperlipidemia, atrial fibrillation on pradaxa presents as a Level II trauma for fall on anticoagulation.  She was walking her dog down a hill and slipped on the leaves.  She hurt her ankle, hearing a pop. Reports pain to her right ankle which is severe, worse with palpation and movement.  She denies other areas of pain acute. Reports she has otherwise been in normal state of health.  Denies headache, chest pain, abdominal pain, hip pain.  No shortness of breath nausea, vomiting or numbness/weakness.  Past Medical History:  Diagnosis Date  . Adult ADHD   . Anxiety   . Arthritis    "hands, feet" (06/14/2016)  . Asthma   . Atrial fibrillation ([place])   . CAD (coronary artery disease)   . Chronic lower back pain   . Chronic neck pain   . Coronary artery disease    a. history of MI and prior stenting in ~2014/2015 in [place] (stents in LCx and RCA based on stable cath in 06/2018).  . Diabetes mellitus without complication ([place])   . Gastric ulcer   . GERD (gastroesophageal reflux disease)   . Headache    "monthly" (06/14/2016)  . Heart murmur   . High cholesterol   . History of blood transfusion 1983   Bleed after C-Section  . Hyperlipidemia   . Hypertension   . MI (myocardial infarction) ([place]) 2014; 2015  . Migraine    "probably twice/year" (06/14/2016)  . Myocardial infarction ([place])   . Numbness of toes   . Obesity   . On anticoagulant therapy   . Peripheral neuropathy   . Persistent atrial fibrillation ([place])   . Pneumonia 1990s  . Thyroid nodule   . Type II diabetes mellitus ([place])   . Urticaria     Patient Active Problem List   Diagnosis Date Noted  . Angina pectoris ([place]) 07/09/2018  . Colitis  presumed to be due to infection 12/22/2016  . CAD in native artery 12/22/2016  . DM2 (diabetes mellitus, type 2) ([place]) 12/22/2016  . Acute hypotension 12/22/2016  . Atrial fibrillation with rapid ventricular response ([place]) 06/14/2016  . Murmur, cardiac 03/21/2016  . Hypercholesteremia 03/21/2016    Past Surgical History:  Procedure Laterality Date  . ABDOMINAL HYSTERECTOMY    . ADENOIDECTOMY    . BIOPSY THYROID    . BREAST CYST EXCISION Left 01/21/2019   Procedure: EXCISION OF LEFT BREAST MASS;  Surgeon: Jovita Kussmaul, MD;  Location: [facility];  Service: General;  Laterality: Left;  . BREAST EXCISIONAL BIOPSY Right   . CESAREAN SECTION    . CORONARY ANGIOPLASTY WITH STENT PLACEMENT  2014 & 2015  . CORONARY ANGIOPLASTY WITH STENT PLACEMENT    . LEFT HEART CATH AND CORONARY ANGIOGRAPHY N/A 07/09/2018   Procedure: LEFT HEART CATH AND CORONARY ANGIOGRAPHY;  Surgeon: Belva Crome, MD;  Location: [facility];  Service: Cardiovascular;  Laterality: N/A;  . TONSILLECTOMY    . TONSILLECTOMY AND ADENOIDECTOMY    . TRIGGER FINGER RELEASE Right    "pointer"     OB History   No obstetric history on file.     Family History  Problem Relation  Age of Onset  . Hyperlipidemia Mother   . Colon polyps Mother   . CAD Father   . Heart attack Father   . Cancer - Ovarian Sister   . Diabetes Maternal Grandmother        blindness  . Heart disease Paternal Grandmother   . Diabetes Paternal Grandfather        with amputation  . Healthy Son   . Obesity Daughter     Social History   Tobacco Use  . Smoking status: Never Smoker  . Smokeless tobacco: Never Used  Substance Use Topics  . Alcohol use: Not Currently  . Drug use: Not Currently    Home Medications Prior to Admission medications   Medication Sig Start Date End Date Taking? Authorizing Provider  acetaminophen (TYLENOL) 500 MG tablet Take 1,000 mg by mouth every 6 (six) hours as needed for headache (pain).   Yes [provider]  albuterol (VENTOLIN HFA) 108 (90 Base) MCG/ACT inhaler Inhale 2 puffs into the lungs every 6 (six) hours as needed for wheezing or shortness of breath.  12/02/19  Yes [provider]  atorvastatin (LIPITOR) 80 MG tablet Take 80 mg by mouth daily. 01/16/20  Yes [provider]  buPROPion (WELLBUTRIN XL) 150 MG 24 hr tablet Take 150 mg by mouth daily. 01/16/20  Yes [provider]  carvedilol (COREG) 6.25 MG tablet Take 6.25 mg by mouth 2 (two) times daily. 01/16/20  Yes [provider]  dabigatran (PRADAXA) 150 MG CAPS capsule Take 150 mg by mouth 2 (two) times daily.   Yes [provider]  dronedarone (MULTAQ) 400 MG tablet Take 400 mg by mouth 2 (two) times daily.   Yes [provider]  empagliflozin (JARDIANCE) 25 MG TABS tablet Take 25 mg by mouth daily.   Yes [provider]  isosorbide mononitrate (IMDUR) 30 MG 24 hr tablet Take 30 mg by mouth daily. 01/16/20  Yes [provider]  lisinopril (ZESTRIL) 40 MG tablet Take 40 mg by mouth daily. 12/17/19  Yes [provider]  metFORMIN (GLUCOPHAGE) 1000 MG tablet Take 1,000 mg by mouth 2 (two) times daily. 01/16/20  Yes [provider]  nitroGLYCERIN (NITROSTAT) 0.4 MG SL tablet Place 0.4 mg under the tongue every 5 (five) minutes as needed for chest pain.  09/13/19  Yes [provider]  omeprazole (PRILOSEC) 40 MG capsule Take 40 mg by mouth daily as needed (acid reflux).  01/16/20  Yes [provider]  Semaglutide, 1 MG/DOSE, (OZEMPIC, 1 MG/DOSE,) 4 MG/3ML SOPN Inject 1 mg into the skin every Saturday.   Yes [provider]  acetaminophen (TYLENOL) 650 MG CR tablet Take 1,300 mg by mouth as needed for pain.     [provider]  albuterol (PROVENTIL HFA;VENTOLIN HFA) 108 (90 Base) MCG/ACT inhaler Inhale 1-2 puffs into the lungs every 6 (six) hours as needed for wheezing or shortness of breath.    [provider]  atorvastatin (LIPITOR) 80 MG tablet TAKE 1 TABLET(80 MG) BY MOUTH DAILY 01/16/20   Nahser, Wonda Cheng, MD  buPROPion (WELLBUTRIN XL) 150 MG 24 hr tablet Take 150 mg by mouth daily. 06/29/19   [provider]  carvedilol (COREG) 3.125 MG tablet Take 3.125 mg by mouth daily.    [provider]  diphenhydrAMINE (BENADRYL) 25 MG tablet Take 25 mg by mouth every 6 (six) hours as needed for itching or allergies.     [provider]  dronedarone Robin Searing)  400 MG tablet Take 1 tablet (400 mg total) by mouth 2 (two) times daily with a meal. 02/01/19   Daune Perch, NP  hydrocortisone cream 1 % Apply topically as needed for itching. 06/17/16   Baldwin Jamaica, PA-C  isosorbide mononitrate (IMDUR) 30 MG 24 hr tablet Take 1 tablet (30 mg total) by mouth daily. 06/19/19   Nahser, Wonda Cheng, MD  JARDIANCE 25 MG TABS tablet Take 25 mg by mouth daily. 03/05/18   [provider]  lisinopril (PRINIVIL,ZESTRIL) 40 MG tablet Take 40 mg by mouth daily.    [provider]  metFORMIN (GLUCOPHAGE) 1000 MG tablet Take 1,000 mg by mouth 2 (two) times daily with a meal.     [provider]  mupirocin ointment (BACTROBAN) 2 % Apply 1 application topically 2 (two) times daily. 11/26/18   Trula Slade, DPM  nitroGLYCERIN (NITROSTAT) 0.4 MG SL tablet Place 1 tablet (0.4 mg total) under the tongue every 5 (five) minutes as needed for chest pain. 06/19/19   Nahser, Wonda Cheng, MD  omeprazole (PRILOSEC) 40 MG capsule TAKE 1 CAPSULE(40 MG) BY MOUTH TWICE DAILY 12/17/19   Mansouraty, Telford Nab., MD  oxyCODONE (ROXICODONE) 5 MG immediate release tablet Take 1 tablet (5 mg total) by mouth every 4 (four) hours as needed for severe pain. 01/19/20   Gareth Morgan, MD  PRADAXA 150 MG CAPS capsule TAKE ONE CAPSULE BY MOUTH TWICE DAILY 12/17/19   Nahser, Wonda Cheng, MD    Allergies    Codeine, Codeine, Onion, Onion, Tomato, Tomato, Chocolate, Chocolate, Demerol [meperidine  hcl], and Demerol [meperidine]  Review of Systems   Review of Systems  Constitutional: Negative for fever.  HENT: Negative for sore throat.   Eyes: Negative for visual disturbance.  Respiratory: Negative for cough and shortness of breath.   Cardiovascular: Negative for chest pain.  Gastrointestinal: Negative for abdominal pain, nausea and vomiting.  Genitourinary: Negative for difficulty urinating.  Musculoskeletal: Positive for arthralgias and gait problem. Negative for back pain (no acute/worsening) and neck pain.  Skin: Negative for rash.  Neurological: Negative for syncope and headaches.    Physical Exam Updated Vital Signs BP (!) 171/80   Pulse (!) 54   Temp (!) 97.3 F (36.3 C)   Resp 12   Ht 5\' 8"  (1.727 m)   Wt 93 kg   SpO2 95%   BMI 31.17 kg/m   Physical Exam Vitals and nursing note reviewed.  Constitutional:      General: She is not in acute distress.    Appearance: She is well-developed. She is not diaphoretic.  HENT:     Head: Normocephalic and atraumatic.  Eyes:     Conjunctiva/sclera: Conjunctivae normal.  Cardiovascular:     Rate and Rhythm: Normal rate and regular rhythm.     Heart sounds: Normal heart sounds. No murmur heard.  No friction rub. No gallop.   Pulmonary:     Effort: Pulmonary effort is normal. No respiratory distress.     Breath sounds: Normal breath sounds. No wheezing or rales.  Chest:     Chest wall: No tenderness.  Abdominal:     General: There is no distension.     Palpations: Abdomen is soft.     Tenderness: There is no abdominal tenderness. There is no guarding.  Musculoskeletal:        General: Swelling (right ankle) and tenderness present.     Cervical back: Normal range of motion.     Comments: Normal pulses,  normal sensation  Skin:    General: Skin is warm and dry.     Findings: No erythema or rash.  Neurological:     Mental Status: She is alert and oriented to person, place, and time.     ED Results / Procedures  / Treatments   Labs (all labs ordered are listed, but only abnormal results are displayed) Labs Reviewed  RESPIRATORY PANEL BY RT PCR (FLU A&B, COVID)    EKG EKG Interpretation  Date/Time:  Sunday January 19 2020 19:49:45 EST Ventricular Rate:  54 PR Interval:    QRS Duration: 92 QT Interval:  514 QTC Calculation: 488 R Axis:   81 Text Interpretation: Sinus rhythm Borderline right axis deviation Borderline prolonged QT interval No previous ECGs available Confirmed by Gareth Morgan [phone]) on 01/19/2020 10:11:24 PM Also confirmed by Gareth Morgan [phone]), editor Gean Quint [phone])  on 01/20/2020 7:20:48 AM   Radiology CT HEAD WO CONTRAST  Result Date: 01/19/2020 CLINICAL DATA:  Fall, pain EXAM: CT HEAD WITHOUT CONTRAST TECHNIQUE: Contiguous axial images were obtained from the base of the skull through the vertex without intravenous contrast. COMPARISON:  None. FINDINGS: Brain: No evidence of acute territorial infarction, hemorrhage, hydrocephalus,extra-axial collection or mass lesion/mass effect. Normal gray-white differentiation. Ventricles are normal in size and contour. Vascular: No hyperdense vessel or unexpected calcification. Skull: The skull is intact. No fracture or focal lesion identified. Sinuses/Orbits: The visualized paranasal sinuses and mastoid air cells are clear. The orbits and globes intact. Other: None Cervical spine: Alignment: Physiologic Skull base and vertebrae: Visualized skull base is intact. No atlanto-occipital dissociation. The vertebral body heights are well maintained. No fracture or pathologic osseous lesion seen. Large anterior osteophytes are noted at C4-C5. Soft tissues and spinal canal: The visualized paraspinal soft tissues are unremarkable. No prevertebral soft tissue swelling is seen. The spinal canal is grossly unremarkable, no large epidural collection or significant canal narrowing. Disc levels: Cervical spine spondylosis is seen most notable  at C4-C5 with mild neural foraminal narrowing and mild central canal stenosis. Upper chest: The lung apices are clear. There is a heterogeneous enlargement with a 2.4 cm lesion seen within the left thyroid lobe. Other: None IMPRESSION: 1. No acute intracranial abnormality. 2.  No acute fracture or malalignment of the spine. 3. Heterogeneous 2 cm lesion within the left thyroid lobe. Recommend thyroid US (ref: J Am Coll Radiol. 2015 Feb;12(2): 143-50). Electronically Signed   By: Prudencio Pair M.D.   On: 01/19/2020 20:49   CT Cervical Spine Wo Contrast  Result Date: 01/19/2020 CLINICAL DATA:  Fall, pain EXAM: CT HEAD WITHOUT CONTRAST TECHNIQUE: Contiguous axial images were obtained from the base of the skull through the vertex without intravenous contrast. COMPARISON:  None. FINDINGS: Brain: No evidence of acute territorial infarction, hemorrhage, hydrocephalus,extra-axial collection or mass lesion/mass effect. Normal gray-white differentiation. Ventricles are normal in size and contour. Vascular: No hyperdense vessel or unexpected calcification. Skull: The skull is intact. No fracture or focal lesion identified. Sinuses/Orbits: The visualized paranasal sinuses and mastoid air cells are clear. The orbits and globes intact. Other: None Cervical spine: Alignment: Physiologic Skull base and vertebrae: Visualized skull base is intact. No atlanto-occipital dissociation. The vertebral body heights are well maintained. No fracture or pathologic osseous lesion seen. Large anterior osteophytes are noted at C4-C5. Soft tissues and spinal canal: The visualized paraspinal soft tissues are unremarkable. No prevertebral soft tissue swelling is seen. The spinal canal is grossly unremarkable, no large epidural collection or significant canal narrowing. Disc  levels: Cervical spine spondylosis is seen most notable at C4-C5 with mild neural foraminal narrowing and mild central canal stenosis. Upper chest: The lung apices are clear.  There is a heterogeneous enlargement with a 2.4 cm lesion seen within the left thyroid lobe. Other: None IMPRESSION: 1. No acute intracranial abnormality. 2.  No acute fracture or malalignment of the spine. 3. Heterogeneous 2 cm lesion within the left thyroid lobe. Recommend thyroid US (ref: J Am Coll Radiol. 2015 Feb;12(2): 143-50). Electronically Signed   By: Prudencio Pair M.D.   On: 01/19/2020 20:49   DG Ankle Right Port  Result Date: 01/19/2020 CLINICAL DATA:  Slip and fall EXAM: PORTABLE RIGHT ANKLE - 2 VIEW COMPARISON:  None. FINDINGS: There is a nondisplaced obliquely oriented fracture seen through the distal fibula at the level of the ankle mortise. Small cystic fragment seen adjacent to the medial malleolus. There is slight widening of the medial clear space measuring up to 6 mm. There is diffuse soft tissue swelling. A small ankle joint effusion is noted. Calcaneal enthesophytes are seen. IMPRESSION: Nondisplaced distal fibular fracture with widening of the medial clear space. Tiny ossific fragment seen adjacent to the medial malleolus, likely small avulsion injury. Electronically Signed   By: Prudencio Pair M.D.   On: 01/19/2020 20:37    Procedures Procedures (including critical care time)  Medications Ordered in ED Medications  fentaNYL (SUBLIMAZE) injection 50 mcg (50 mcg Intravenous Given 01/19/20 2040)  fentaNYL (SUBLIMAZE) injection 100 mcg (100 mcg Intravenous Given 01/19/20 2239)  oxyCODONE-acetaminophen (PERCOCET/ROXICET) 5-325 MG per tablet 1 tablet (1 tablet Oral Given 01/20/20 0007)    ED Course  I have reviewed the triage vital signs and the nursing notes.  Pertinent labs & imaging results that were available during my care of the patient were reviewed by me and considered in my medical decision making (see chart for details).    MDM Rules/Calculators/A&P                          64yo female with history of CAD, DM hyperlipidemia, atrial fibrillation on pradaxa presents  as a Level II trauma for fall on anticoagulation that she sustained falling while walking her dog and slipping on leaves.  CT head and CSpine done given pt on anticoagulation and with potentially distracting injury without acute abnormalities.    No sign of injuries to the chest, abdomen, pelvis or T/L spine.  No other medical concerns.  XR right ankle shows distal fibula fracture and possible avulsion fracture medial malleolus. Closed fx, pt NV intact. Discussed with Dr. Alvan Dame per patient's request who reports he will discuss with Dr. Doran Durand close follow up hopefully tomorrow. Placed in splint, recommend non weight bearing, given crutches-also has walker at home.  Reviewed in [place] drug database and discussed risks of narcotic rx and given rx for oxycodone. Patient discharged in stable condition with understanding of reasons to return.     Final Clinical Impression(s) / ED Diagnoses Final diagnoses:  Trauma  Other closed fracture of distal end of right fibula, initial encounter  Closed avulsion fracture of medial malleolus of right tibia, initial encounter    Rx / DC Orders ED Discharge Orders         Ordered    oxyCODONE (ROXICODONE) 5 MG immediate release tablet  Every 4 hours PRN        01/19/20 2354           Gareth Morgan, MD  01/20/20 1147  

## 2020-01-19 NOTE — ED Notes (Signed)
ED Provider at bedside. 

## 2020-01-19 NOTE — Discharge Instructions (Addendum)
Hold your pradaxa tonight prior to your appointment tomorrow in case plan is to schedule close operation. Do not bear weight on your ankle.

## 2020-01-19 NOTE — ED Notes (Signed)
ED Provider at bedside to discuss plan of care 

## 2020-01-19 NOTE — ED Notes (Signed)
RN informed of Pt elevated vital signs

## 2020-01-19 NOTE — ED Triage Notes (Signed)
Pt arrives via [facility] from home. Pt was walking down hill with her dog, slipped on the leaves. Pt is on PRADAXA, did not hit her head, no blood thinners. She has deformity of the right ankle. She rec'd Fentanyl 127mcg and 500NS en route to IV in the left hand. Current pain is 4/10. VSS.

## 2020-01-19 NOTE — ED Notes (Signed)
Tell the pt mother hunter call and ask to tell the pt to call her when she get a chance

## 2020-01-20 ENCOUNTER — Encounter: Payer: Self-pay | Admitting: Cardiovascular Disease

## 2020-01-20 MED ORDER — OXYCODONE-ACETAMINOPHEN 5-325 MG PO TABS
1.0000 | ORAL_TABLET | Freq: Once | ORAL | Status: AC
  Administered 2020-01-20: 1 via ORAL
  Filled 2020-01-20: qty 1

## 2020-01-20 NOTE — Progress Notes (Signed)
Orthopedic Tech Progress Note Patient Details:  Rosamae Rocque 1955/07/13 [id number]  Ortho Devices Type of Ortho Device: Crutches, Post (short leg) splint Ortho Device/Splint Location: rle. Ortho Device/Splint Interventions: Ordered, Application, Adjustment   Post Interventions Patient Tolerated: Well Instructions Provided: Care of device, Adjustment of device   Karolee Stamps 01/20/2020, 12:01 AM

## 2020-01-20 NOTE — ED Notes (Signed)
Pt given both verbal and written d/c instructions, verbalized understanding of the same. Taken to lobby in w/c, to be picked up by her pastor

## 2020-01-22 ENCOUNTER — Telehealth: Payer: Self-pay | Admitting: Cardiovascular Disease

## 2020-01-22 NOTE — Telephone Encounter (Signed)
Patient was told to stop taking dabigatran (PRADAXA) 150 MG CAPS capsule prior to the surgery she had on 01/21/20 and she would like to verify when she should start taking it again. Please call/advise. Patient says if she does not answer a voicemail is fine to be left.   Thank you!

## 2020-01-22 NOTE — Telephone Encounter (Signed)
Pt states she received direction as to when she was to stop taking her Pradaxa prior to her tib/fib surgery, but she is unsure when she is to restart back taking this medication, from a post-op perspective.  Informed the pt that per our protocol, we advise on how long Pradaxa should be held prior to any surgery, but its up to the performing Surgeon to advise from a surgical standpoint, as to when she should start back taking this medication.  Advised the pt to reach out to her Orthopedic Surgeon, Dr. Doran Durand, and have his office advise when she can safely resume back taking her Pradaxa, from a post-op surgical perspective.  Pt verbalized understanding and agrees with this plan. Pt states she will call them now and inquire from Dr. Doran Durand as to when she should start back taking this med.  Will send this message to Dr. Acie Fredrickson as a general FYI.

## 2020-01-23 NOTE — Telephone Encounter (Signed)
I agree with the note by Winifred Olive, RN. I would like to have her restart the Pradaxa now ( or as soon as possible from a surgical standpoint)

## 2020-01-27 ENCOUNTER — Telehealth: Payer: Self-pay | Admitting: Cardiovascular Disease

## 2020-01-27 ENCOUNTER — Ambulatory Visit: Payer: Commercial Managed Care - PPO | Admitting: Cardiovascular Disease

## 2020-01-27 MED ORDER — LISINOPRIL 40 MG PO TABS: 40.0000 mg | ORAL_TABLET | Freq: Every day | ORAL | 1 refills | Status: DC

## 2020-01-27 NOTE — Telephone Encounter (Signed)
Pt's medication was sent to pt's pharmacy as requested. Confirmation received.  °

## 2020-01-27 NOTE — Telephone Encounter (Signed)
*  STAT* If patient is at the pharmacy, call can be transferred to refill team.   1. Which medications need to be refilled? (please list name of each medication and dose if known) lisinopril  2. Which pharmacy/location (including street and city if local pharmacy) is medication to be sent to? [facility]  3. Do they need a 30 day or 90 day supply? [place]

## 2020-02-11 ENCOUNTER — Encounter: Payer: Self-pay | Admitting: *Deleted

## 2020-02-18 NOTE — Congregational Nurse Program (Signed)
  Dept: [facility] Nurse Program Note  Date of Encounter: 02/11/2020  Past Medical History: Past Medical History:  Diagnosis Date  . Adult ADHD   . Anxiety   . Arthritis    "hands, feet" (06/14/2016)  . Asthma   . Atrial fibrillation ([place])   . CAD (coronary artery disease)   . Chronic lower back pain   . Chronic neck pain   . Coronary artery disease    a. history of MI and prior stenting in ~2014/2015 in [place] (stents in LCx and RCA based on stable cath in 06/2018).  . Diabetes mellitus without complication ([place])   . Gastric ulcer   . GERD (gastroesophageal reflux disease)   . Headache    "monthly" (06/14/2016)  . Heart murmur   . High cholesterol   . History of blood transfusion 1983   Bleed after C-Section  . Hyperlipidemia   . Hypertension   . MI (myocardial infarction) ([place]) 2014; 2015  . Migraine    "probably twice/year" (06/14/2016)  . Myocardial infarction ([place])   . Numbness of toes   . Obesity   . On anticoagulant therapy   . Peripheral neuropathy   . Persistent atrial fibrillation ([place])   . Pneumonia 1990s  . Thyroid nodule   . Type II diabetes mellitus ([place])   . Urticaria     Encounter Details:  CNP Questionnaire - 02/11/20 1800     Questionnaire   Do you give verbal consent to treat you today? Yes    Visit Setting Home    Location Patient Served [facility]    Patient Status Not Applicable    Medical Provider Yes    Insurance Private Insurance    Intervention Assess (including screenings);Support    ED Visit Averted Yes          Visited client in her home.  Client is post ankle surgery and is partial WB.  She is using her walker in the apartment.  Client tells me that she is a diabetic and would like me to check her blood sugar on a future visit.  Client seems well today, is talkative and smiling during discussions.  A deacon from [facility] and I met with client and her husband.  Will plan follow up visit in the  future.  Client plans to be out of town until 02/25/20 for Christmas.  This CN will follow up after client has returned from vacation.  Offered support and encouragement during visit.  Karene Fry, RN, MSN, [place] Office [phone] Cell

## 2020-03-05 ENCOUNTER — Ambulatory Visit: Payer: Commercial Managed Care - PPO | Admitting: Cardiovascular Disease

## 2020-03-16 ENCOUNTER — Other Ambulatory Visit: Payer: Self-pay | Admitting: Physician Assistant

## 2020-03-16 ENCOUNTER — Other Ambulatory Visit: Payer: Self-pay | Admitting: Gastroenterology

## 2020-03-16 NOTE — Telephone Encounter (Signed)
Hey can you please correct this on pt's chart. Thanks

## 2020-03-16 NOTE — Telephone Encounter (Signed)
Pt's pharmacy requesting a refill on carvedilol 6.25 mg tablet. There are 2 different strength of carvedilol medications. Do pt supposed to be taking both? Please address

## 2020-04-02 ENCOUNTER — Encounter: Payer: Self-pay | Admitting: Cardiovascular Disease

## 2020-04-02 NOTE — H&P (View-Only) (Signed)
Cardiology Office Note:    Date:  04/03/2020   ID:  [name], DOB March 13, 1955, MRN [id number]  PCP:  Marton Redwood, MD  Cardiologist:  Mertie Moores, MD  Electrophysiologist:  Constance Haw, MD   Referring MD: Marton Redwood, MD   Chief Complaint  Patient presents with  . Coronary Artery Disease    History of Present Illness:    Brittany Buckley is a 65 y.o. female with a hx of  CAD, prior MI (prior stenting in ~2014/2015 in [place] (stents in LCx and RCA based on stable cath in 06/2018), ADHD, asthma, chronic neck/back pain, persistent AFib, HTN, HLD, gastric ulcer, migraines, obesity, DM  She was last seen by me in 2018 for CAD   .  Has been under lots of stress moving into a new home.   Felt her heart racing .   She was seen in the ER on June 04, 2019 for palpitations and was found to have Afib. Took Multaq early  She spontaneously converted back to sinus rhythm.  CHADS2VASC is 4.  She is on pradaxa.  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 She cut her Coreg to 3.125 a day .  No further episodes of CP .  ,  No angina  Is not exercising,  Walks her dog regularly  Is scheduled to see Dr. Brigitte Pulse this summer.   Feb. 4, 2022: Brittany Buckley is seen for follow up of her CAD, Atrial fib, HTN She broke her right ankle in Nov.    Still healing up from that  Has some chest pains - usually at night while at rest , radiates through to her back , pressure like sensation  Takes NTG with relief.  Not all that similar to her MI pain  Has been present intermittantly for 3 months ,  Has not worsened significantly  Does not seem to be exertional     Past Medical History:   Diagnosis Date  . Adult ADHD   . Anxiety   . Arthritis    "hands, feet" (06/14/2016)  . Asthma   . Atrial fibrillation ([place])   . CAD (coronary artery disease)   . Chronic lower back pain   . Chronic neck pain   . Coronary artery disease    a. history of MI and prior stenting in ~2014/2015 in [place] (stents in LCx and RCA based on stable cath in 06/2018).  . Diabetes mellitus without complication ([place])   . Gastric ulcer   . GERD (gastroesophageal reflux disease)   . Headache    "monthly" (06/14/2016)  . Heart murmur   . High cholesterol   . History of blood transfusion 1983   Bleed after C-Section  . Hyperlipidemia   . Hypertension   . MI (myocardial infarction) ([place]) 2014; 2015  . Migraine    "probably twice/year" (06/14/2016)  . Myocardial infarction ([place])   . Numbness of toes   . Obesity   . On anticoagulant therapy   . Peripheral neuropathy   . Persistent atrial fibrillation ([place])   . Pneumonia 1990s  . Thyroid nodule   . Type II diabetes mellitus ([place])   . Urticaria  Past Surgical History:  Procedure Laterality Date  . ABDOMINAL HYSTERECTOMY    . ADENOIDECTOMY    . BIOPSY THYROID    . BREAST CYST EXCISION Left 01/21/2019   Procedure: EXCISION OF LEFT BREAST MASS;  Surgeon: Jovita Kussmaul, MD;  Location: [facility];  Service: General;  Laterality: Left;  . BREAST EXCISIONAL BIOPSY Right   . CESAREAN SECTION    . CORONARY ANGIOPLASTY WITH STENT PLACEMENT  2014 & 2015  . CORONARY ANGIOPLASTY WITH STENT PLACEMENT    . LEFT HEART CATH AND CORONARY ANGIOGRAPHY N/A 07/09/2018   Procedure: LEFT HEART CATH AND CORONARY ANGIOGRAPHY;  Surgeon: Belva Crome, MD;  Location: [facility];  Service: Cardiovascular;  Laterality: N/A;  . TONSILLECTOMY    . TONSILLECTOMY AND ADENOIDECTOMY    . TRIGGER FINGER RELEASE Right    "pointer"    Current Medications: Current Meds  Medication Sig  . acetaminophen (TYLENOL) 500 MG tablet Take 1,000 mg by mouth every 6 (six)  hours as needed for headache (pain).  Marland Kitchen acetaminophen (TYLENOL) 650 MG CR tablet Take 1,300 mg by mouth as needed for pain.   Marland Kitchen albuterol (VENTOLIN HFA) 108 (90 Base) MCG/ACT inhaler Inhale 2 puffs into the lungs every 6 (six) hours as needed for wheezing or shortness of breath.   Marland Kitchen atorvastatin (LIPITOR) 80 MG tablet Take 80 mg by mouth daily.  Marland Kitchen buPROPion (WELLBUTRIN XL) 150 MG 24 hr tablet Take 150 mg by mouth daily.  . carvedilol (COREG) 3.125 MG tablet Take 3.125 mg by mouth daily.  . diphenhydrAMINE (BENADRYL) 25 MG tablet Take 25 mg by mouth every 6 (six) hours as needed for itching or allergies.   Marland Kitchen dronedarone (MULTAQ) 400 MG tablet Take 1 tablet (400 mg total) by mouth 2 (two) times daily with a meal.  . hydrocortisone cream 1 % Apply topically as needed for itching.  . isosorbide mononitrate (IMDUR) 30 MG 24 hr tablet Take 30 mg by mouth daily.  Marland Kitchen JARDIANCE 25 MG TABS tablet Take 25 mg by mouth daily.  Marland Kitchen lisinopril (ZESTRIL) 40 MG tablet Take 1 tablet (40 mg total) by mouth daily.  . metFORMIN (GLUCOPHAGE) 1000 MG tablet Take 1,000 mg by mouth 2 (two) times daily.  . nitroGLYCERIN (NITROSTAT) 0.4 MG SL tablet Place 0.4 mg under the tongue every 5 (five) minutes as needed for chest pain.   Marland Kitchen omeprazole (PRILOSEC) 40 MG capsule Take 40 mg by mouth daily as needed (acid reflux).   Marland Kitchen PRADAXA 150 MG CAPS capsule TAKE ONE CAPSULE BY MOUTH TWICE DAILY  . [DISCONTINUED] albuterol (PROVENTIL HFA;VENTOLIN HFA) 108 (90 Base) MCG/ACT inhaler Inhale 1-2 puffs into the lungs every 6 (six) hours as needed for wheezing or shortness of breath.  . [DISCONTINUED] atorvastatin (LIPITOR) 80 MG tablet TAKE 1 TABLET(80 MG) BY MOUTH DAILY  . [DISCONTINUED] buPROPion (WELLBUTRIN XL) 150 MG 24 hr tablet Take 150 mg by mouth daily.  . [DISCONTINUED] dabigatran (PRADAXA) 150 MG CAPS capsule Take 150 mg by mouth 2 (two) times daily.  . [DISCONTINUED] dronedarone (MULTAQ) 400 MG tablet Take 400 mg by mouth 2 (two)  times daily.  . [DISCONTINUED] empagliflozin (JARDIANCE) 25 MG TABS tablet Take 25 mg by mouth daily.  . [DISCONTINUED] isosorbide mononitrate (IMDUR) 30 MG 24 hr tablet Take 1 tablet (30 mg total) by mouth daily.  . [DISCONTINUED] metFORMIN (GLUCOPHAGE) 1000 MG tablet Take 1,000 mg by mouth 2 (two) times daily with a meal.   . [DISCONTINUED] mupirocin  ointment (BACTROBAN) 2 % Apply 1 application topically 2 (two) times daily.  . [DISCONTINUED] nitroGLYCERIN (NITROSTAT) 0.4 MG SL tablet Place 1 tablet (0.4 mg total) under the tongue every 5 (five) minutes as needed for chest pain.  . [DISCONTINUED] omeprazole (PRILOSEC) 40 MG capsule TAKE 1 CAPSULE(40 MG) BY MOUTH TWICE DAILY  . [DISCONTINUED] oxyCODONE (ROXICODONE) 5 MG immediate release tablet Take 1 tablet (5 mg total) by mouth every 4 (four) hours as needed for severe pain.  . [DISCONTINUED] Semaglutide, 1 MG/DOSE, (OZEMPIC, 1 MG/DOSE,) 4 MG/3ML SOPN Inject 1 mg into the skin every Saturday.     Allergies:   Codeine, Codeine, Onion, Tomato, Tomato, Chocolate, Chocolate, Demerol [meperidine hcl], and Demerol [meperidine]   Social History   Socioeconomic History  . Marital status: Married    Spouse name: Not on file  . Number of children: Not on file  . Years of education: Not on file  . Highest education level: Not on file  Occupational History  . Not on file  Tobacco Use  . Smoking status: Never Smoker  . Smokeless tobacco: Never Used  Substance and Sexual Activity  . Alcohol use: Not Currently  . Drug use: Not Currently  . Sexual activity: Yes    Comment: married  Other Topics Concern  . Not on file  Social History Narrative   ** Merged History Encounter **       Social Determinants of Health   Financial Resource Strain: Not on file  Food Insecurity: Not on file  Transportation Needs: Not on file  Physical Activity: Not on file  Stress: Not on file  Social Connections: Not on file     Family History: The patient's  family history includes CAD in her father; Cancer - Ovarian in her sister; Colon polyps in her mother; Diabetes in her maternal grandmother and paternal grandfather; Healthy in her son; Heart attack in her father; Heart disease in her paternal grandmother; Hyperlipidemia in her mother; Obesity in her daughter.  ROS:   Please see the history of present illness.     All other systems reviewed and are negative.  EKGs/Labs/Other Studies Reviewed:    The following studies were reviewed today:  Physical Exam: Blood pressure 122/82, pulse (!) 58, height 5' 8" (1.727 m), weight 201 lb (91.2 kg), SpO2 97 %.  GEN:  Well nourished, well developed in no acute distress HEENT: Normal NECK: No JVD; No carotid bruits LYMPHATICS: No lymphadenopathy CARDIAC: RRR , no murmurs, rubs, gallops RESPIRATORY:  Clear to auscultation without rales, wheezing or rhonchi  ABDOMEN: Soft, non-tender, non-distended MUSCULOSKELETAL:  No edema; No deformity  SKIN: Warm and dry NEUROLOGIC:  Alert and oriented x 3     Recent Labs: 06/04/2019: Magnesium 2.1; TSH 0.797 07/08/2019: BUN 18; Creatinine, Ser 1.14; Hemoglobin 14.5; Platelets 197; Potassium 4.3; Sodium 140  Recent Lipid Panel    Component Value Date/Time   CHOL 179 04/04/2016 1540   TRIG 180 (H) 04/04/2016 1540   HDL 47 04/04/2016 1540   CHOLHDL 3.8 04/04/2016 1540   LDLCALC 96 04/04/2016 1540    Physical Exam:     Physical Exam: Blood pressure 122/82, pulse (!) 58, height 5' 8" (1.727 m), weight 201 lb (91.2 kg), SpO2 97 %.  GEN:  Well nourished, well developed in no acute distress HEENT: Normal NECK: No JVD; No carotid bruits LYMPHATICS: No lymphadenopathy CARDIAC: RRR , no murmurs, rubs, gallops RESPIRATORY:  Clear to auscultation without rales, wheezing or rhonchi  ABDOMEN: Soft, non-tender,   non-distended MUSCULOSKELETAL:  No edema; No deformity  SKIN: Warm and dry NEUROLOGIC:  Alert and oriented x 3  EKG:    Feb. 4, 2022:  Sinus brady at  57.  NS ST/ T wave abn.   ASSESSMENT:    1. CAD in native artery   2. Angina pectoris ([place])    PLAN:      1.  Coronary artery disease: She has started having symptoms consistent with unstable angina. Her symptoms consist of chest pressure that typically occurs at night. It radiates through to her back and is relieved with nitroglycerin. Is associated with some shortness of breath.  I like to proceed with a heart catheterization for further evaluation. We have discussed the risks, benefits, options of heart catheterization. She understands and agrees to proceed.  Will hold her Pradaxa  2 days prior to her cath.  Will start ASA 81 mg when she stops the Pradaxa   2   PAF:   Continue Multaq. Continue Pradaxa. We will have to hold the Pradaxa for the heart catheterization but we will restart it after the heart cath.  3.  Hyperlipidemia :   Cont atorvastatin    Medication Adjustments/Labs and Tests Ordered: Current medicines are reviewed at length with the patient today.  Concerns regarding medicines are outlined above.  Orders Placed This Encounter  Procedures  . Basic metabolic panel  . CBC   No orders of the defined types were placed in this encounter.   Patient Instructions  Medication Instructions:  Your physician recommends that you continue on your current medications as directed. Please refer to the Current Medication list given to you today.  *If you need a refill on your cardiac medications before your next appointment, please call your pharmacy*   Lab Work: 2/7- anytime between 7:30am and 4:30pm. (BMET,CBC)   Testing/Procedures: Your physician has requested that you have a cardiac catheterization. Cardiac catheterization is used to diagnose and/or treat various heart conditions. Doctors may recommend this procedure for a number of different reasons. The most common reason is to evaluate chest pain. Chest pain can be a symptom of coronary artery disease (CAD), and  cardiac catheterization can show whether plaque is narrowing or blocking your heart's arteries. This procedure is also used to evaluate the valves, as well as measure the blood flow and oxygen levels in different parts of your heart. For further information please visit [email or web address]. Please follow instruction sheet, as given.    Follow-Up: At [facility], you and your health needs are our priority.  As part of our continuing mission to provide you with exceptional heart care, we have created designated Provider Care Teams.  These Care Teams include your primary Cardiologist (physician) and Advanced Practice Providers (APPs -  Physician Assistants and Nurse Practitioners) who all work together to provide you with the care you need, when you need it.  We recommend signing up for the patient portal called "MyChart".  Sign up information is provided on this After Visit Summary.  MyChart is used to connect with patients for Virtual Visits (Telemedicine).  Patients are able to view lab/test results, encounter notes, upcoming appointments, etc.  Non-urgent messages can be sent to your provider as well.   To learn more about what you can do with MyChart, go to [email or web address].    Your next appointment:   1 month(s)  The format for your next appointment:   In Person  Provider:   You will see one of the following Advanced Practice  Providers on your designated Care Team:    Richardson Dopp, PA-C  Robbie Lis, [place]   Other Instructions    [facility] OFFICE [place], [address] [place] [address] Dept: [phone] Loc: [phone]  Brittany Buckley  04/03/2020  You are scheduled for a Cardiac Catheterization on Thursday, February 10 with Dr. Daneen Schick.  1. Please arrive at the [facility] (Main Entrance A) at [facility]: [address] [place], [place] [address] at 7:00 AM (This  time is two hours before your procedure to ensure your preparation). Free valet parking service is available.   Special note: Every effort is made to have your procedure done on time. Please understand that emergencies sometimes delay scheduled procedures.  2. Diet: Do not eat solid foods after midnight.  The patient may have clear liquids until 5am upon the day of the procedure.  3. Labs: You will need to have blood drawn on today, February 4 at [facility]. [address]  Open: 7:30am - 5pm    Phone: [phone]. You do not need to be fasting.  4. Medication instructions in preparation for your procedure:   Contrast Allergy: No   Stop taking Pradaxa (Dabigatran) on Tuesday, February 8.  Stop taking, Lisinopril (Zestril or Prinivil) Wednesday, February 9,   Do not take Diabetes Med Glucophage (Metformin) on the day of the procedure and HOLD 48 HOURS AFTER THE PROCEDURE.  On the morning of your procedure, take your Aspirin and any morning medicines NOT listed above.  You may use sips of water.  5. Plan for one night stay--bring personal belongings. 6. Bring a current list of your medications and current insurance cards. 7. You MUST have a responsible person to drive you home. 8. Someone MUST be with you the first 24 hours after you arrive home or your discharge will be delayed. 9. Please wear clothes that are easy to get on and off and wear slip-on shoes.  10.COVID screening: 2/7 at 2:15pm  Patient Instructions This is a Drive Up Visit at [address] [place],  [address] Someone will direct you to the appropriate testing line. Stay in your car and someone will be with you shortly.  Thank you for allowing [place] to care for you!   -- [facility] Invasive Cardiovascular services     Signed, Mertie Moores, MD  04/03/2020 5:24 PM    [place]

## 2020-04-02 NOTE — Progress Notes (Signed)
Cardiology Office Note:    Date:  04/03/2020   ID:  [name], DOB March 13, 1955, MRN [id number]  PCP:  Marton Redwood, MD  Cardiologist:  Mertie Moores, MD  Electrophysiologist:  Constance Haw, MD   Referring MD: Marton Redwood, MD   Chief Complaint  Patient presents with  . Coronary Artery Disease    History of Present Illness:    Brittany Buckley is a 65 y.o. female with a hx of  CAD, prior MI (prior stenting in ~2014/2015 in [place] (stents in LCx and RCA based on stable cath in 06/2018), ADHD, asthma, chronic neck/back pain, persistent AFib, HTN, HLD, gastric ulcer, migraines, obesity, DM  She was last seen by me in 2018 for CAD   .  Has been under lots of stress moving into a new home.   Felt her heart racing .   She was seen in the ER on June 04, 2019 for palpitations and was found to have Afib. Took Multaq early  She spontaneously converted back to sinus rhythm.  CHADS2VASC is 4.  She is on pradaxa.  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 She cut her Coreg to 3.125 a day .  No further episodes of CP .  ,  No angina  Is not exercising,  Walks her dog regularly  Is scheduled to see Dr. Brigitte Pulse this summer.   Feb. 4, 2022: Brittany Buckley is seen for follow up of her CAD, Atrial fib, HTN She broke her right ankle in Nov.    Still healing up from that  Has some chest pains - usually at night while at rest , radiates through to her back , pressure like sensation  Takes NTG with relief.  Not all that similar to her MI pain  Has been present intermittantly for 3 months ,  Has not worsened significantly  Does not seem to be exertional     Past Medical History:   Diagnosis Date  . Adult ADHD   . Anxiety   . Arthritis    "hands, feet" (06/14/2016)  . Asthma   . Atrial fibrillation ([place])   . CAD (coronary artery disease)   . Chronic lower back pain   . Chronic neck pain   . Coronary artery disease    a. history of MI and prior stenting in ~2014/2015 in [place] (stents in LCx and RCA based on stable cath in 06/2018).  . Diabetes mellitus without complication ([place])   . Gastric ulcer   . GERD (gastroesophageal reflux disease)   . Headache    "monthly" (06/14/2016)  . Heart murmur   . High cholesterol   . History of blood transfusion 1983   Bleed after C-Section  . Hyperlipidemia   . Hypertension   . MI (myocardial infarction) ([place]) 2014; 2015  . Migraine    "probably twice/year" (06/14/2016)  . Myocardial infarction ([place])   . Numbness of toes   . Obesity   . On anticoagulant therapy   . Peripheral neuropathy   . Persistent atrial fibrillation ([place])   . Pneumonia 1990s  . Thyroid nodule   . Type II diabetes mellitus ([place])   . Urticaria  Past Surgical History:  Procedure Laterality Date  . ABDOMINAL HYSTERECTOMY    . ADENOIDECTOMY    . BIOPSY THYROID    . BREAST CYST EXCISION Left 01/21/2019   Procedure: EXCISION OF LEFT BREAST MASS;  Surgeon: Jovita Kussmaul, MD;  Location: [facility];  Service: General;  Laterality: Left;  . BREAST EXCISIONAL BIOPSY Right   . CESAREAN SECTION    . CORONARY ANGIOPLASTY WITH STENT PLACEMENT  2014 & 2015  . CORONARY ANGIOPLASTY WITH STENT PLACEMENT    . LEFT HEART CATH AND CORONARY ANGIOGRAPHY N/A 07/09/2018   Procedure: LEFT HEART CATH AND CORONARY ANGIOGRAPHY;  Surgeon: Belva Crome, MD;  Location: [facility];  Service: Cardiovascular;  Laterality: N/A;  . TONSILLECTOMY    . TONSILLECTOMY AND ADENOIDECTOMY    . TRIGGER FINGER RELEASE Right    "pointer"    Current Medications: Current Meds  Medication Sig  . acetaminophen (TYLENOL) 500 MG tablet Take 1,000 mg by mouth every 6 (six)  hours as needed for headache (pain).  Marland Kitchen acetaminophen (TYLENOL) 650 MG CR tablet Take 1,300 mg by mouth as needed for pain.   Marland Kitchen albuterol (VENTOLIN HFA) 108 (90 Base) MCG/ACT inhaler Inhale 2 puffs into the lungs every 6 (six) hours as needed for wheezing or shortness of breath.   Marland Kitchen atorvastatin (LIPITOR) 80 MG tablet Take 80 mg by mouth daily.  Marland Kitchen buPROPion (WELLBUTRIN XL) 150 MG 24 hr tablet Take 150 mg by mouth daily.  . carvedilol (COREG) 3.125 MG tablet Take 3.125 mg by mouth daily.  . diphenhydrAMINE (BENADRYL) 25 MG tablet Take 25 mg by mouth every 6 (six) hours as needed for itching or allergies.   Marland Kitchen dronedarone (MULTAQ) 400 MG tablet Take 1 tablet (400 mg total) by mouth 2 (two) times daily with a meal.  . hydrocortisone cream 1 % Apply topically as needed for itching.  . isosorbide mononitrate (IMDUR) 30 MG 24 hr tablet Take 30 mg by mouth daily.  Marland Kitchen JARDIANCE 25 MG TABS tablet Take 25 mg by mouth daily.  Marland Kitchen lisinopril (ZESTRIL) 40 MG tablet Take 1 tablet (40 mg total) by mouth daily.  . metFORMIN (GLUCOPHAGE) 1000 MG tablet Take 1,000 mg by mouth 2 (two) times daily.  . nitroGLYCERIN (NITROSTAT) 0.4 MG SL tablet Place 0.4 mg under the tongue every 5 (five) minutes as needed for chest pain.   Marland Kitchen omeprazole (PRILOSEC) 40 MG capsule Take 40 mg by mouth daily as needed (acid reflux).   Marland Kitchen PRADAXA 150 MG CAPS capsule TAKE ONE CAPSULE BY MOUTH TWICE DAILY  . [DISCONTINUED] albuterol (PROVENTIL HFA;VENTOLIN HFA) 108 (90 Base) MCG/ACT inhaler Inhale 1-2 puffs into the lungs every 6 (six) hours as needed for wheezing or shortness of breath.  . [DISCONTINUED] atorvastatin (LIPITOR) 80 MG tablet TAKE 1 TABLET(80 MG) BY MOUTH DAILY  . [DISCONTINUED] buPROPion (WELLBUTRIN XL) 150 MG 24 hr tablet Take 150 mg by mouth daily.  . [DISCONTINUED] dabigatran (PRADAXA) 150 MG CAPS capsule Take 150 mg by mouth 2 (two) times daily.  . [DISCONTINUED] dronedarone (MULTAQ) 400 MG tablet Take 400 mg by mouth 2 (two)  times daily.  . [DISCONTINUED] empagliflozin (JARDIANCE) 25 MG TABS tablet Take 25 mg by mouth daily.  . [DISCONTINUED] isosorbide mononitrate (IMDUR) 30 MG 24 hr tablet Take 1 tablet (30 mg total) by mouth daily.  . [DISCONTINUED] metFORMIN (GLUCOPHAGE) 1000 MG tablet Take 1,000 mg by mouth 2 (two) times daily with a meal.   . [DISCONTINUED] mupirocin  ointment (BACTROBAN) 2 % Apply 1 application topically 2 (two) times daily.  . [DISCONTINUED] nitroGLYCERIN (NITROSTAT) 0.4 MG SL tablet Place 1 tablet (0.4 mg total) under the tongue every 5 (five) minutes as needed for chest pain.  . [DISCONTINUED] omeprazole (PRILOSEC) 40 MG capsule TAKE 1 CAPSULE(40 MG) BY MOUTH TWICE DAILY  . [DISCONTINUED] oxyCODONE (ROXICODONE) 5 MG immediate release tablet Take 1 tablet (5 mg total) by mouth every 4 (four) hours as needed for severe pain.  . [DISCONTINUED] Semaglutide, 1 MG/DOSE, (OZEMPIC, 1 MG/DOSE,) 4 MG/3ML SOPN Inject 1 mg into the skin every Saturday.     Allergies:   Codeine, Codeine, Onion, Tomato, Tomato, Chocolate, Chocolate, Demerol [meperidine hcl], and Demerol [meperidine]   Social History   Socioeconomic History  . Marital status: Married    Spouse name: Not on file  . Number of children: Not on file  . Years of education: Not on file  . Highest education level: Not on file  Occupational History  . Not on file  Tobacco Use  . Smoking status: Never Smoker  . Smokeless tobacco: Never Used  Substance and Sexual Activity  . Alcohol use: Not Currently  . Drug use: Not Currently  . Sexual activity: Yes    Comment: married  Other Topics Concern  . Not on file  Social History Narrative   ** Merged History Encounter **       Social Determinants of Health   Financial Resource Strain: Not on file  Food Insecurity: Not on file  Transportation Needs: Not on file  Physical Activity: Not on file  Stress: Not on file  Social Connections: Not on file     Family History: The patient's  family history includes CAD in her father; Cancer - Ovarian in her sister; Colon polyps in her mother; Diabetes in her maternal grandmother and paternal grandfather; Healthy in her son; Heart attack in her father; Heart disease in her paternal grandmother; Hyperlipidemia in her mother; Obesity in her daughter.  ROS:   Please see the history of present illness.     All other systems reviewed and are negative.  EKGs/Labs/Other Studies Reviewed:    The following studies were reviewed today:  Physical Exam: Blood pressure 122/82, pulse (!) 58, height 5\' 8"  (1.727 m), weight 201 lb (91.2 kg), SpO2 97 %.  GEN:  Well nourished, well developed in no acute distress HEENT: Normal NECK: No JVD; No carotid bruits LYMPHATICS: No lymphadenopathy CARDIAC: RRR , no murmurs, rubs, gallops RESPIRATORY:  Clear to auscultation without rales, wheezing or rhonchi  ABDOMEN: Soft, non-tender, non-distended MUSCULOSKELETAL:  No edema; No deformity  SKIN: Warm and dry NEUROLOGIC:  Alert and oriented x 3     Recent Labs: 06/04/2019: Magnesium 2.1; TSH 0.797 07/08/2019: BUN 18; Creatinine, Ser 1.14; Hemoglobin 14.5; Platelets 197; Potassium 4.3; Sodium 140  Recent Lipid Panel    Component Value Date/Time   CHOL 179 04/04/2016 1540   TRIG 180 (H) 04/04/2016 1540   HDL 47 04/04/2016 1540   CHOLHDL 3.8 04/04/2016 1540   LDLCALC 96 04/04/2016 1540    Physical Exam:     Physical Exam: Blood pressure 122/82, pulse (!) 58, height 5\' 8"  (1.727 m), weight 201 lb (91.2 kg), SpO2 97 %.  GEN:  Well nourished, well developed in no acute distress HEENT: Normal NECK: No JVD; No carotid bruits LYMPHATICS: No lymphadenopathy CARDIAC: RRR , no murmurs, rubs, gallops RESPIRATORY:  Clear to auscultation without rales, wheezing or rhonchi  ABDOMEN: Soft, non-tender,  non-distended MUSCULOSKELETAL:  No edema; No deformity  SKIN: Warm and dry NEUROLOGIC:  Alert and oriented x 3  EKG:    Feb. 4, 2022:  Sinus brady at  57.  NS ST/ T wave abn.   ASSESSMENT:    1. CAD in native artery   2. Angina pectoris ([place])    PLAN:      1.  Coronary artery disease: She has started having symptoms consistent with unstable angina. Her symptoms consist of chest pressure that typically occurs at night. It radiates through to her back and is relieved with nitroglycerin. Is associated with some shortness of breath.  I like to proceed with a heart catheterization for further evaluation. We have discussed the risks, benefits, options of heart catheterization. She understands and agrees to proceed.  Will hold her Pradaxa  2 days prior to her cath.  Will start ASA 81 mg when she stops the Pradaxa   2   PAF:   Continue Multaq. Continue Pradaxa. We will have to hold the Pradaxa for the heart catheterization but we will restart it after the heart cath.  3.  Hyperlipidemia :   Cont atorvastatin    Medication Adjustments/Labs and Tests Ordered: Current medicines are reviewed at length with the patient today.  Concerns regarding medicines are outlined above.  Orders Placed This Encounter  Procedures  . Basic metabolic panel  . CBC   No orders of the defined types were placed in this encounter.   Patient Instructions  Medication Instructions:  Your physician recommends that you continue on your current medications as directed. Please refer to the Current Medication list given to you today.  *If you need a refill on your cardiac medications before your next appointment, please call your pharmacy*   Lab Work: 2/7- anytime between 7:30am and 4:30pm. (BMET,CBC)   Testing/Procedures: Your physician has requested that you have a cardiac catheterization. Cardiac catheterization is used to diagnose and/or treat various heart conditions. Doctors may recommend this procedure for a number of different reasons. The most common reason is to evaluate chest pain. Chest pain can be a symptom of coronary artery disease (CAD), and  cardiac catheterization can show whether plaque is narrowing or blocking your heart's arteries. This procedure is also used to evaluate the valves, as well as measure the blood flow and oxygen levels in different parts of your heart. For further information please visit [email or web address].[email or web address]. Please follow instruction sheet, as given.    Follow-Up: At [facility], you and your health needs are our priority.  As part of our continuing mission to provide you with exceptional heart care, we have created designated Provider Care Teams.  These Care Teams include your primary Cardiologist (physician) and Advanced Practice Providers (APPs -  Physician Assistants and Nurse Practitioners) who all work together to provide you with the care you need, when you need it.  We recommend signing up for the patient portal called "MyChart".  Sign up information is provided on this After Visit Summary.  MyChart is used to connect with patients for Virtual Visits (Telemedicine).  Patients are able to view lab/test results, encounter notes, upcoming appointments, etc.  Non-urgent messages can be sent to your provider as well.   To learn more about what you can do with MyChart, go to [email or web address].    Your next appointment:   1 month(s)  The format for your next appointment:   In Person  Provider:   You will see one of the following Advanced Practice  Providers on your designated Care Team:    Richardson Dopp, PA-C  Robbie Lis, [place]   Other Instructions    [place] OFFICE [place], [place] [address] Dept: [phone] Loc: [phone]  Brittany Buckley  04/03/2020  You are scheduled for a Cardiac Catheterization on Thursday, February 10 with Dr. Daneen Schick.  1. Please arrive at the [facility] (Main Entrance A) at [facility]: [address] [place], [place] [address] at 7:00 AM (This  time is two hours before your procedure to ensure your preparation). Free valet parking service is available.   Special note: Every effort is made to have your procedure done on time. Please understand that emergencies sometimes delay scheduled procedures.  2. Diet: Do not eat solid foods after midnight.  The patient may have clear liquids until 5am upon the day of the procedure.  3. Labs: You will need to have blood drawn on today, February 4 at [facility] at [facility]. [address]  Open: 7:30am - 5pm    Phone: [phone]. You do not need to be fasting.  4. Medication instructions in preparation for your procedure:   Contrast Allergy: No   Stop taking Pradaxa (Dabigatran) on Tuesday, February 8.  Stop taking, Lisinopril (Zestril or Prinivil) Wednesday, February 9,   Do not take Diabetes Med Glucophage (Metformin) on the day of the procedure and HOLD 48 HOURS AFTER THE PROCEDURE.  On the morning of your procedure, take your Aspirin and any morning medicines NOT listed above.  You may use sips of water.  5. Plan for one night stay--bring personal belongings. 6. Bring a current list of your medications and current insurance cards. 7. You MUST have a responsible person to drive you home. 8. Someone MUST be with you the first 24 hours after you arrive home or your discharge will be delayed. 9. Please wear clothes that are easy to get on and off and wear slip-on shoes.  10.COVID screening: 2/7 at 2:15pm  Patient Instructions This is a Drive Up Visit at [address] [place],  [address] Someone will direct you to the appropriate testing line. Stay in your car and someone will be with you shortly.  Thank you for allowing [place] to care for you!   -- [facility] Invasive Cardiovascular services     Signed, Mertie Moores, MD  04/03/2020 5:24 PM    [place]

## 2020-04-03 ENCOUNTER — Other Ambulatory Visit: Payer: Self-pay

## 2020-04-03 ENCOUNTER — Ambulatory Visit (INDEPENDENT_AMBULATORY_CARE_PROVIDER_SITE_OTHER): Payer: Commercial Managed Care - PPO | Admitting: Cardiovascular Disease

## 2020-04-03 ENCOUNTER — Encounter: Payer: Self-pay | Admitting: Cardiovascular Disease

## 2020-04-03 VITALS — BP 122/82 | HR 58 | Ht 68.0 in | Wt 201.0 lb

## 2020-04-03 DIAGNOSIS — I2510 Atherosclerotic heart disease of native coronary artery without angina pectoris: Secondary | ICD-10-CM | POA: Diagnosis not present

## 2020-04-03 DIAGNOSIS — I209 Angina pectoris, unspecified: Secondary | ICD-10-CM | POA: Diagnosis not present

## 2020-04-03 NOTE — Patient Instructions (Addendum)
Medication Instructions:  Your physician recommends that you continue on your current medications as directed. Please refer to the Current Medication list given to you today.  *If you need a refill on your cardiac medications before your next appointment, please call your pharmacy*   Lab Work: 2/7- anytime between 7:30am and 4:30pm. (BMET,CBC)   Testing/Procedures: Your physician has requested that you have a cardiac catheterization. Cardiac catheterization is used to diagnose and/or treat various heart conditions. Doctors may recommend this procedure for a number of different reasons. The most common reason is to evaluate chest pain. Chest pain can be a symptom of coronary artery disease (CAD), and cardiac catheterization can show whether plaque is narrowing or blocking your heart's arteries. This procedure is also used to evaluate the valves, as well as measure the blood flow and oxygen levels in different parts of your heart. For further information please visit HugeFiesta.[email or web address]. Please follow instruction sheet, as given.    Follow-Up: At [facility], you and your health needs are our priority.  As part of our continuing mission to provide you with exceptional heart care, we have created designated Provider Care Teams.  These Care Teams include your primary Cardiologist (physician) and Advanced Practice Providers (APPs -  Physician Assistants and Nurse Practitioners) who all work together to provide you with the care you need, when you need it.  We recommend signing up for the patient portal called "MyChart".  Sign up information is provided on this After Visit Summary.  MyChart is used to connect with patients for Virtual Visits (Telemedicine).  Patients are able to view lab/test results, encounter notes, upcoming appointments, etc.  Non-urgent messages can be sent to your provider as well.   To learn more about what you can do with MyChart, go to [email or web address].    Your next  appointment:   1 month(s)  The format for your next appointment:   In Person  Provider:   You will see one of the following Advanced Practice Providers on your designated Care Team:    Richardson Dopp, PA-C  Robbie Lis, [place]   Other Instructions    [place] OFFICE [place], [place] [address] Dept: [phone] Loc: [phone]  Korena Nass  04/03/2020  You are scheduled for a Cardiac Catheterization on Thursday, February 10 with Dr. Daneen Schick.  1. Please arrive at the [facility] (Main Entrance A) at [facility]: [address] [place], [place] [address] at 7:00 AM (This time is two hours before your procedure to ensure your preparation). Free valet parking service is available.   Special note: Every effort is made to have your procedure done on time. Please understand that emergencies sometimes delay scheduled procedures.  2. Diet: Do not eat solid foods after midnight.  The patient may have clear liquids until 5am upon the day of the procedure.  3. Labs: You will need to have blood drawn on today, February 4 at [facility] at [facility] [address]  Open: 7:30am - 5pm    Phone: [phone]. You do not need to be fasting.  4. Medication instructions in preparation for your procedure:   Contrast Allergy: No   Stop taking Pradaxa (Dabigatran) on Tuesday, February 8.  Stop taking, Lisinopril (Zestril or Prinivil) Wednesday, February 9,   Do not take Diabetes Med Glucophage (Metformin) on the day of the procedure and HOLD 48 HOURS AFTER  THE PROCEDURE.  On the morning of your procedure, take your Aspirin and any morning medicines NOT listed above.  You may use sips of water.  5. Plan for one night stay--bring personal belongings. 6. Bring a current list of your medications and current insurance cards. 7. You MUST have a  responsible person to drive you home. 8. Someone MUST be with you the first 24 hours after you arrive home or your discharge will be delayed. 9. Please wear clothes that are easy to get on and off and wear slip-on shoes.  10.COVID screening: 2/7 at 2:15pm  Patient Instructions This is a Drive Up Visit at [address] [place], [place] [address] Someone will direct you to the appropriate testing line. Stay in your car and someone will be with you shortly.  Thank you for allowing [place] to care for you!   -- [place] Invasive Cardiovascular services

## 2020-04-04 ENCOUNTER — Other Ambulatory Visit (HOSPITAL_COMMUNITY): Payer: Commercial Managed Care - PPO

## 2020-04-06 ENCOUNTER — Other Ambulatory Visit (HOSPITAL_COMMUNITY): Payer: Commercial Managed Care - PPO

## 2020-04-06 ENCOUNTER — Other Ambulatory Visit: Payer: Commercial Managed Care - PPO

## 2020-04-07 ENCOUNTER — Other Ambulatory Visit: Payer: Commercial Managed Care - PPO

## 2020-04-07 ENCOUNTER — Telehealth: Payer: Self-pay | Admitting: *Deleted

## 2020-04-07 ENCOUNTER — Other Ambulatory Visit: Payer: Self-pay

## 2020-04-07 ENCOUNTER — Telehealth: Payer: Self-pay | Admitting: Physician Assistant

## 2020-04-07 DIAGNOSIS — I2510 Atherosclerotic heart disease of native coronary artery without angina pectoris: Secondary | ICD-10-CM

## 2020-04-07 DIAGNOSIS — I209 Angina pectoris, unspecified: Secondary | ICD-10-CM

## 2020-04-07 NOTE — Telephone Encounter (Signed)
I spoke with patient, she forgot to get lab yesterday, will go by [facility] lab this afternoon- BMP/CBC.

## 2020-04-07 NOTE — Telephone Encounter (Addendum)
[facility] 04/09/20-Doesn't look like patient has had pre-procedure lab-call placed to patient to discuss, left VM message for patient.

## 2020-04-07 NOTE — Telephone Encounter (Signed)
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

## 2020-04-07 NOTE — Addendum Note (Signed)
Addended by: Georgiann Cocker on: 04/07/2020 02:33 PM   Modules accepted: Orders

## 2020-04-07 NOTE — Telephone Encounter (Signed)
Patient was returning phone call 

## 2020-04-07 NOTE — Telephone Encounter (Signed)
See other phone note today, 04/07/20.

## 2020-04-08 ENCOUNTER — Other Ambulatory Visit: Payer: Commercial Managed Care - PPO

## 2020-04-08 LAB — CBC
Hematocrit: 43.1 % (ref 34.0–46.6)
Hemoglobin: 14 g/dL (ref 11.1–15.9)
MCH: 28.9 pg (ref 26.6–33.0)
MCHC: 32.5 g/dL (ref 31.5–35.7)
MCV: 89 fL (ref 79–97)
Platelets: 211 10*3/uL (ref 150–450)
RBC: 4.84 x10E6/uL (ref 3.77–5.28)
RDW: 14.4 % (ref 11.7–15.4)
WBC: 7 10*3/uL (ref 3.4–10.8)

## 2020-04-08 LAB — BASIC METABOLIC PANEL
BUN/Creatinine Ratio: 19 (ref 12–28)
BUN: 15 mg/dL (ref 8–27)
CO2: 19 mmol/L — ABNORMAL LOW (ref 20–29)
Calcium: 9.4 mg/dL (ref 8.7–10.3)
Chloride: 106 mmol/L (ref 96–106)
Creatinine, Ser: 0.78 mg/dL (ref 0.57–1.00)
GFR calc Af Amer: 94 mL/min/{1.73_m2} (ref >59)
GFR calc non Af Amer: 81 mL/min/{1.73_m2} (ref >59)
Glucose: 86 mg/dL (ref 65–99)
Potassium: 4.4 mmol/L (ref 3.5–5.2)
Sodium: 143 mmol/L (ref 134–144)

## 2020-04-08 NOTE — Progress Notes (Signed)
Attempted to call patient regarding cath instructions for tomorrow.  Left voice mail for patient to arrive @ 0600, patient needs Covid test on arrival.  Nothing to eat or drink after midnight, ok to take morning medications morning of procedure except the diabetes meds to hold those.  She should have been holding Pradaxa for 2 days.  She will also need responsible adult to drive her home as well as stay with her for 24 hours

## 2020-04-09 ENCOUNTER — Ambulatory Visit (HOSPITAL_COMMUNITY)
Admission: RE | Admit: 2020-04-09 | Discharge: 2020-04-09 | Disposition: A | Discharge status: Home / Self Care | Payer: Commercial Managed Care - PPO | Attending: Interventional Cardiology | Admitting: Interventional Cardiology

## 2020-04-09 ENCOUNTER — Encounter (HOSPITAL_COMMUNITY)
Admission: RE | Disposition: A | Discharge status: Home / Self Care | Payer: Self-pay | Source: Home / Self Care | Attending: Interventional Cardiology

## 2020-04-09 DIAGNOSIS — Z79899 Other long term (current) drug therapy: Secondary | ICD-10-CM | POA: Diagnosis not present

## 2020-04-09 DIAGNOSIS — Z885 Allergy status to narcotic agent status: Secondary | ICD-10-CM | POA: Diagnosis not present

## 2020-04-09 DIAGNOSIS — Z20822 Contact with and (suspected) exposure to covid-19: Secondary | ICD-10-CM | POA: Insufficient documentation

## 2020-04-09 DIAGNOSIS — I25118 Atherosclerotic heart disease of native coronary artery with other forms of angina pectoris: Secondary | ICD-10-CM

## 2020-04-09 DIAGNOSIS — Z7901 Long term (current) use of anticoagulants: Secondary | ICD-10-CM | POA: Diagnosis not present

## 2020-04-09 DIAGNOSIS — E7800 Pure hypercholesterolemia, unspecified: Secondary | ICD-10-CM | POA: Diagnosis present

## 2020-04-09 DIAGNOSIS — Z7984 Long term (current) use of oral hypoglycemic drugs: Secondary | ICD-10-CM | POA: Insufficient documentation

## 2020-04-09 DIAGNOSIS — I25110 Atherosclerotic heart disease of native coronary artery with unstable angina pectoris: Secondary | ICD-10-CM | POA: Diagnosis not present

## 2020-04-09 DIAGNOSIS — I209 Angina pectoris, unspecified: Secondary | ICD-10-CM | POA: Diagnosis present

## 2020-04-09 DIAGNOSIS — I2510 Atherosclerotic heart disease of native coronary artery without angina pectoris: Secondary | ICD-10-CM

## 2020-04-09 DIAGNOSIS — I4819 Other persistent atrial fibrillation: Secondary | ICD-10-CM | POA: Insufficient documentation

## 2020-04-09 DIAGNOSIS — I4891 Unspecified atrial fibrillation: Secondary | ICD-10-CM | POA: Diagnosis present

## 2020-04-09 DIAGNOSIS — Z955 Presence of coronary angioplasty implant and graft: Secondary | ICD-10-CM | POA: Diagnosis not present

## 2020-04-09 DIAGNOSIS — R011 Cardiac murmur, unspecified: Secondary | ICD-10-CM | POA: Diagnosis present

## 2020-04-09 DIAGNOSIS — Z9102 Food additives allergy status: Secondary | ICD-10-CM | POA: Insufficient documentation

## 2020-04-09 DIAGNOSIS — E785 Hyperlipidemia, unspecified: Secondary | ICD-10-CM | POA: Diagnosis not present

## 2020-04-09 DIAGNOSIS — E119 Type 2 diabetes mellitus without complications: Secondary | ICD-10-CM

## 2020-04-09 HISTORY — PX: LEFT HEART CATH AND CORONARY ANGIOGRAPHY: CATH118249

## 2020-04-09 LAB — GLUCOSE, CAPILLARY
Glucose-Capillary: 114 mg/dL — ABNORMAL HIGH (ref 70–99)
Glucose-Capillary: 103 mg/dL — ABNORMAL HIGH (ref 70–99)

## 2020-04-09 LAB — SARS CORONAVIRUS 2 BY RT PCR (HOSPITAL ORDER, PERFORMED IN ~~LOC~~ HOSPITAL LAB): SARS Coronavirus 2: NEGATIVE

## 2020-04-09 SURGERY — LEFT HEART CATH AND CORONARY ANGIOGRAPHY
Anesthesia: LOCAL

## 2020-04-09 MED ORDER — ASPIRIN 81 MG PO CHEW: 81.0000 mg | CHEWABLE_TABLET | ORAL | Status: DC

## 2020-04-09 MED ORDER — HEPARIN SODIUM (PORCINE) 1000 UNIT/ML IJ SOLN
INTRAMUSCULAR | Status: AC
  Filled 2020-04-09: qty 1

## 2020-04-09 MED ORDER — SODIUM CHLORIDE 0.9 % IV SOLN: 250.0000 mL | INTRAVENOUS | Status: DC | PRN

## 2020-04-09 MED ORDER — SODIUM CHLORIDE 0.9% FLUSH: 3.0000 mL | Freq: Two times a day (BID) | INTRAVENOUS | Status: DC

## 2020-04-09 MED ORDER — ACETAMINOPHEN 325 MG PO TABS: 650.0000 mg | ORAL_TABLET | ORAL | Status: DC | PRN

## 2020-04-09 MED ORDER — VERAPAMIL HCL 2.5 MG/ML IV SOLN
INTRAVENOUS | Status: DC | PRN
  Administered 2020-04-09: 10 mL via INTRA_ARTERIAL

## 2020-04-09 MED ORDER — LABETALOL HCL 5 MG/ML IV SOLN: 10.0000 mg | INTRAVENOUS | Status: DC | PRN

## 2020-04-09 MED ORDER — FENTANYL CITRATE (PF) 100 MCG/2ML IJ SOLN
INTRAMUSCULAR | Status: DC | PRN
  Administered 2020-04-09: 50 ug via INTRAVENOUS

## 2020-04-09 MED ORDER — SODIUM CHLORIDE 0.9% FLUSH: 3.0000 mL | INTRAVENOUS | Status: DC | PRN

## 2020-04-09 MED ORDER — ONDANSETRON HCL 4 MG/2ML IJ SOLN: 4.0000 mg | Freq: Four times a day (QID) | INTRAMUSCULAR | Status: DC | PRN

## 2020-04-09 MED ORDER — MIDAZOLAM HCL 2 MG/2ML IJ SOLN
INTRAMUSCULAR | Status: AC
  Filled 2020-04-09: qty 2

## 2020-04-09 MED ORDER — SODIUM CHLORIDE 0.9 % WEIGHT BASED INFUSION: 1.0000 mL/kg/h | INTRAVENOUS | Status: DC

## 2020-04-09 MED ORDER — SODIUM CHLORIDE 0.9 % WEIGHT BASED INFUSION
3.0000 mL/kg/h | INTRAVENOUS | Status: AC
  Administered 2020-04-09: 3 mL/kg/h via INTRAVENOUS

## 2020-04-09 MED ORDER — VERAPAMIL HCL 2.5 MG/ML IV SOLN
INTRAVENOUS | Status: DC | PRN
  Administered 2020-04-09: 2 mg via INTRAVENOUS

## 2020-04-09 MED ORDER — HEPARIN SODIUM (PORCINE) 1000 UNIT/ML IJ SOLN
INTRAMUSCULAR | Status: DC | PRN
  Administered 2020-04-09: 4500 [IU] via INTRAVENOUS

## 2020-04-09 MED ORDER — HEPARIN (PORCINE) IN NACL 1000-0.9 UT/500ML-% IV SOLN
INTRAVENOUS | Status: DC | PRN
  Administered 2020-04-09 (×2): 500 mL

## 2020-04-09 MED ORDER — VERAPAMIL HCL 2.5 MG/ML IV SOLN
INTRAVENOUS | Status: AC
  Filled 2020-04-09: qty 2

## 2020-04-09 MED ORDER — MIDAZOLAM HCL 2 MG/2ML IJ SOLN
INTRAMUSCULAR | Status: DC | PRN
  Administered 2020-04-09 (×2): 1 mg via INTRAVENOUS

## 2020-04-09 MED ORDER — HYDRALAZINE HCL 20 MG/ML IJ SOLN: 10.0000 mg | INTRAMUSCULAR | Status: DC | PRN

## 2020-04-09 MED ORDER — HEPARIN (PORCINE) IN NACL 1000-0.9 UT/500ML-% IV SOLN
INTRAVENOUS | Status: AC
  Filled 2020-04-09: qty 1000

## 2020-04-09 MED ORDER — SODIUM CHLORIDE 0.9 % IV SOLN: INTRAVENOUS | Status: DC

## 2020-04-09 MED ORDER — LIDOCAINE HCL (PF) 1 % IJ SOLN
INTRAMUSCULAR | Status: DC | PRN
  Administered 2020-04-09: 5 mL

## 2020-04-09 MED ORDER — FENTANYL CITRATE (PF) 100 MCG/2ML IJ SOLN
INTRAMUSCULAR | Status: AC
  Filled 2020-04-09: qty 2

## 2020-04-09 MED ORDER — IOHEXOL 350 MG/ML SOLN
INTRAVENOUS | Status: DC | PRN
  Administered 2020-04-09: 70 mL

## 2020-04-09 MED ORDER — LIDOCAINE HCL (PF) 1 % IJ SOLN
INTRAMUSCULAR | Status: AC
  Filled 2020-04-09: qty 30

## 2020-04-09 SURGICAL SUPPLY — 12 items
BAG SNAP BAND KOVER 36X36 (MISCELLANEOUS) ×2 IMPLANT
CATH 5FR JL3.5 JR4 ANG PIG MP (CATHETERS) ×2 IMPLANT
COVER DOME SNAP 22 D (MISCELLANEOUS) ×2 IMPLANT
DEVICE RAD TR BAND REGULAR (VASCULAR PRODUCTS) ×2 IMPLANT
GLIDESHEATH SLEND A-KIT 6F 22G (SHEATH) ×2 IMPLANT
GUIDEWIRE INQWIRE 1.5J.035X260 (WIRE) ×1 IMPLANT
INQWIRE 1.5J .035X260CM (WIRE) ×2
KIT HEART LEFT (KITS) ×2 IMPLANT
PACK CARDIAC CATHETERIZATION (CUSTOM PROCEDURE TRAY) ×2 IMPLANT
SHEATH PROBE COVER 6X72 (BAG) ×2 IMPLANT
TRANSDUCER W/STOPCOCK (MISCELLANEOUS) ×2 IMPLANT
TUBING CIL FLEX 10 FLL-RA (TUBING) ×2 IMPLANT

## 2020-04-09 NOTE — Discharge Instructions (Signed)
Resume Pradaxa at 7 PM tonight  Radial Site Care  This sheet gives you information about how to care for yourself after your procedure. Your health care provider may also give you more specific instructions. If you have problems or questions, contact your health care provider. What can I expect after the procedure? After the procedure, it is common to have:  Bruising and tenderness at the catheter insertion area. Follow these instructions at home: Medicines  Take over-the-counter and prescription medicines only as told by your health care provider. Insertion site care  Follow instructions from your health care provider about how to take care of your insertion site. Make sure you: ? Wash your hands with soap and water before you change your bandage (dressing). If soap and water are not available, use hand sanitizer. ? Change your dressing as told by your health care provider. ? Leave stitches (sutures), skin glue, or adhesive strips in place. These skin closures may need to stay in place for 2 weeks or longer. If adhesive strip edges start to loosen and curl up, you may trim the loose edges. Do not remove adhesive strips completely unless your health care provider tells you to do that.  Check your insertion site every day for signs of infection. Check for: ? Redness, swelling, or pain. ? Fluid or blood. ? Pus or a bad smell. ? Warmth.  Do not take baths, swim, or use a hot tub until your health care provider approves.  You may shower 24-48 hours after the procedure, or as directed by your health care provider. ? Remove the dressing and gently wash the site with plain soap and water. ? Pat the area dry with a clean towel. ? Do not rub the site. That could cause bleeding.  Do not apply powder or lotion to the site. Activity  For 24 hours after the procedure, or as directed by your health care provider: ? Do not flex or bend the affected arm. ? Do not push or pull heavy objects  with the affected arm. ? Do not drive yourself home from the hospital or clinic. You may drive 24 hours after the procedure unless your health care provider tells you not to. ? Do not operate machinery or power tools.  Do not lift anything that is heavier than 10 lb (4.5 kg), or the limit that you are told, until your health care provider says that it is safe.  Ask your health care provider when it is okay to: ? Return to work or school. ? Resume usual physical activities or sports. ? Resume sexual activity.   General instructions  If the catheter site starts to bleed, raise your arm and put firm pressure on the site. If the bleeding does not stop, get help right away. This is a medical emergency.  If you went home on the same day as your procedure, a responsible adult should be with you for the first 24 hours after you arrive home.  Keep all follow-up visits as told by your health care provider. This is important. Contact a health care provider if:  You have a fever.  You have redness, swelling, or yellow drainage around your insertion site. Get help right away if:  You have unusual pain at the radial site.  The catheter insertion area swells very fast.  The insertion area is bleeding, and the bleeding does not stop when you hold steady pressure on the area.  Your arm or hand becomes pale, cool, tingly,  or numb. These symptoms may represent a serious problem that is an emergency. Do not wait to see if the symptoms will go away. Get medical help right away. Call your local emergency services (911 in the [place] Do not drive yourself to the hospital. Summary  After the procedure, it is common to have bruising and tenderness at the site.  Follow instructions from your health care provider about how to take care of your radial site wound. Check the wound every day for signs of infection.  Do not lift anything that is heavier than 10 lb (4.5 kg), or the limit that you are told, until  your health care provider says that it is safe. This information is not intended to replace advice given to you by your health care provider. Make sure you discuss any questions you have with your health care provider. Document Revised: 03/22/2017 Document Reviewed: 03/22/2017 Elsevier Patient Education  2021 [employer].

## 2020-04-09 NOTE — Interval H&P Note (Signed)
Cath Lab Visit (complete for each Cath Lab visit)  Clinical Evaluation Leading to the Procedure:   ACS: Yes.    Non-ACS:    Anginal Classification: CCS III  Anti-ischemic medical therapy: Maximal Therapy (2 or more classes of medications)  Non-Invasive Test Results: No non-invasive testing performed  Prior CABG: No previous CABG      History and Physical Interval Note:  04/09/2020 8:46 AM  Brittany Buckley  has presented today for surgery, with the diagnosis of CAD.  The various methods of treatment have been discussed with the patient and family. After consideration of risks, benefits and other options for treatment, the patient has consented to  Procedure(s): LEFT HEART CATH AND CORONARY ANGIOGRAPHY (N/A) as a surgical intervention.  The patient's history has been reviewed, patient examined, no change in status, stable for surgery.  I have reviewed the patient's chart and labs.  Questions were answered to the patient's satisfaction.     Belva Crome III

## 2020-04-09 NOTE — CV Procedure (Addendum)
   Circumflex and RCA stents.  Luminal irregularities in LAD.  30 to 40% mid circumflex proximal to the distal stent.  First obtuse marginal is small and contains 65% stenosis.  40% RCA distal to the RCA stent.  Normal LV function.  LVEDP is mildly elevated.  When compared to 2019 images, right coronary appears improved and small first obtuse marginal has progressed but is not significant.

## 2020-04-09 NOTE — Progress Notes (Signed)
TRB removed, gauze with tegaderm placed, armboard placed, site unremarkable.  Will continue to monitor

## 2020-04-09 NOTE — Progress Notes (Signed)
Discharge instructions discussed and reviewed with pt and Aaron Edelman (spoue) via phone conference.  Both verbalized understanding

## 2020-04-10 ENCOUNTER — Encounter (HOSPITAL_COMMUNITY): Payer: Self-pay | Admitting: Interventional Cardiology

## 2020-04-10 ENCOUNTER — Ambulatory Visit (HOSPITAL_BASED_OUTPATIENT_CLINIC_OR_DEPARTMENT_OTHER): Payer: Commercial Managed Care - PPO | Admitting: Physical Therapy

## 2020-04-13 ENCOUNTER — Ambulatory Visit (HOSPITAL_BASED_OUTPATIENT_CLINIC_OR_DEPARTMENT_OTHER): Payer: Commercial Managed Care - PPO | Admitting: Physical Therapy

## 2020-04-17 ENCOUNTER — Other Ambulatory Visit: Payer: Self-pay | Admitting: Internal Medicine

## 2020-04-17 DIAGNOSIS — E041 Nontoxic single thyroid nodule: Secondary | ICD-10-CM

## 2020-04-20 ENCOUNTER — Other Ambulatory Visit: Payer: Self-pay

## 2020-04-20 ENCOUNTER — Ambulatory Visit (HOSPITAL_BASED_OUTPATIENT_CLINIC_OR_DEPARTMENT_OTHER): Payer: Commercial Managed Care - PPO | Attending: Orthopedic Surgery | Admitting: Physical Therapy

## 2020-04-20 DIAGNOSIS — R600 Localized edema: Secondary | ICD-10-CM

## 2020-04-20 DIAGNOSIS — M25671 Stiffness of right ankle, not elsewhere classified: Secondary | ICD-10-CM | POA: Diagnosis present

## 2020-04-20 DIAGNOSIS — M6281 Muscle weakness (generalized): Secondary | ICD-10-CM

## 2020-04-20 DIAGNOSIS — M25571 Pain in right ankle and joints of right foot: Secondary | ICD-10-CM | POA: Diagnosis not present

## 2020-04-20 NOTE — Patient Instructions (Signed)
Access Code: [email or web address] URL: [email or web address] Date: 04/20/2020 Prepared by: Estill Bamberg April [facility]  Exercises Gastroc Stretch on Wall - 1 x daily - 7 x weekly - 2 sets - 20-30 sec hold Soleus Stretch on Wall - 1 x daily - 7 x weekly - 3 sets - 20-30 sec hold Heel rises with counter support - 1 x daily - 7 x weekly - 2 sets - 10 reps Seated Ankle Inversion with Resistance and Legs Crossed - 1 x daily - 7 x weekly - 2 sets - 10 reps Seated Ankle Eversion with Resistance - 1 x daily - 7 x weekly - 2 sets - 10 reps

## 2020-04-20 NOTE — Therapy (Signed)
[facility] [address] [place], [place], [address] Phone: [phone]   Fax:  [phone]  Physical Therapy Evaluation  Patient Details  Name: Brittany Buckley MRN: [id number] Date of Birth: 03/07/1955 Referring Provider (PT): Wylene Simmer, MD   Encounter Date: 04/20/2020   PT End of Session - 04/20/20 1709    Visit Number 1    Number of Visits 7    Date for PT Re-Evaluation 06/01/20    Authorization Type UHC/UMR    PT Start Time 1600    PT Stop Time 1645    PT Time Calculation (min) 45 min    Activity Tolerance Patient tolerated treatment well    Behavior During Therapy [name] for tasks assessed/performed           Past Medical History:  Diagnosis Date  . Adult ADHD   . Anxiety   . Arthritis    "hands, feet" (06/14/2016)  . Asthma   . Atrial fibrillation ([place])   . CAD (coronary artery disease)   . Chronic lower back pain   . Chronic neck pain   . Coronary artery disease    a. history of MI and prior stenting in ~2014/2015 in [place] (stents in LCx and RCA based on stable cath in 06/2018).  . Diabetes mellitus without complication ([place])   . Gastric ulcer   . GERD (gastroesophageal reflux disease)   . Headache    "monthly" (06/14/2016)  . Heart murmur   . High cholesterol   . History of blood transfusion 1983   Bleed after C-Section  . Hyperlipidemia   . Hypertension   . MI (myocardial infarction) ([place]) 2014; 2015  . Migraine    "probably twice/year" (06/14/2016)  . Myocardial infarction ([place])   . Numbness of toes   . Obesity   . On anticoagulant therapy   . Peripheral neuropathy   . Persistent atrial fibrillation ([place])   . Pneumonia 1990s  . Thyroid nodule   . Type II diabetes mellitus ([place])   . Urticaria     Past Surgical History:  Procedure Laterality Date  . ABDOMINAL HYSTERECTOMY    . ADENOIDECTOMY    . BIOPSY THYROID    . BREAST CYST EXCISION Left 01/21/2019   Procedure: EXCISION OF LEFT  BREAST MASS;  Surgeon: Jovita Kussmaul, MD;  Location: [facility];  Service: General;  Laterality: Left;  . BREAST EXCISIONAL BIOPSY Right   . CESAREAN SECTION    . CORONARY ANGIOPLASTY WITH STENT PLACEMENT  2014 & 2015  . CORONARY ANGIOPLASTY WITH STENT PLACEMENT    . LEFT HEART CATH AND CORONARY ANGIOGRAPHY N/A 07/09/2018   Procedure: LEFT HEART CATH AND CORONARY ANGIOGRAPHY;  Surgeon: Belva Crome, MD;  Location: [facility];  Service: Cardiovascular;  Laterality: N/A;  . LEFT HEART CATH AND CORONARY ANGIOGRAPHY N/A 04/09/2020   Procedure: LEFT HEART CATH AND CORONARY ANGIOGRAPHY;  Surgeon: Belva Crome, MD;  Location: [facility];  Service: Cardiovascular;  Laterality: N/A;  . TONSILLECTOMY    . TONSILLECTOMY AND ADENOIDECTOMY    . TRIGGER FINGER RELEASE Right    "pointer"    There were no vitals filed for this visit.    Subjective Assessment - 04/20/20 1603    Subjective Pt reports that on 01/17/20 she fell down a hill and broke her R ankle; surgery performed 01/19/20. Pt reports she has had increased medial ankle pain. Pt states she's been doing exercises for flexibility at home per her  ortho's recommendation. 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 Pt states she has increased R hip and back pain compensating for wearing her boot for such a long time. She has weaned herself off the boot.    Pertinent History CAD, DM, hyperlipidemia, a-fib, anxiety, arthritis, asthma, GERD, HTN, hyperlipidemia, MI    Limitations Standing;House hold activities    How long can you sit comfortably? n/a    How long can you stand comfortably? ~30 minutes    How long can you walk comfortably? No issues    Patient Stated Goals Relieve low back/hip pain and improve ankle motion    Currently in Pain? No/denies              [facility] PT Assessment - 04/20/20 0001      Assessment   Medical Diagnosis S82.841A (ICD-10-CM) - Displaced bimalleolar fracture of  right lower leg, initial encounter for closed fracture    Referring Provider (PT) Wylene Simmer, MD      Precautions   Precautions None      Restrictions   Weight Bearing Restrictions No      Balance Screen   Has the patient fallen in the past 6 months No      [place] residence    Living Arrangements Spouse/significant other    Type of [place] to enter    Entrance Stairs-Number of Steps 1   Sometimes has to use cane or walking stick if rainy or icy; pt tries to avoid steps as able     Prior Function   Level of Independence Independent    Vocation Full time employment    Conservation officer, nature      Observation/Other Assessments-Edema    Edema Figure 8      Figure 8 Edema   Figure 8 - Right  49.5 cm    Figure 8 - Left  47.5 cm      Sensation   Light Touch Impaired Detail    Light Touch Impaired Details Impaired RLE      Functional Tests   Functional tests Single leg stance;Squat      Single Leg Stance   Comments L LE: 9 sec; R LE: 3 sec (pain bilateral ankle)      Posture/Postural Control   Posture/Postural Control Postural limitations    Postural Limitations Right pelvic obliquity;Flexed trunk;Weight shift left      AROM   Right Ankle Dorsiflexion -12    Right Ankle Plantar Flexion 45    Right Ankle Inversion 20    Right Ankle Eversion 5      Strength   Right/Left Ankle Right;Left    Right Ankle Dorsiflexion 5/5    Right Ankle Plantar Flexion 5/5    Right Ankle Inversion 4/5   pain in medial ankle   Right Ankle Eversion 5/5    Left Ankle Dorsiflexion 5/5    Left Ankle Plantar Flexion 5/5    Left Ankle Inversion 5/5    Left Ankle Eversion 5/5      Ambulation/Gait   Ambulation Distance (Feet) 50 Feet    Assistive device None    Gait Pattern Step-through pattern;Decreased dorsiflexion - right;Decreased weight shift to right;Right hip hike;Right foot flat;Antalgic;Abducted-  right    Ambulation Surface Level;Indoor                      Objective measurements  completed on examination: See above findings.               PT Education - 04/20/20 1709    Education Details Discussed exam findings and POC. Discussed HEP.    Person(s) Educated Patient    Methods Explanation;Verbal cues;Handout    Comprehension Verbalized understanding;Returned demonstration;Tactile cues required            PT Short Term Goals - 04/20/20 1656      PT SHORT TERM GOAL #1   Title Pt will be independent with initial HEP.    Time 3    Period Weeks    Status New    Target Date 05/11/20      PT SHORT TERM GOAL #2   Title Pt will have improved ankle DF to at least -8 deg    Baseline -15 deg    Time 3    Period Weeks    Status New    Target Date 05/11/20      PT SHORT TERM GOAL #3   Title Pt will be able to maintain R LE SLS at least 5 sec to demo improved ankle stability    Baseline Currently unable to hold past 2 sec    Time 3    Period Weeks    Status New    Target Date 05/11/20             PT Long Term Goals - 04/20/20 1702      PT LONG TERM GOAL #1   Title Pt will be independent with final HEP    Time 6    Period Weeks    Status New    Target Date 06/01/20      PT LONG TERM GOAL #2   Title Pt will be able to maintain SLS at least 10 sec on R LE to demo improved ankle stability    Time 6    Period Weeks    Status New    Target Date 06/01/20      PT LONG TERM GOAL #3   Title Pt will be able to stand as long as needed for work without pain    Baseline Pt with R hip/back and ankle pain.    Time 6    Period Weeks    Status New    Target Date 06/01/20      PT LONG TERM GOAL #4   Title Pt will demo normal reciprocal gait pattern    Time 6    Period Weeks    Status New    Target Date 06/01/20                  Plan - 04/20/20 1647    Clinical Impression Statement Mrs. Brittany Nations" Buckley is a 65 y/o F presenting to OPPT  due to history of displaced R bimalleolar fracture. Pt's assessment significant for abnormal posture from prolonged time compensating R LE NWB, decreased R ankle strength, ROM, and balance affecting her functional mobility for transfers, amb, and standing endurance. Mrs. Flener would benefit from therapy to optimize her mobility for return to prior level function at home and at work.    Personal Factors and Comorbidities Age;Fitness;Time since onset of injury/illness/exacerbation    Examination-Activity Limitations Transfers;Stand;Stairs;Locomotion Level    Examination-Participation Restrictions Community Activity    Stability/Clinical Decision Making Stable/Uncomplicated    Clinical Decision Making Low    Rehab Potential Good    PT Frequency 1x / week    PT  Duration 6 weeks    PT Treatment/Interventions Cryotherapy;Electrical Stimulation;ADLs/Self Care Home Management;Ultrasound;Gait training;Stair training;Functional mobility training;Therapeutic activities;Therapeutic exercise;Balance training;Neuromuscular re-education;Manual techniques;Patient/family education;Compression bandaging;Scar mobilization;Passive range of motion;Dry needling;Taping;Vasopneumatic Device    PT Next Visit Plan Assess response to HEP. Strengthen fibular and posterior tibialis muscles. Manual therapy to improve joint mobility. Work on hip strengthening. Gait training to decrease compensatory patterns    PT Home Exercise Plan Access Code: [email or web address]    Consulted and Agree with Plan of Care Patient           Patient will benefit from skilled therapeutic intervention in order to improve the following deficits and impairments:  Abnormal gait,Decreased range of motion,Pain,Increased fascial restricitons,Decreased activity tolerance,Decreased balance,Hypomobility,Impaired flexibility,Decreased mobility,Decreased strength,Increased edema,Impaired sensation  Visit Diagnosis: Pain in right ankle and joints of right  foot  Stiffness of right ankle, not elsewhere classified  Localized edema  Muscle weakness (generalized)     Problem List Patient Active Problem List   Diagnosis Date Noted  . Angina pectoris ([place]) 07/09/2018  . Colitis presumed to be due to infection 12/22/2016  . CAD in native artery 12/22/2016  . DM2 (diabetes mellitus, type 2) ([place]) 12/22/2016  . Acute hypotension 12/22/2016  . Atrial fibrillation with rapid ventricular response ([place]) 06/14/2016  . Murmur, cardiac 03/21/2016  . Hypercholesteremia 03/21/2016    Aaren Atallah April Ma L Alveda Vanhorne PT, DPT 04/20/2020, 5:10 PM  [facility] [address] [place], [place], [address] Phone: [phone]   Fax:  [phone]  Name: Brittany Buckley MRN: [id number] Date of Birth: 1955/06/06

## 2020-04-28 ENCOUNTER — Ambulatory Visit (HOSPITAL_BASED_OUTPATIENT_CLINIC_OR_DEPARTMENT_OTHER): Payer: Commercial Managed Care - PPO | Admitting: Physical Therapy

## 2020-04-30 NOTE — Progress Notes (Deleted)
Cardiology Office Note:    Date:  04/30/2020   ID:  Brittany Buckley, DOB [birth year], MRN [id number]  PCP:  Brittany Buckley, [place]  Cardiologist:  Brittany Moores, MD *** Advanced Practice Provider:  No care team member to display Electrophysiologist:  Brittany Meredith Leeds, MD  {Press F2 to show EP APP, CHF, sleep or structural heart MD               :[id number]}  { Click here to update then REFRESH NOTE - MD (PCP) or APP (Team Member)  Change PCP Type for MD, Specialty for APP is either Cardiology or Clinical Cardiac Electrophysiology  :[id number]}   Referring MD: Brittany Redwood, MD   Chief Complaint:  No chief complaint on file.    Patient Profile:    Brittany Buckley is a 65 y.o. female with:   Coronary artery disease   S/p MI ~2014/2015 in [place] tx w stenting  Cath in 06/2018: patent stents  Cath in 03/2020: patent stents in RCA, LCx   Persistent atrial fibrillation   Dronedarone   Dabigatran   Hypertension   Hyperlipidemia   Gastric ulcer   Asthma   ADHD  Diabetes mellitus   Migraine HAs   Obesity   Bradycardia, hypotension >> beta-blocker dose reduced  R ankle fx in 12/2019  Prior CV studies: Cardiac catheterization 19-Apr-2020 LAD proximal 30 LCx mid 50, mid stent patent with 20 ISR; OM1 55 RCA mid 40, stent patent with 35 ISR, distal 45; RPDA 50 EF 55-65  Event monitor 07/2019  Sinus rhythm including sinus brady cardia and sinus tachycardia  Occasional episodes of Paroxysmal atrial fib  Rare episodes of nonsustained VT ( max 6 beats  Echocardiogram 07/16/2019 EF 60-65, no RWMA, mild LVH, GR 1 DD, normal RVSF, mild LAE, trivial MR, mild AI  History of Present Illness:    [name] was last seen by Brittany Buckley in 03/2020.  She was having symptoms of chest pain concerning for unstable angina.  Cardiac catheterization was arranged.        Cardiac catheterization on 19-Apr-2020 demonstrated patent stent in the LCx  and RCA.  There was mild to moderate nonobstructive disease elsewhere.  She returns for follow-up.***  Past Medical History:  Diagnosis Date  . Adult ADHD   . Anxiety   . Arthritis    "hands, feet" (06/14/2016)  . Asthma   . Atrial fibrillation ([place])   . CAD (coronary artery disease)   . Chronic lower back pain   . Chronic neck pain   . Coronary artery disease    a. history of MI and prior stenting in ~2014/2015 in [place] (stents in LCx and RCA based on stable cath in 06/2018).  . Diabetes mellitus without complication ([place])   . Gastric ulcer   . GERD (gastroesophageal reflux disease)   . Headache    "monthly" (06/14/2016)  . Heart murmur   . High cholesterol   . History of blood transfusion 1983   Bleed after C-Section  . Hyperlipidemia   . Hypertension   . MI (myocardial infarction) ([place]) 2014; 2015  . Migraine    "probably twice/year" (06/14/2016)  . Myocardial infarction ([place])   . Numbness of toes   . Obesity   . On anticoagulant therapy   . Peripheral neuropathy   . Persistent atrial fibrillation ([place])   . Pneumonia 1990s  . Thyroid nodule   . Type II diabetes mellitus ([place])   . Urticaria  Current Medications: No outpatient medications have been marked as taking for the 05/01/20 encounter (Appointment) with Brittany Buckley T, PA-C.     Allergies:   Codeine, Onion, Tomato, Chocolate, and Demerol [meperidine]   Social History   Tobacco Use  . Smoking status: Never Smoker  . Smokeless tobacco: Never Used  Substance Use Topics  . Alcohol use: Not Currently  . Drug use: Not Currently     Family Hx: The patient's family history includes CAD in her father; Cancer - Ovarian in her sister; Colon polyps in her mother; Diabetes in her maternal grandmother and paternal grandfather; Healthy in her son; Heart attack in her father; Heart disease in her paternal grandmother; Hyperlipidemia in her mother; Obesity in her daughter.  ROS   EKGs/Labs/Other Test  Reviewed:    EKG:  EKG is *** ordered today.  The ekg ordered today demonstrates ***  Recent Labs: 06/04/2019: Magnesium 2.1; TSH 0.797 04/07/2020: BUN 15; Creatinine, Ser 0.78; Hemoglobin 14.0; Platelets 211; Potassium 4.4; Sodium 143   Recent Lipid Panel Lab Results  Component Value Date/Time   CHOL 179 04/04/2016 03:40 PM   TRIG 180 (H) 04/04/2016 03:40 PM   HDL 47 04/04/2016 03:40 PM   CHOLHDL 3.8 04/04/2016 03:40 PM   LDLCALC 96 04/04/2016 03:40 PM      Risk Assessment/Calculations:   {Does this patient have ATRIAL FIBRILLATION?[phone]}  Physical Exam:    VS:  There were no vitals taken for this visit.    Wt Readings from Last 3 Encounters:  04/09/20 197 lb (89.4 kg)  04/03/20 201 lb (91.2 kg)  01/19/20 205 lb (93 kg)     Physical Exam ***  ASSESSMENT & PLAN:    ***  {Are you ordering a CV Procedure (e.g. stress test, cath, DCCV, TEE, etc)?   Press F2        :[id number]}    Dispo:  No follow-ups on file.   Medication Adjustments/Labs and Tests Ordered: Current medicines are reviewed at length with the patient today.  Concerns regarding medicines are outlined above.  Tests Ordered: No orders of the defined types were placed in this encounter.  Medication Changes: No orders of the defined types were placed in this encounter.   Signed, Brittany Dopp, PA-C  04/30/2020 4:47 PM    [facility] [place], [place], [place]  [address] Phone: [phone]; Fax: [phone]

## 2020-05-01 ENCOUNTER — Ambulatory Visit (HOSPITAL_BASED_OUTPATIENT_CLINIC_OR_DEPARTMENT_OTHER): Payer: Commercial Managed Care - PPO | Attending: Orthopedic Surgery | Admitting: Physical Therapy

## 2020-05-01 ENCOUNTER — Other Ambulatory Visit: Payer: Self-pay

## 2020-05-01 ENCOUNTER — Ambulatory Visit: Payer: Commercial Managed Care - PPO | Admitting: Physician Assistant

## 2020-05-01 DIAGNOSIS — R600 Localized edema: Secondary | ICD-10-CM | POA: Insufficient documentation

## 2020-05-01 DIAGNOSIS — I10 Essential (primary) hypertension: Secondary | ICD-10-CM

## 2020-05-01 DIAGNOSIS — M25571 Pain in right ankle and joints of right foot: Secondary | ICD-10-CM | POA: Diagnosis not present

## 2020-05-01 DIAGNOSIS — I25119 Atherosclerotic heart disease of native coronary artery with unspecified angina pectoris: Secondary | ICD-10-CM

## 2020-05-01 DIAGNOSIS — I4819 Other persistent atrial fibrillation: Secondary | ICD-10-CM

## 2020-05-01 DIAGNOSIS — E785 Hyperlipidemia, unspecified: Secondary | ICD-10-CM

## 2020-05-01 DIAGNOSIS — M6281 Muscle weakness (generalized): Secondary | ICD-10-CM | POA: Insufficient documentation

## 2020-05-01 DIAGNOSIS — M25671 Stiffness of right ankle, not elsewhere classified: Secondary | ICD-10-CM | POA: Diagnosis present

## 2020-05-01 DIAGNOSIS — E1159 Type 2 diabetes mellitus with other circulatory complications: Secondary | ICD-10-CM

## 2020-05-01 NOTE — Therapy (Signed)
[facility] [address] [place], [place], [address] Phone: [phone]   Fax:  [phone]  Physical Therapy Treatment  Patient Details  Name: Brittany Buckley MRN: [id number] Date of Birth: 1956/02/04 Referring Provider (PT): Wylene Simmer, MD   Encounter Date: 05/01/2020   PT End of Session - 05/01/20 1659    Visit Number 2    Number of Visits 7    Date for PT Re-Evaluation 06/01/20    Authorization Type UHC/UMR    PT Start Time 1600    PT Stop Time 1645    PT Time Calculation (min) 45 min    Activity Tolerance Patient tolerated treatment well    Behavior During Therapy [facility] for tasks assessed/performed           Past Medical History:  Diagnosis Date  . Adult ADHD   . Anxiety   . Arthritis    "hands, feet" (06/14/2016)  . Asthma   . Atrial fibrillation ([place])   . CAD (coronary artery disease)   . Chronic lower back pain   . Chronic neck pain   . Coronary artery disease    a. history of MI and prior stenting in ~2014/2015 in [place] (stents in LCx and RCA based on stable cath in 06/2018).  . Diabetes mellitus without complication ([place])   . Gastric ulcer   . GERD (gastroesophageal reflux disease)   . Headache    "monthly" (06/14/2016)  . Heart murmur   . High cholesterol   . History of blood transfusion 1983   Bleed after C-Section  . Hyperlipidemia   . Hypertension   . MI (myocardial infarction) ([place]) 2014; 2015  . Migraine    "probably twice/year" (06/14/2016)  . Myocardial infarction ([place])   . Numbness of toes   . Obesity   . On anticoagulant therapy   . Peripheral neuropathy   . Persistent atrial fibrillation ([place])   . Pneumonia 1990s  . Thyroid nodule   . Type II diabetes mellitus ([place])   . Urticaria     Past Surgical History:  Procedure Laterality Date  . ABDOMINAL HYSTERECTOMY    . ADENOIDECTOMY    . BIOPSY THYROID    . BREAST CYST EXCISION Left 01/21/2019   Procedure: EXCISION OF LEFT  BREAST MASS;  Surgeon: Jovita Kussmaul, MD;  Location: [facility];  Service: General;  Laterality: Left;  . BREAST EXCISIONAL BIOPSY Right   . CESAREAN SECTION    . CORONARY ANGIOPLASTY WITH STENT PLACEMENT  2014 & 2015  . CORONARY ANGIOPLASTY WITH STENT PLACEMENT    . LEFT HEART CATH AND CORONARY ANGIOGRAPHY N/A 07/09/2018   Procedure: LEFT HEART CATH AND CORONARY ANGIOGRAPHY;  Surgeon: Belva Crome, MD;  Location: [facility];  Service: Cardiovascular;  Laterality: N/A;  . LEFT HEART CATH AND CORONARY ANGIOGRAPHY N/A 04/09/2020   Procedure: LEFT HEART CATH AND CORONARY ANGIOGRAPHY;  Surgeon: Belva Crome, MD;  Location: [facility];  Service: Cardiovascular;  Laterality: N/A;  . TONSILLECTOMY    . TONSILLECTOMY AND ADENOIDECTOMY    . TRIGGER FINGER RELEASE Right    "pointer"    There were no vitals filed for this visit.   Subjective Assessment - 05/01/20 1608    Subjective Pt reports that swelling has decreased with some massage.    Pertinent History CAD, DM, hyperlipidemia, a-fib, anxiety, arthritis, asthma, GERD, HTN, hyperlipidemia, MI    Limitations Standing;House hold activities    How long can you sit  comfortably? n/a    How long can you stand comfortably? ~30 minutes    How long can you walk comfortably? No issues    Patient Stated Goals Relieve low back/hip pain and improve ankle motion             Sci-fit warm up: x2 min forward, 2 min backward L1   Manual therapy:  Talocrural distraction 3x10 sec  Talocrural PA mobs for dorsiflexion 3x10 with PNF  Lateral calcaneus mobilization for improved inversion & eversion 3x10 sec each  STW fibularis longus and posterior tibialis  KT taping along fibularis and posterior tibialis to facilitate muscle action   Therex in supine:  R ankle inversion x10  R ankle eversion x10   Standing:  Gastroc stretch 2x30 sec  Soleus stretch 2x30 sec  Heel raise x10    Gait training:  x25' no a/d; cueing for heel strike  and toe off. Less foot abduction noted                 PT Short Term Goals - 04/20/20 1656      PT SHORT TERM GOAL #1   Title Pt will be independent with initial HEP.    Time 3    Period Weeks    Status New    Target Date 05/11/20      PT SHORT TERM GOAL #2   Title Pt will have improved ankle DF to at least -8 deg    Baseline -15 deg    Time 3    Period Weeks    Status New    Target Date 05/11/20      PT SHORT TERM GOAL #3   Title Pt will be able to maintain R LE SLS at least 5 sec to demo improved ankle stability    Baseline Currently unable to hold past 2 sec    Time 3    Period Weeks    Status New    Target Date 05/11/20             PT Long Term Goals - 04/20/20 1702      PT LONG TERM GOAL #1   Title Pt will be independent with final HEP    Time 6    Period Weeks    Status New    Target Date 06/01/20      PT LONG TERM GOAL #2   Title Pt will be able to maintain SLS at least 10 sec on R LE to demo improved ankle stability    Time 6    Period Weeks    Status New    Target Date 06/01/20      PT LONG TERM GOAL #3   Title Pt will be able to stand as long as needed for work without pain    Baseline Pt with R hip/back and ankle pain.    Time 6    Period Weeks    Status New    Target Date 06/01/20      PT LONG TERM GOAL #4   Title Pt will demo normal reciprocal gait pattern    Time 6    Period Weeks    Status New    Target Date 06/01/20                 Plan - 05/01/20 1656    Clinical Impression Statement Treatment focused on reviewing HEP and modifying accordingly. Pt with increased ankle pain performing resistive exercises; decreased resistance and provided taping  for facilitation. Pt reports feeling the muscle but no pain with use of tape. Manual therapy provided to stretch and mobilize pt's ankle joint.    Personal Factors and Comorbidities Age;Fitness;Time since onset of injury/illness/exacerbation    Examination-Activity  Limitations Transfers;Stand;Stairs;Locomotion Level    Examination-Participation Restrictions Community Activity    Stability/Clinical Decision Making Stable/Uncomplicated    Rehab Potential Good    PT Frequency 1x / week    PT Duration 6 weeks    PT Treatment/Interventions Cryotherapy;Electrical Stimulation;ADLs/Self Care Home Management;Ultrasound;Gait training;Stair training;Functional mobility training;Therapeutic activities;Therapeutic exercise;Balance training;Neuromuscular re-education;Manual techniques;Patient/family education;Compression bandaging;Scar mobilization;Passive range of motion;Dry needling;Taping;Vasopneumatic Device    PT Next Visit Plan Assess response to HEP. Strengthen fibular and posterior tibialis muscles. Manual therapy to improve joint mobility. Work on hip strengthening. Gait training to decrease compensatory patterns    PT Home Exercise Plan Access Code: [email or web address]    Consulted and Agree with Plan of Care Patient           Patient will benefit from skilled therapeutic intervention in order to improve the following deficits and impairments:  Abnormal gait,Decreased range of motion,Pain,Increased fascial restricitons,Decreased activity tolerance,Decreased balance,Hypomobility,Impaired flexibility,Decreased mobility,Decreased strength,Increased edema,Impaired sensation  Visit Diagnosis: Pain in right ankle and joints of right foot  Stiffness of right ankle, not elsewhere classified  Localized edema  Muscle weakness (generalized)     Problem List Patient Active Problem List   Diagnosis Date Noted  . Angina pectoris ([place]) 07/09/2018  . Colitis presumed to be due to infection 12/22/2016  . CAD in native artery 12/22/2016  . DM2 (diabetes mellitus, type 2) ([place]) 12/22/2016  . Acute hypotension 12/22/2016  . Atrial fibrillation with rapid ventricular response ([place]) 06/14/2016  . Murmur, cardiac 03/21/2016  . Hypercholesteremia 03/21/2016    Brittany Buckley April  Ma L Jeremyah Jelley PT, DPT 05/01/2020, 5:05 PM  [facility] [place], [place], [address] Phone: [phone]   Fax:  [phone]  Name: Brittany Buckley MRN: [id number] Date of Birth: 07-Feb-1956

## 2020-05-05 ENCOUNTER — Encounter (HOSPITAL_BASED_OUTPATIENT_CLINIC_OR_DEPARTMENT_OTHER): Payer: Commercial Managed Care - PPO | Admitting: Physical Therapy

## 2020-05-08 ENCOUNTER — Ambulatory Visit (HOSPITAL_BASED_OUTPATIENT_CLINIC_OR_DEPARTMENT_OTHER): Payer: Commercial Managed Care - PPO | Admitting: Physical Therapy

## 2020-05-08 ENCOUNTER — Other Ambulatory Visit: Payer: Self-pay

## 2020-05-08 VITALS — BP 207/105

## 2020-05-08 DIAGNOSIS — M6281 Muscle weakness (generalized): Secondary | ICD-10-CM

## 2020-05-08 DIAGNOSIS — M25571 Pain in right ankle and joints of right foot: Secondary | ICD-10-CM

## 2020-05-08 DIAGNOSIS — M25671 Stiffness of right ankle, not elsewhere classified: Secondary | ICD-10-CM

## 2020-05-08 DIAGNOSIS — R600 Localized edema: Secondary | ICD-10-CM

## 2020-05-08 NOTE — Therapy (Signed)
[facility] [place], [place], [address] Phone: [phone]   Fax:  [phone]  Physical Therapy Treatment  Patient Details  Name: Brittany Buckley MRN: [id number] Date of Birth: 1955-12-09 Referring Provider (PT): Wylene Simmer, MD   Encounter Date: 05/08/2020   PT End of Session - 05/08/20 1654    Visit Number 3    Number of Visits 7    Date for PT Re-Evaluation 06/01/20    Authorization Type UHC/UMR    PT Start Time 1608    PT Stop Time 1650    PT Time Calculation (min) 42 min    Activity Tolerance Patient tolerated treatment well    Behavior During Therapy [facility] for tasks assessed/performed           Past Medical History:  Diagnosis Date  . Adult ADHD   . Anxiety   . Arthritis    "hands, feet" (06/14/2016)  . Asthma   . Atrial fibrillation ([place])   . CAD (coronary artery disease)   . Chronic lower back pain   . Chronic neck pain   . Coronary artery disease    a. history of MI and prior stenting in ~2014/2015 in [place] (stents in LCx and RCA based on stable cath in 06/2018).  . Diabetes mellitus without complication ([place])   . Gastric ulcer   . GERD (gastroesophageal reflux disease)   . Headache    "monthly" (06/14/2016)  . Heart murmur   . High cholesterol   . History of blood transfusion 1983   Bleed after C-Section  . Hyperlipidemia   . Hypertension   . MI (myocardial infarction) ([place]) 2014; 2015  . Migraine    "probably twice/year" (06/14/2016)  . Myocardial infarction ([place])   . Numbness of toes   . Obesity   . On anticoagulant therapy   . Peripheral neuropathy   . Persistent atrial fibrillation ([place])   . Pneumonia 1990s  . Thyroid nodule   . Type II diabetes mellitus ([place])   . Urticaria     Past Surgical History:  Procedure Laterality Date  . ABDOMINAL HYSTERECTOMY    . ADENOIDECTOMY    . BIOPSY THYROID    . BREAST CYST EXCISION Left 01/21/2019   Procedure: EXCISION OF LEFT  BREAST MASS;  Surgeon: Jovita Kussmaul, MD;  Location: [facility];  Service: General;  Laterality: Left;  . BREAST EXCISIONAL BIOPSY Right   . CESAREAN SECTION    . CORONARY ANGIOPLASTY WITH STENT PLACEMENT  2014 & 2015  . CORONARY ANGIOPLASTY WITH STENT PLACEMENT    . LEFT HEART CATH AND CORONARY ANGIOGRAPHY N/A 07/09/2018   Procedure: LEFT HEART CATH AND CORONARY ANGIOGRAPHY;  Surgeon: Belva Crome, MD;  Location: [facility];  Service: Cardiovascular;  Laterality: N/A;  . LEFT HEART CATH AND CORONARY ANGIOGRAPHY N/A 04/09/2020   Procedure: LEFT HEART CATH AND CORONARY ANGIOGRAPHY;  Surgeon: Belva Crome, MD;  Location: [facility];  Service: Cardiovascular;  Laterality: N/A;  . TONSILLECTOMY    . TONSILLECTOMY AND ADENOIDECTOMY    . TRIGGER FINGER RELEASE Right    "pointer"    Vitals:   05/08/20 1744  BP: (!) 207/105     Subjective Assessment - 05/08/20 1608    Subjective Pt has been exercising her ankle more and feels more swelling. Pt states that the tape fell off after she went to the pool.    Pertinent History CAD, DM, hyperlipidemia, a-fib, anxiety, arthritis, asthma, GERD,  HTN, hyperlipidemia, MI    Limitations Standing;House hold activities    How long can you sit comfortably? n/a    How long can you stand comfortably? ~30 minutes    How long can you walk comfortably? No issues    Patient Stated Goals Relieve low back/hip pain and improve ankle motion    Currently in Pain? No/denies                             [facility] Adult PT Treatment/Exercise - 05/08/20 0001      Exercises   Exercises Knee/Hip;Ankle      Knee/Hip Exercises: Stretches   Active Hamstring Stretch Right;Left;20 seconds    Hip Flexor Stretch Both;30 seconds    Gastroc Stretch Right;Left;20 seconds    Soleus Stretch Right;Left;20 seconds      Knee/Hip Exercises: Standing   Hip Extension Stengthening;Left;Right;2 sets;10 reps;Knee straight    Other Standing Knee Exercises  On foam tandem stance 2x30 sec bilat      Knee/Hip Exercises: Supine   Bridges Limitations --   Attempted x5 but pt felt too much in quads rather than glutes   Straight Leg Raises Right;Left;2 sets;10 reps      Knee/Hip Exercises: Sidelying   Clams 2x10 green tband      Modalities   Modalities Vasopneumatic      Vasopneumatic   Number Minutes Vasopneumatic  10 minutes    Vasopnuematic Location  Ankle    Vasopneumatic Pressure Medium    Vasopneumatic Temperature  36                    PT Short Term Goals - 04/20/20 1656      PT SHORT TERM GOAL #1   Title Pt will be independent with initial HEP.    Time 3    Period Weeks    Status New    Target Date 05/11/20      PT SHORT TERM GOAL #2   Title Pt will have improved ankle DF to at least -8 deg    Baseline -15 deg    Time 3    Period Weeks    Status New    Target Date 05/11/20      PT SHORT TERM GOAL #3   Title Pt will be able to maintain R LE SLS at least 5 sec to demo improved ankle stability    Baseline Currently unable to hold past 2 sec    Time 3    Period Weeks    Status New    Target Date 05/11/20             PT Long Term Goals - 04/20/20 1702      PT LONG TERM GOAL #1   Title Pt will be independent with final HEP    Time 6    Period Weeks    Status New    Target Date 06/01/20      PT LONG TERM GOAL #2   Title Pt will be able to maintain SLS at least 10 sec on R LE to demo improved ankle stability    Time 6    Period Weeks    Status New    Target Date 06/01/20      PT LONG TERM GOAL #3   Title Pt will be able to stand as long as needed for work without pain    Baseline Pt with R hip/back and ankle  pain.    Time 6    Period Weeks    Status New    Target Date 06/01/20      PT LONG TERM GOAL #4   Title Pt will demo normal reciprocal gait pattern    Time 6    Period Weeks    Status New    Target Date 06/01/20                 Plan - 05/08/20 1650    Clinical Impression  Statement Treatment focused on gross LE strengthening. Pt with increased ankle edema -- provided vaso and ankle stretching. Initiated ankle stabilization/balance exercises. Pt's BP found to be elevated by end of session. Discussed going to the ED.    Personal Factors and Comorbidities Age;Fitness;Time since onset of injury/illness/exacerbation    Examination-Activity Limitations Transfers;Stand;Stairs;Locomotion Level    Examination-Participation Restrictions Community Activity    Stability/Clinical Decision Making Stable/Uncomplicated    Rehab Potential Good    PT Frequency 1x / week    PT Duration 6 weeks    PT Treatment/Interventions Cryotherapy;Electrical Stimulation;ADLs/Self Care Home Management;Ultrasound;Gait training;Stair training;Functional mobility training;Therapeutic activities;Therapeutic exercise;Balance training;Neuromuscular re-education;Manual techniques;Patient/family education;Compression bandaging;Scar mobilization;Passive range of motion;Dry needling;Taping;Vasopneumatic Device    PT Next Visit Plan Check STGs! Assess response to HEP. Strengthen fibular and posterior tibialis muscles. Manual therapy to improve joint mobility. Work on hip strengthening. Gait training as indicated    PT Home Exercise Plan Access Code: [email or web address]    Consulted and Agree with Plan of Care Patient           Patient will benefit from skilled therapeutic intervention in order to improve the following deficits and impairments:  Abnormal gait,Decreased range of motion,Pain,Increased fascial restricitons,Decreased activity tolerance,Decreased balance,Hypomobility,Impaired flexibility,Decreased mobility,Decreased strength,Increased edema,Impaired sensation  Visit Diagnosis: Pain in right ankle and joints of right foot  Stiffness of right ankle, not elsewhere classified  Localized edema  Muscle weakness (generalized)     Problem List Patient Active Problem List   Diagnosis Date Noted  .  Angina pectoris ([place]) 07/09/2018  . Colitis presumed to be due to infection 12/22/2016  . CAD in native artery 12/22/2016  . DM2 (diabetes mellitus, type 2) ([place]) 12/22/2016  . Acute hypotension 12/22/2016  . Atrial fibrillation with rapid ventricular response ([place]) 06/14/2016  . Murmur, cardiac 03/21/2016  . Hypercholesteremia 03/21/2016    Jayleon Mcfarlane April Ma L Sherrey [place] PT, DPT 05/08/2020, 5:45 PM  [facility] [address] [place], [place], [address] Phone: [phone]   Fax:  [phone]  Name: Brittany Buckley MRN: [id number] Date of Birth: [birth year]

## 2020-05-12 ENCOUNTER — Encounter (HOSPITAL_BASED_OUTPATIENT_CLINIC_OR_DEPARTMENT_OTHER): Payer: Commercial Managed Care - PPO | Admitting: Physical Therapy

## 2020-05-15 ENCOUNTER — Encounter (HOSPITAL_BASED_OUTPATIENT_CLINIC_OR_DEPARTMENT_OTHER): Payer: Commercial Managed Care - PPO | Admitting: Physical Therapy

## 2020-05-19 ENCOUNTER — Encounter (HOSPITAL_BASED_OUTPATIENT_CLINIC_OR_DEPARTMENT_OTHER): Payer: Commercial Managed Care - PPO | Admitting: Physical Therapy

## 2020-05-22 ENCOUNTER — Ambulatory Visit (HOSPITAL_BASED_OUTPATIENT_CLINIC_OR_DEPARTMENT_OTHER): Payer: Commercial Managed Care - PPO | Admitting: Physical Therapy

## 2020-05-22 ENCOUNTER — Other Ambulatory Visit: Payer: Self-pay

## 2020-05-22 VITALS — BP 161/86

## 2020-05-22 DIAGNOSIS — M25571 Pain in right ankle and joints of right foot: Secondary | ICD-10-CM | POA: Diagnosis not present

## 2020-05-22 DIAGNOSIS — M25671 Stiffness of right ankle, not elsewhere classified: Secondary | ICD-10-CM

## 2020-05-22 DIAGNOSIS — R600 Localized edema: Secondary | ICD-10-CM

## 2020-05-22 DIAGNOSIS — M6281 Muscle weakness (generalized): Secondary | ICD-10-CM

## 2020-05-22 NOTE — Therapy (Signed)
[facility] [address] [place], [place], [address] Phone: [phone]   Fax:  [phone]  Physical Therapy Treatment  Patient Details  Name: Brittany Buckley MRN: [id number] Date of Birth: 1956-01-15 Referring Provider (PT): Wylene Simmer, MD   Encounter Date: 05/22/2020   PT End of Session - 05/22/20 1633    Visit Number 4    Number of Visits 7    Date for PT Re-Evaluation 06/01/20    Authorization Type UHC/UMR    PT Start Time 1600    PT Stop Time 1645    PT Time Calculation (min) 45 min    Activity Tolerance Patient tolerated treatment well    Behavior During Therapy [facility] for tasks assessed/performed           Past Medical History:  Diagnosis Date  . Adult ADHD   . Anxiety   . Arthritis    "hands, feet" (06/14/2016)  . Asthma   . Atrial fibrillation ([place])   . CAD (coronary artery disease)   . Chronic lower back pain   . Chronic neck pain   . Coronary artery disease    a. history of MI and prior stenting in ~2014/2015 in [place] (stents in LCx and RCA based on stable cath in 06/2018).  . Diabetes mellitus without complication ([place])   . Gastric ulcer   . GERD (gastroesophageal reflux disease)   . Headache    "monthly" (06/14/2016)  . Heart murmur   . High cholesterol   . History of blood transfusion 1983   Bleed after C-Section  . Hyperlipidemia   . Hypertension   . MI (myocardial infarction) ([place]) 2014; 2015  . Migraine    "probably twice/year" (06/14/2016)  . Myocardial infarction ([place])   . Numbness of toes   . Obesity   . On anticoagulant therapy   . Peripheral neuropathy   . Persistent atrial fibrillation ([place])   . Pneumonia 1990s  . Thyroid nodule   . Type II diabetes mellitus ([place])   . Urticaria     Past Surgical History:  Procedure Laterality Date  . ABDOMINAL HYSTERECTOMY    . ADENOIDECTOMY    . BIOPSY THYROID    . BREAST CYST EXCISION Left 01/21/2019   Procedure: EXCISION OF LEFT  BREAST MASS;  Surgeon: Jovita Kussmaul, MD;  Location: [facility];  Service: General;  Laterality: Left;  . BREAST EXCISIONAL BIOPSY Right   . CESAREAN SECTION    . CORONARY ANGIOPLASTY WITH STENT PLACEMENT  2014 & 2015  . CORONARY ANGIOPLASTY WITH STENT PLACEMENT    . LEFT HEART CATH AND CORONARY ANGIOGRAPHY N/A 07/09/2018   Procedure: LEFT HEART CATH AND CORONARY ANGIOGRAPHY;  Surgeon: Belva Crome, MD;  Location: [facility];  Service: Cardiovascular;  Laterality: N/A;  . LEFT HEART CATH AND CORONARY ANGIOGRAPHY N/A 04/09/2020   Procedure: LEFT HEART CATH AND CORONARY ANGIOGRAPHY;  Surgeon: Belva Crome, MD;  Location: [facility];  Service: Cardiovascular;  Laterality: N/A;  . TONSILLECTOMY    . TONSILLECTOMY AND ADENOIDECTOMY    . TRIGGER FINGER RELEASE Right    "pointer"    Vitals:   05/22/20 1603 05/22/20 1654  BP: (!) 170/86 (!) 161/86     Subjective Assessment - 05/22/20 1655    Subjective Pt reports walking in [place] with increased anterior shin & talus tightness and soreness. Pt states she went to see her PCP and got her BP meds adjusted. Pt reports in general  her ankle can be a 1/61 but certain movements can cause sharp pain.    Pertinent History CAD, DM, hyperlipidemia, a-fib, anxiety, arthritis, asthma, GERD, HTN, hyperlipidemia, MI    Limitations Standing;House hold activities    How long can you sit comfortably? n/a    How long can you stand comfortably? ~30 minutes    How long can you walk comfortably? No issues    Patient Stated Goals Relieve low back/hip pain and improve ankle motion    Currently in Pain? Yes    Pain Score 2     Pain Location Ankle              [facility] PT Assessment - 05/22/20 0001      AROM   Right Ankle Dorsiflexion 2    Right Ankle Plantar Flexion 45    Right Ankle Inversion 35    Right Ankle Eversion 15                         [facility] Adult PT Treatment/Exercise - 05/22/20 0001      Knee/Hip Exercises:  Stretches   Piriformis Stretch Right;2 reps;30 seconds    Piriformis Stretch Limitations Seated    Gastroc Stretch Right;Left;30 seconds    Gastroc Stretch Limitations standing    Soleus Stretch Right;Left;30 seconds    Soleus Stretch Limitations standing      Knee/Hip Exercises: Standing   Heel Raises Right;10 reps;2 sets    Heel Raises Limitations single leg    Hip Extension Stengthening;Left;Right;2 sets;10 reps;Knee straight    Extension Limitations orange tband    Other Standing Knee Exercises lateral band walk 3x10 orange; backwards band walk 2x10 (limited due to increased ankle pain)      Manual Therapy   Manual Therapy Soft tissue mobilization;Passive ROM    Soft tissue mobilization STW, TPR and IASTM of R ant tib and gastroc    Passive ROM PF and inversion to stretch ant tib                    PT Short Term Goals - 05/22/20 1656      PT SHORT TERM GOAL #1   Title Pt will be independent with initial HEP.    Time 3    Period Weeks    Status On-going    Target Date 05/11/20      PT SHORT TERM GOAL #2   Title Pt will have improved ankle DF to at least -8 deg    Baseline -15 deg; 2 deg 3/25    Time 3    Period Weeks    Status Achieved    Target Date 05/11/20      PT SHORT TERM GOAL #3   Title Pt will be able to maintain R LE SLS at least 5 sec to demo improved ankle stability    Baseline Currently unable to hold past 2 sec    Time 3    Period Weeks    Status On-going    Target Date 05/11/20             PT Long Term Goals - 04/20/20 1702      PT LONG TERM GOAL #1   Title Pt will be independent with final HEP    Time 6    Period Weeks    Status New    Target Date 06/01/20      PT LONG TERM GOAL #2   Title Pt will  be able to maintain SLS at least 10 sec on R LE to demo improved ankle stability    Time 6    Period Weeks    Status New    Target Date 06/01/20      PT LONG TERM GOAL #3   Title Pt will be able to stand as long as needed for  work without pain    Baseline Pt with R hip/back and ankle pain.    Time 6    Period Weeks    Status New    Target Date 06/01/20      PT LONG TERM GOAL #4   Title Pt will demo normal reciprocal gait pattern    Time 6    Period Weeks    Status New    Target Date 06/01/20                 Plan - 05/22/20 1657    Clinical Impression Statement Pt with improved ankle motion and edema. Treatment focused on LE strengthening and stability. Provided stretches and manual to address tightness after increased walking. BP elevated but stable throughout treatment -- pt asymptomatic. Pt is slowly meeting STGs.    Personal Factors and Comorbidities Age;Fitness;Time since onset of injury/illness/exacerbation    Examination-Activity Limitations Transfers;Stand;Stairs;Locomotion Level    Examination-Participation Restrictions Community Activity    Stability/Clinical Decision Making Stable/Uncomplicated    Rehab Potential Good    PT Frequency 1x / week    PT Duration 6 weeks    PT Treatment/Interventions Cryotherapy;Electrical Stimulation;ADLs/Self Care Home Management;Ultrasound;Gait training;Stair training;Functional mobility training;Therapeutic activities;Therapeutic exercise;Balance training;Neuromuscular re-education;Manual techniques;Patient/family education;Compression bandaging;Scar mobilization;Passive range of motion;Dry needling;Taping;Vasopneumatic Device    PT Next Visit Plan Check STGs! Assess response to HEP. Strengthen fibular and posterior tibialis muscles. Manual therapy to improve joint mobility. Work on hip strengthening. Gait training as indicated    PT Home Exercise Plan Access Code: [email or web address]    Consulted and Agree with Plan of Care Patient           Patient will benefit from skilled therapeutic intervention in order to improve the following deficits and impairments:  Abnormal gait,Decreased range of motion,Pain,Increased fascial restricitons,Decreased activity  tolerance,Decreased balance,Hypomobility,Impaired flexibility,Decreased mobility,Decreased strength,Increased edema,Impaired sensation  Visit Diagnosis: Pain in right ankle and joints of right foot  Stiffness of right ankle, not elsewhere classified  Localized edema  Muscle weakness (generalized)     Problem List Patient Active Problem List   Diagnosis Date Noted  . Angina pectoris ([place]) 07/09/2018  . Colitis presumed to be due to infection 12/22/2016  . CAD in native artery 12/22/2016  . DM2 (diabetes mellitus, type 2) ([place]) 12/22/2016  . Acute hypotension 12/22/2016  . Atrial fibrillation with rapid ventricular response ([place]) 06/14/2016  . Murmur, cardiac 03/21/2016  . Hypercholesteremia 03/21/2016    Eithan Beagle April Ma L Cruzito Standre PT, DPT 05/22/2020, 4:58 PM  [facility] [place], [place], [address] Phone: [phone]   Fax:  [phone]  Name: Brittany Buckley MRN: [id number] Date of Birth: 10-Oct-1955

## 2020-05-26 ENCOUNTER — Encounter (HOSPITAL_BASED_OUTPATIENT_CLINIC_OR_DEPARTMENT_OTHER): Payer: Commercial Managed Care - PPO | Admitting: Physical Therapy

## 2020-05-29 ENCOUNTER — Other Ambulatory Visit: Payer: Self-pay

## 2020-05-29 ENCOUNTER — Ambulatory Visit (HOSPITAL_BASED_OUTPATIENT_CLINIC_OR_DEPARTMENT_OTHER): Payer: Commercial Managed Care - PPO | Attending: Orthopedic Surgery | Admitting: Physical Therapy

## 2020-05-29 VITALS — BP 186/76

## 2020-05-29 DIAGNOSIS — M25571 Pain in right ankle and joints of right foot: Secondary | ICD-10-CM | POA: Diagnosis not present

## 2020-05-29 DIAGNOSIS — M6281 Muscle weakness (generalized): Secondary | ICD-10-CM | POA: Diagnosis present

## 2020-05-29 DIAGNOSIS — R600 Localized edema: Secondary | ICD-10-CM | POA: Diagnosis present

## 2020-05-29 DIAGNOSIS — M25671 Stiffness of right ankle, not elsewhere classified: Secondary | ICD-10-CM | POA: Diagnosis present

## 2020-05-29 NOTE — Therapy (Signed)
[facility] [address] [place], [place], [address] Phone: [phone]   Fax:  [phone]  Physical Therapy Treatment  Patient Details  Name: Brittany Buckley MRN: [id number] Date of Birth: 1955-06-09 Referring Provider (PT): Wylene Simmer, MD   Encounter Date: 05/29/2020   PT End of Session - 05/29/20 1557    Visit Number 5    Number of Visits 7    Date for PT Re-Evaluation 06/01/20    Authorization Type UHC/UMR    PT Start Time 1600    PT Stop Time 1645    PT Time Calculation (min) 45 min    Activity Tolerance Patient tolerated treatment well    Behavior During Therapy [facility] for tasks assessed/performed           Past Medical History:  Diagnosis Date  . Adult ADHD   . Anxiety   . Arthritis    "hands, feet" (06/14/2016)  . Asthma   . Atrial fibrillation ([place])   . CAD (coronary artery disease)   . Chronic lower back pain   . Chronic neck pain   . Coronary artery disease    a. history of MI and prior stenting in ~2014/2015 in [place] (stents in LCx and RCA based on stable cath in 06/2018).  . Diabetes mellitus without complication ([place])   . Gastric ulcer   . GERD (gastroesophageal reflux disease)   . Headache    "monthly" (06/14/2016)  . Heart murmur   . High cholesterol   . History of blood transfusion 1983   Bleed after C-Section  . Hyperlipidemia   . Hypertension   . MI (myocardial infarction) ([place]) 2014; 2015  . Migraine    "probably twice/year" (06/14/2016)  . Myocardial infarction ([place])   . Numbness of toes   . Obesity   . On anticoagulant therapy   . Peripheral neuropathy   . Persistent atrial fibrillation ([place])   . Pneumonia 1990s  . Thyroid nodule   . Type II diabetes mellitus ([place])   . Urticaria     Past Surgical History:  Procedure Laterality Date  . ABDOMINAL HYSTERECTOMY    . ADENOIDECTOMY    . BIOPSY THYROID    . BREAST CYST EXCISION Left 01/21/2019   Procedure: EXCISION OF LEFT  BREAST MASS;  Surgeon: Jovita Kussmaul, MD;  Location: [facility];  Service: General;  Laterality: Left;  . BREAST EXCISIONAL BIOPSY Right   . CESAREAN SECTION    . CORONARY ANGIOPLASTY WITH STENT PLACEMENT  2014 & 2015  . CORONARY ANGIOPLASTY WITH STENT PLACEMENT    . LEFT HEART CATH AND CORONARY ANGIOGRAPHY N/A 07/09/2018   Procedure: LEFT HEART CATH AND CORONARY ANGIOGRAPHY;  Surgeon: Belva Crome, MD;  Location: [facility];  Service: Cardiovascular;  Laterality: N/A;  . LEFT HEART CATH AND CORONARY ANGIOGRAPHY N/A 04/09/2020   Procedure: LEFT HEART CATH AND CORONARY ANGIOGRAPHY;  Surgeon: Belva Crome, MD;  Location: [facility];  Service: Cardiovascular;  Laterality: N/A;  . TONSILLECTOMY    . TONSILLECTOMY AND ADENOIDECTOMY    . TRIGGER FINGER RELEASE Right    "pointer"    Vitals:   05/29/20 1700  BP: (!) 186/76     Subjective Assessment - 05/29/20 1656    Subjective Pt reports continued dorsal foot numbness and increased pain especially with amb. Pt reports pain increased on Wednesday. Pt has been walking in grass and mud at work for the past 2 days. Pt has been performing  her exercises and husband has been helping with her stretching.    Pertinent History CAD, DM, hyperlipidemia, a-fib, anxiety, arthritis, asthma, GERD, HTN, hyperlipidemia, MI    Limitations Standing;House hold activities    How long can you sit comfortably? n/a    How long can you stand comfortably? ~30 minutes    How long can you walk comfortably? No issues    Patient Stated Goals Relieve low back/hip pain and improve ankle motion    Currently in Pain? Yes    Pain Score 5     Pain Location Ankle    Pain Orientation Right                             [facility] Adult PT Treatment/Exercise - 05/29/20 0001      Manual Therapy   Manual Therapy Soft tissue mobilization;Passive ROM;Joint mobilization    Joint Mobilization grade III talocrural distraction; grade III PA talus mob; MWM  talocural PA/AP mobs    Soft tissue mobilization STW and TPR R ant tib, gastroc, peroneals    Passive ROM PF and inversion to stretch ant tib      Ankle Exercises: Stretches   Soleus Stretch 2 reps;20 seconds    Gastroc Stretch 2 reps;20 seconds    Other Stretch Peroneal nerve mobilization x10      Ankle Exercises: Standing   Other Standing Ankle Exercises Heel to toe x10 bilat            Trigger Point Dry Needling - 05/29/20 0001    Consent Given? Yes    Education Handout Provided Yes    Muscles Treated Lower Quadrant Anterior tibialis;Gastrocnemius    Dry Needling Comments Performed by Selinda Eon    Anterior tibialis Response Twitch response elicited;Palpable increased muscle length    Gastrocnemius Response Twitch response elicited;Palpable increased muscle length                  PT Short Term Goals - 05/22/20 1656      PT SHORT TERM GOAL #1   Title Pt will be independent with initial HEP.    Time 3    Period Weeks    Status On-going    Target Date 05/11/20      PT SHORT TERM GOAL #2   Title Pt will have improved ankle DF to at least -8 deg    Baseline -15 deg; 2 deg 3/25    Time 3    Period Weeks    Status Achieved    Target Date 05/11/20      PT SHORT TERM GOAL #3   Title Pt will be able to maintain R LE SLS at least 5 sec to demo improved ankle stability    Baseline Currently unable to hold past 2 sec    Time 3    Period Weeks    Status On-going    Target Date 05/11/20             PT Long Term Goals - 04/20/20 1702      PT LONG TERM GOAL #1   Title Pt will be independent with final HEP    Time 6    Period Weeks    Status New    Target Date 06/01/20      PT LONG TERM GOAL #2   Title Pt will be able to maintain SLS at least 10 sec on R LE to demo improved ankle stability  Time 6    Period Weeks    Status New    Target Date 06/01/20      PT LONG TERM GOAL #3   Title Pt will be able to stand as long as needed for work  without pain    Baseline Pt with R hip/back and ankle pain.    Time 6    Period Weeks    Status New    Target Date 06/01/20      PT LONG TERM GOAL #4   Title Pt will demo normal reciprocal gait pattern    Time 6    Period Weeks    Status New    Target Date 06/01/20                 Plan - 05/29/20 1703    Clinical Impression Statement Pt with less edema this session; however, increased R ankle pain likely due to overuse (pt had performed all her ankle exercises and standing/walking on compliant surfaces). Treatment session focused primarily on stretching and manual therapy. Performed gentle ankle AROM and added nerve stretches as pt continues to have nerve pain.    Personal Factors and Comorbidities Age;Fitness;Time since onset of injury/illness/exacerbation    Examination-Activity Limitations Transfers;Stand;Stairs;Locomotion Level    Examination-Participation Restrictions Community Activity    Stability/Clinical Decision Making Stable/Uncomplicated    Rehab Potential Good    PT Frequency 1x / week    PT Duration 6 weeks    PT Treatment/Interventions Cryotherapy;Electrical Stimulation;ADLs/Self Care Home Management;Ultrasound;Gait training;Stair training;Functional mobility training;Therapeutic activities;Therapeutic exercise;Balance training;Neuromuscular re-education;Manual techniques;Patient/family education;Compression bandaging;Scar mobilization;Passive range of motion;Dry needling;Taping;Vasopneumatic Device    PT Next Visit Plan Assess response to HEP. Strengthen fibular and posterior tibialis muscles. Manual therapy to improve joint mobility. Work on hip strengthening. Gait training as indicated    PT Home Exercise Plan Access Code: [email or web address]    Consulted and Agree with Plan of Care Patient           Patient will benefit from skilled therapeutic intervention in order to improve the following deficits and impairments:  Abnormal gait,Decreased range of  motion,Pain,Increased fascial restricitons,Decreased activity tolerance,Decreased balance,Hypomobility,Impaired flexibility,Decreased mobility,Decreased strength,Increased edema,Impaired sensation  Visit Diagnosis: Pain in right ankle and joints of right foot  Stiffness of right ankle, not elsewhere classified  Localized edema  Muscle weakness (generalized)     Problem List Patient Active Problem List   Diagnosis Date Noted  . Angina pectoris ([place]) 07/09/2018  . Colitis presumed to be due to infection 12/22/2016  . CAD in native artery 12/22/2016  . DM2 (diabetes mellitus, type 2) ([place]) 12/22/2016  . Acute hypotension 12/22/2016  . Atrial fibrillation with rapid ventricular response ([place]) 06/14/2016  . Murmur, cardiac 03/21/2016  . Hypercholesteremia 03/21/2016    Jorie Zee April Ma L Rossi Burdo PT, DPT 05/29/2020, 5:05 PM  [facility] [address] [place], [place], [address] Phone: [phone]   Fax:  [phone]  Name: Brittany Buckley MRN: [id number] Date of Birth: Jul 13, 1955

## 2020-06-02 ENCOUNTER — Ambulatory Visit (HOSPITAL_BASED_OUTPATIENT_CLINIC_OR_DEPARTMENT_OTHER): Payer: Commercial Managed Care - PPO | Admitting: Physical Therapy

## 2020-06-05 ENCOUNTER — Other Ambulatory Visit: Payer: Self-pay

## 2020-06-05 ENCOUNTER — Ambulatory Visit (HOSPITAL_BASED_OUTPATIENT_CLINIC_OR_DEPARTMENT_OTHER): Payer: Commercial Managed Care - PPO | Admitting: Physical Therapy

## 2020-06-05 VITALS — BP 178/105

## 2020-06-05 DIAGNOSIS — M25671 Stiffness of right ankle, not elsewhere classified: Secondary | ICD-10-CM

## 2020-06-05 DIAGNOSIS — R600 Localized edema: Secondary | ICD-10-CM

## 2020-06-05 DIAGNOSIS — M25571 Pain in right ankle and joints of right foot: Secondary | ICD-10-CM | POA: Diagnosis not present

## 2020-06-05 DIAGNOSIS — M6281 Muscle weakness (generalized): Secondary | ICD-10-CM

## 2020-06-05 NOTE — Therapy (Signed)
[facility] [address] [place], [place], [address] Phone: [phone]   Fax:  [phone]  Physical Therapy Treatment  Patient Details  Name: Brittany Buckley MRN: [id number] Date of Birth: [birth year] Referring Provider (PT): Wylene Simmer, MD   Encounter Date: 06/05/2020   PT End of Session - 06/05/20 1614    Visit Number 6    Number of Visits 7    Date for PT Re-Evaluation 06/01/20    Authorization Type UHC/UMR    PT Start Time 2458    PT Stop Time 1635    PT Time Calculation (min) 42 min    Activity Tolerance Patient tolerated treatment well    Behavior During Therapy [facility] for tasks assessed/performed           Past Medical History:  Diagnosis Date  . Adult ADHD   . Anxiety   . Arthritis    "hands, feet" (06/14/2016)  . Asthma   . Atrial fibrillation ([place])   . CAD (coronary artery disease)   . Chronic lower back pain   . Chronic neck pain   . Coronary artery disease    a. history of MI and prior stenting in ~2014/2015 in [place] (stents in LCx and RCA based on stable cath in 06/2018).  . Diabetes mellitus without complication ([place])   . Gastric ulcer   . GERD (gastroesophageal reflux disease)   . Headache    "monthly" (06/14/2016)  . Heart murmur   . High cholesterol   . History of blood transfusion 1983   Bleed after C-Section  . Hyperlipidemia   . Hypertension   . MI (myocardial infarction) ([place]) 2014; 2015  . Migraine    "probably twice/year" (06/14/2016)  . Myocardial infarction ([place])   . Numbness of toes   . Obesity   . On anticoagulant therapy   . Peripheral neuropathy   . Persistent atrial fibrillation ([place])   . Pneumonia 1990s  . Thyroid nodule   . Type II diabetes mellitus ([place])   . Urticaria     Past Surgical History:  Procedure Laterality Date  . ABDOMINAL HYSTERECTOMY    . ADENOIDECTOMY    . BIOPSY THYROID    . BREAST CYST EXCISION Left 01/21/2019   Procedure: EXCISION OF LEFT  BREAST MASS;  Surgeon: Jovita Kussmaul, MD;  Location: [facility];  Service: General;  Laterality: Left;  . BREAST EXCISIONAL BIOPSY Right   . CESAREAN SECTION    . CORONARY ANGIOPLASTY WITH STENT PLACEMENT  2014 & 2015  . CORONARY ANGIOPLASTY WITH STENT PLACEMENT    . LEFT HEART CATH AND CORONARY ANGIOGRAPHY N/A 07/09/2018   Procedure: LEFT HEART CATH AND CORONARY ANGIOGRAPHY;  Surgeon: Belva Crome, MD;  Location: [facility];  Service: Cardiovascular;  Laterality: N/A;  . LEFT HEART CATH AND CORONARY ANGIOGRAPHY N/A 04/09/2020   Procedure: LEFT HEART CATH AND CORONARY ANGIOGRAPHY;  Surgeon: Belva Crome, MD;  Location: [facility];  Service: Cardiovascular;  Laterality: N/A;  . TONSILLECTOMY    . TONSILLECTOMY AND ADENOIDECTOMY    . TRIGGER FINGER RELEASE Right    "pointer"    Vitals:   06/05/20 1615 06/05/20 1703  BP: (!) 198/105 (!) 178/105     Subjective Assessment - 06/05/20 1703    Subjective Pt reports increased anterior foot pain. Pt states she felt pretty okay for a few days after dry needling last session; however, on Wednesday pt went on school field trip [place]  which consisted of at least a mile or so of walking.    Pertinent History CAD, DM, hyperlipidemia, a-fib, anxiety, arthritis, asthma, GERD, HTN, hyperlipidemia, MI    Limitations Standing;House hold activities    How long can you sit comfortably? n/a    How long can you stand comfortably? ~30 minutes    How long can you walk comfortably? No issues    Patient Stated Goals Relieve low back/hip pain and improve ankle motion    Currently in Pain? Yes    Pain Score 5     Pain Location Ankle    Pain Orientation Right                             [facility] Adult PT Treatment/Exercise - 06/05/20 0001      Modalities   Modalities Vasopneumatic      Vasopneumatic   Number Minutes Vasopneumatic  25 minutes    Vasopnuematic Location  Ankle    Vasopneumatic Pressure Medium     Vasopneumatic Temperature  34      Manual Therapy   Manual Therapy Soft tissue mobilization;Passive ROM;Joint mobilization    Joint Mobilization grade III talocrural distraction; grade III PA talus mob; MWM talocural PA/AP mobs    Soft tissue mobilization STW and TPR R ant tib, gastroc, peroneals    Passive ROM PF and inversion to stretch ant tib      Ankle Exercises: Stretches   Soleus Stretch 2 reps;20 seconds    Gastroc Stretch 2 reps;20 seconds                    PT Short Term Goals - 05/22/20 1656      PT SHORT TERM GOAL #1   Title Pt will be independent with initial HEP.    Time 3    Period Weeks    Status On-going    Target Date 05/11/20      PT SHORT TERM GOAL #2   Title Pt will have improved ankle DF to at least -8 deg    Baseline -15 deg; 2 deg 3/25    Time 3    Period Weeks    Status Achieved    Target Date 05/11/20      PT SHORT TERM GOAL #3   Title Pt will be able to maintain R LE SLS at least 5 sec to demo improved ankle stability    Baseline Currently unable to hold past 2 sec    Time 3    Period Weeks    Status On-going    Target Date 05/11/20             PT Long Term Goals - 06/05/20 1705      PT LONG TERM GOAL #1   Title Pt will be independent with final HEP    Time 6    Period Weeks    Status On-going    Target Date 07/17/20      PT LONG TERM GOAL #2   Title Pt will be able to maintain SLS at least 10 sec on R LE to demo improved ankle stability    Time 6    Period Weeks    Status On-going    Target Date 07/17/20      PT LONG TERM GOAL #3   Title Pt will be able to stand as long as needed for work without pain    Baseline Pt with R  hip/back and ankle pain.    Time 6    Period Weeks    Status On-going    Target Date 07/17/20      PT LONG TERM GOAL #4   Title Pt will demo normal reciprocal gait pattern    Time 6    Period Weeks    Status On-going    Target Date 07/17/20                 Plan - 06/05/20 1658     Clinical Impression Statement Pt with increased pain this session. BP elevated once again. Ankle s/s consistent with anterior tibialis tendinonitis and achilles tendonitis from overuse (pt chaperoned field trip to [employer]). Treatment session mostly focused on ice, elevation, compression, stretching and manual therapy. 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 Pt states she will wear her ankle brace. Pt has not yet met goals limited due to BP, tendonitis, inflammation and pain in the last few weeks. Pt will benefit from continued PT to improve tendonitis pain.    Personal Factors and Comorbidities Age;Fitness;Time since onset of injury/illness/exacerbation    Examination-Activity Limitations Transfers;Stand;Stairs;Locomotion Level    Examination-Participation Restrictions Community Activity    Stability/Clinical Decision Making Stable/Uncomplicated    Rehab Potential Good    PT Frequency 1x / week    PT Duration 6 weeks    PT Treatment/Interventions Cryotherapy;Electrical Stimulation;ADLs/Self Care Home Management;Ultrasound;Gait training;Stair training;Functional mobility training;Therapeutic activities;Therapeutic exercise;Balance training;Neuromuscular re-education;Manual techniques;Patient/family education;Compression bandaging;Scar mobilization;Passive range of motion;Dry needling;Taping;Vasopneumatic Device    PT Next Visit Plan ALWAYS CHECK BP! Assess response to HEP. Strengthen ankle muscles. Manual therapy to improve joint mobility. Work on hip strengthening. Gait training as indicated. Vaso as needed.    PT Home Exercise Plan Access Code: [email or web address]    Consulted and Agree with Plan of Care Patient           Patient will benefit from skilled therapeutic intervention in order to improve the following deficits and impairments:  Abnormal gait,Decreased range of motion,Pain,Increased fascial restricitons,Decreased activity tolerance,Decreased  balance,Hypomobility,Impaired flexibility,Decreased mobility,Decreased strength,Increased edema,Impaired sensation  Visit Diagnosis: Pain in right ankle and joints of right foot  Stiffness of right ankle, not elsewhere classified  Localized edema  Muscle weakness (generalized)     Problem List Patient Active Problem List   Diagnosis Date Noted  . Angina pectoris ([place]) 07/09/2018  . Colitis presumed to be due to infection 12/22/2016  . CAD in native artery 12/22/2016  . DM2 (diabetes mellitus, type 2) ([place]) 12/22/2016  . Acute hypotension 12/22/2016  . Atrial fibrillation with rapid ventricular response ([place]) 06/14/2016  . Murmur, cardiac 03/21/2016  . Hypercholesteremia 03/21/2016    Gellen April Ma L Nonato PT, DPT 06/05/2020, 5:09 PM  [facility] [address] [place], [place], [address] Phone: [phone]   Fax:  [phone]  Name: Brittany Buckley MRN: [id number] Date of Birth: 1955-09-02

## 2020-06-19 ENCOUNTER — Ambulatory Visit (HOSPITAL_BASED_OUTPATIENT_CLINIC_OR_DEPARTMENT_OTHER): Payer: Commercial Managed Care - PPO | Admitting: Physical Therapy

## 2020-06-19 ENCOUNTER — Other Ambulatory Visit: Payer: Self-pay

## 2020-06-19 ENCOUNTER — Emergency Department (HOSPITAL_BASED_OUTPATIENT_CLINIC_OR_DEPARTMENT_OTHER)
Admission: EM | Admit: 2020-06-19 | Discharge: 2020-06-19 | Disposition: A | Discharge status: Home / Self Care | Payer: Commercial Managed Care - PPO | Attending: Emergency Medicine | Admitting: Emergency Medicine

## 2020-06-19 VITALS — BP 220/112

## 2020-06-19 DIAGNOSIS — I25119 Atherosclerotic heart disease of native coronary artery with unspecified angina pectoris: Secondary | ICD-10-CM | POA: Diagnosis not present

## 2020-06-19 DIAGNOSIS — Z7984 Long term (current) use of oral hypoglycemic drugs: Secondary | ICD-10-CM | POA: Diagnosis not present

## 2020-06-19 DIAGNOSIS — Z79899 Other long term (current) drug therapy: Secondary | ICD-10-CM | POA: Diagnosis not present

## 2020-06-19 DIAGNOSIS — J45909 Unspecified asthma, uncomplicated: Secondary | ICD-10-CM | POA: Diagnosis not present

## 2020-06-19 DIAGNOSIS — M6281 Muscle weakness (generalized): Secondary | ICD-10-CM

## 2020-06-19 DIAGNOSIS — I10 Essential (primary) hypertension: Secondary | ICD-10-CM | POA: Diagnosis not present

## 2020-06-19 DIAGNOSIS — R600 Localized edema: Secondary | ICD-10-CM

## 2020-06-19 DIAGNOSIS — M25571 Pain in right ankle and joints of right foot: Secondary | ICD-10-CM

## 2020-06-19 DIAGNOSIS — M25671 Stiffness of right ankle, not elsewhere classified: Secondary | ICD-10-CM

## 2020-06-19 DIAGNOSIS — E119 Type 2 diabetes mellitus without complications: Secondary | ICD-10-CM | POA: Diagnosis not present

## 2020-06-19 LAB — BASIC METABOLIC PANEL
Anion gap: 9 (ref 5–15)
BUN: 21 mg/dL (ref 8–23)
CO2: 26 mmol/L (ref 22–32)
Calcium: 9.3 mg/dL (ref 8.9–10.3)
Chloride: 107 mmol/L (ref 98–111)
Creatinine, Ser: 0.87 mg/dL (ref 0.44–1.00)
GFR, Estimated: >60 mL/min (ref >60)
Glucose, Bld: 95 mg/dL (ref 70–99)
Potassium: 3.9 mmol/L (ref 3.5–5.1)
Sodium: 142 mmol/L (ref 135–145)

## 2020-06-19 LAB — CBC
HCT: 39.4 % (ref 36.0–46.0)
Hemoglobin: 12.9 g/dL (ref 12.0–15.0)
MCH: 28.7 pg (ref 26.0–34.0)
MCHC: 32.7 g/dL (ref 30.0–36.0)
MCV: 87.8 fL (ref 80.0–100.0)
Platelets: 185 10*3/uL (ref 150–400)
RBC: 4.49 MIL/uL (ref 3.87–5.11)
RDW: 12.6 % (ref 11.5–15.5)
WBC: 5.8 10*3/uL (ref 4.0–10.5)
nRBC: 0 % (ref 0.0–0.2)

## 2020-06-19 MED ORDER — AMLODIPINE BESYLATE 5 MG PO TABS
2.5000 mg | ORAL_TABLET | Freq: Once | ORAL | Status: AC
  Administered 2020-06-19: 2.5 mg via ORAL
  Filled 2020-06-19: qty 1

## 2020-06-19 MED ORDER — AMLODIPINE BESYLATE 5 MG PO TABS: 5.0000 mg | ORAL_TABLET | Freq: Every day | ORAL | 0 refills | Status: DC

## 2020-06-19 NOTE — ED Provider Notes (Signed)
[facility] EMERGENCY DEPT Provider Note   CSN: [id number] Arrival date & time: 06/19/20  1612     History Chief Complaint  Patient presents with  . Hypertension    Brittany Buckley is a 65 y.o. female.  65yo F w/ PMH including CAD s/p stenting, A fib on anticoagulation, T2DM, HLD, HTN who p/w hypertension.  Patient states that she went for physical therapy appointment today in the clinic upstairs and was noted to have severely elevated blood pressure at 123456 systolic. She does note that she just received bad news just prior to her appointment. She was sent to ED for further eval. she reports compliance with her medications and took all of her medicines this morning.  No recent changes to her medications.  She has a very slight frontal headache but no severe headache, vision changes, or focal weakness.  No chest pain.  She states that she has some intermittent wheezing at night, has a history of asthma.  No URI symptoms.  No nausea or vomiting.  She has a blood pressure cuff at home and has noticed that her blood pressure has been running higher lately, the best number she has gotten at home is [id number] systolic.  She has not yet contacted her PCP for eval.  The history is provided by the patient.  Hypertension       Past Medical History:  Diagnosis Date  . Adult ADHD   . Anxiety   . Arthritis    "hands, feet" (06/14/2016)  . Asthma   . Atrial fibrillation ([place])   . CAD (coronary artery disease)   . Chronic lower back pain   . Chronic neck pain   . Coronary artery disease    a. history of MI and prior stenting in ~2014/2015 in [place] (stents in LCx and RCA based on stable cath in 06/2018).  . Diabetes mellitus without complication ([place])   . Gastric ulcer   . GERD (gastroesophageal reflux disease)   . Headache    "monthly" (06/14/2016)  . Heart murmur   . High cholesterol   . History of blood transfusion 1983   Bleed after C-Section  . Hyperlipidemia   .  Hypertension   . MI (myocardial infarction) ([place]) 2014; 2015  . Migraine    "probably twice/year" (06/14/2016)  . Myocardial infarction ([place])   . Numbness of toes   . Obesity   . On anticoagulant therapy   . Peripheral neuropathy   . Persistent atrial fibrillation ([place])   . Pneumonia 1990s  . Thyroid nodule   . Type II diabetes mellitus ([place])   . Urticaria     Patient Active Problem List   Diagnosis Date Noted  . Angina pectoris ([place]) 07/09/2018  . Colitis presumed to be due to infection 12/22/2016  . CAD in native artery 12/22/2016  . DM2 (diabetes mellitus, type 2) ([place]) 12/22/2016  . Acute hypotension 12/22/2016  . Atrial fibrillation with rapid ventricular response ([place]) 06/14/2016  . Murmur, cardiac 03/21/2016  . Hypercholesteremia 03/21/2016    Past Surgical History:  Procedure Laterality Date  . ABDOMINAL HYSTERECTOMY    . ADENOIDECTOMY    . BIOPSY THYROID    . BREAST CYST EXCISION Left 01/21/2019   Procedure: EXCISION OF LEFT BREAST MASS;  Surgeon: Jovita Kussmaul, MD;  Location: [facility];  Service: General;  Laterality: Left;  . BREAST EXCISIONAL BIOPSY Right   . CESAREAN SECTION    . CORONARY ANGIOPLASTY WITH STENT PLACEMENT  2014 & 2015  .  CORONARY ANGIOPLASTY WITH STENT PLACEMENT    . LEFT HEART CATH AND CORONARY ANGIOGRAPHY N/A 07/09/2018   Procedure: LEFT HEART CATH AND CORONARY ANGIOGRAPHY;  Surgeon: Belva Crome, MD;  Location: [facility];  Service: Cardiovascular;  Laterality: N/A;  . LEFT HEART CATH AND CORONARY ANGIOGRAPHY N/A 04/09/2020   Procedure: LEFT HEART CATH AND CORONARY ANGIOGRAPHY;  Surgeon: Belva Crome, MD;  Location: [facility];  Service: Cardiovascular;  Laterality: N/A;  . TONSILLECTOMY    . TONSILLECTOMY AND ADENOIDECTOMY    . TRIGGER FINGER RELEASE Right    "pointer"     OB History   No obstetric history on file.     Family History  Problem Relation Age of Onset  . Hyperlipidemia Mother   . Colon polyps Mother    . CAD Father   . Heart attack Father   . Cancer - Ovarian Sister   . Diabetes Maternal Grandmother        blindness  . Heart disease Paternal Grandmother   . Diabetes Paternal Grandfather        with amputation  . Healthy Son   . Obesity Daughter     Social History   Tobacco Use  . Smoking status: Never Smoker  . Smokeless tobacco: Never Used  Substance Use Topics  . Alcohol use: Not Currently  . Drug use: Not Currently    Home Medications Prior to Admission medications   Medication Sig Start Date End Date Taking? Authorizing Provider  amLODipine (NORVASC) 5 MG tablet Take 1 tablet (5 mg total) by mouth daily. 06/19/20  Yes Selam Pietsch, Wenda Overland, MD  acetaminophen (TYLENOL) 500 MG tablet Take 1,000-1,500 mg by mouth every 8 (eight) hours as needed for headache (pain).    [provider]  albuterol (VENTOLIN HFA) 108 (90 Base) MCG/ACT inhaler Inhale 2 puffs into the lungs every 6 (six) hours as needed for wheezing or shortness of breath.  12/02/19   [provider]  atorvastatin (LIPITOR) 80 MG tablet Take 80 mg by mouth daily. 01/16/20   [provider]  buPROPion (WELLBUTRIN XL) 150 MG 24 hr tablet Take 150 mg by mouth daily. 01/16/20   [provider]  calcium carbonate (TUMS - DOSED IN MG ELEMENTAL CALCIUM) 500 MG chewable tablet Chew 1,000 mg by mouth daily as needed for indigestion or heartburn.    [provider]  carvedilol (COREG) 3.125 MG tablet Take 3.125 mg by mouth 2 (two) times daily.    [provider]  diphenhydrAMINE (BENADRYL) 25 MG tablet Take 25 mg by mouth every 6 (six) hours as needed for itching or allergies.     [provider]  dronedarone (MULTAQ) 400 MG tablet Take 1 tablet (400 mg total) by mouth 2 (two) times daily with a meal. 02/01/19   Daune Perch, NP  hydrocortisone cream 1 % Apply topically as needed for itching. Patient taking differently: Apply 1 application topically as needed for  itching. 06/17/16   Baldwin Jamaica, PA-C  isosorbide mononitrate (IMDUR) 30 MG 24 hr tablet Take 30 mg by mouth daily. 01/16/20   [provider]  JARDIANCE 25 MG TABS tablet Take 25 mg by mouth daily. 03/05/18   [provider]  lisinopril (ZESTRIL) 40 MG tablet Take 1 tablet (40 mg total) by mouth daily. 01/27/20   Nahser, Wonda Cheng, MD  Melatonin 3 MG CAPS Take 3-6 mg by mouth at bedtime as needed (sleep).    [provider]  metFORMIN (GLUCOPHAGE) 1000 MG tablet Take 1,000 mg by mouth 2 (two) times daily. 01/16/20   [provider]  nitroGLYCERIN (NITROSTAT) 0.4 MG SL tablet Place 0.4 mg under the tongue every 5 (five) minutes as needed for chest pain.  09/13/19   [provider]  omeprazole (PRILOSEC) 40 MG capsule Take 40 mg by mouth daily as needed (acid reflux).  01/16/20   [provider]  PRADAXA 150 MG CAPS capsule TAKE ONE CAPSULE BY MOUTH TWICE DAILY Patient taking differently: Take 150 mg by mouth 2 (two) times daily. 12/17/19   Nahser, Wonda Cheng, MD    Allergies    Codeine, Onion, Tomato, Chocolate, and Demerol [meperidine]  Review of Systems   Review of Systems All other systems reviewed and are negative except that which was mentioned in HPI  Physical Exam Updated Vital Signs BP (!) 189/81   Pulse 66   Temp 97.7 F (36.5 C) (Oral)   Resp 17   Ht 5\' 8"  (1.727 m)   Wt 90 kg   SpO2 98%   BMI 30.17 kg/m   Physical Exam Constitutional:      General: She is not in acute distress.    Appearance: Normal appearance.  HENT:     Head: Normocephalic and atraumatic.  Eyes:     Extraocular Movements: Extraocular movements intact.     Conjunctiva/sclera: Conjunctivae normal.     Pupils: Pupils are equal, round, and reactive to light.  Cardiovascular:     Rate and Rhythm: Normal rate and regular rhythm.     Heart sounds: Normal heart sounds. No murmur heard.   Pulmonary:     Effort: Pulmonary effort is normal.      Breath sounds: Normal breath sounds.  Abdominal:     General: Abdomen is flat. Bowel sounds are normal. There is no distension.     Palpations: Abdomen is soft.     Tenderness: There is no abdominal tenderness.  Musculoskeletal:     Right lower leg: No edema.     Left lower leg: No edema.  Skin:    General: Skin is warm and dry.  Neurological:     Mental Status: She is alert and oriented to person, place, and time.     Cranial Nerves: No cranial nerve deficit.     Motor: No weakness.     Comments: fluent  Psychiatric:        Behavior: Behavior normal.     Comments: anxious     ED Results / Procedures / Treatments   Labs (all labs ordered are listed, but only abnormal results are displayed) Labs Reviewed  BASIC METABOLIC PANEL  CBC    EKG EKG Interpretation  Date/Time:  Friday June 19 2020 16:32:52 EDT Ventricular Rate:  61 PR Interval:  159 QRS Duration: 80 QT Interval:  458 QTC Calculation: 462 R Axis:   82 Text Interpretation: Sinus rhythm Atrial premature complex Left atrial enlargement Borderline right axis deviation occasionalPAC but otherwise similar to previous Confirmed by Theotis Burrow [phone]) on 06/19/2020 5:00:40 PM   Radiology No results found.  Procedures Procedures   Medications Ordered in ED Medications  amLODipine (NORVASC) tablet 2.5 mg (2.5 mg Oral Given 06/19/20 1829)    ED Course  I have reviewed the triage vital signs and the nursing notes.  Pertinent labs that were available during my care of the patient were reviewed by me and considered in my medical decision making (see chart for details).    MDM  Rules/Calculators/A&P                          Alert, comfortable on exam, neuro intact, hypertensive 123456 systolic. No CP, EKG reassuring. No neuro symptoms to suggest PRES. Creatinine normal. No evidence of hypertensive emergency based on w/u and symptoms currently. Discussed medication regimen w/ cards, Dr. Harrington Challenger, and we decided to  start amlodipine 5mg  daily and have pt f/u with her cardiologist in the clinic.  I encouraged the patient to begin blood pressure journal and bring this information to her cardiologist when she follows up.  I have reviewed return precautions with her and she voiced understanding. Final Clinical Impression(s) / ED Diagnoses Final diagnoses:  Hypertension, unspecified type    Rx / DC Orders ED Discharge Orders         Ordered    amLODipine (NORVASC) 5 MG tablet  Daily        06/19/20 1814           Ebony Yorio, Wenda Overland, MD 06/19/20 2336

## 2020-06-19 NOTE — Therapy (Addendum)
[facility] [address] [place], [place], [address] Phone: [phone]   Fax:  [phone]  Physical Therapy Treatment - No Charge  Patient Details  Name: Brittany Buckley MRN: [id number] Date of Birth: 1955-08-07 Referring Provider (PT): Toni Arthurs, MD  PHYSICAL THERAPY DISCHARGE SUMMARY  Visits from Start of Care: 7  Current functional level related to goals / functional outcomes: Pt not seen for re-assessment due to hypertension. Pt to be called for rescheduling.   Remaining deficits: See below   Education / Equipment: See below  Plan: Patient agrees to discharge.  Patient goals were not met. Patient is being discharged due to not returning since last visit     Encounter Date: 06/19/2020   PT End of Session - 06/19/20 1629     Visit Number 7    Number of Visits 7    Authorization Type UHC/UMR    PT Start Time 1600    PT Stop Time 1615    PT Time Calculation (min) 15 min    Activity Tolerance Patient tolerated treatment well    Behavior During Therapy WFL for tasks assessed/performed             Past Medical History:  Diagnosis Date   Adult ADHD    Anxiety    Arthritis    "hands, feet" (06/14/2016)   Asthma    Atrial fibrillation (HCC)    CAD (coronary artery disease)    Chronic lower back pain    Chronic neck pain    Coronary artery disease    a. history of MI and prior stenting in ~2014/2015 in [place] (stents in LCx and RCA based on stable cath in 06/2018).   Diabetes mellitus without complication (HCC)    Gastric ulcer    GERD (gastroesophageal reflux disease)    Headache    "monthly" (06/14/2016)   Heart murmur    High cholesterol    History of blood transfusion 1983   Bleed after C-Section   Hyperlipidemia    Hypertension    MI (myocardial infarction) (HCC) 2014; 2015   Migraine    "probably twice/year" (06/14/2016)   Myocardial infarction (HCC)    Numbness of toes    Obesity     On anticoagulant therapy    Peripheral neuropathy    Persistent atrial fibrillation (HCC)    Pneumonia 1990s   Thyroid nodule    Type II diabetes mellitus (HCC)    Urticaria     Past Surgical History:  Procedure Laterality Date   ABDOMINAL HYSTERECTOMY     ADENOIDECTOMY     BIOPSY THYROID     BREAST CYST EXCISION Left 01/21/2019   Procedure: EXCISION OF LEFT BREAST MASS;  Surgeon: Griselda Miner, MD;  Location: [facility] OR;  Service: General;  Laterality: Left;   BREAST EXCISIONAL BIOPSY Right    CESAREAN SECTION     CORONARY ANGIOPLASTY WITH STENT PLACEMENT  2014 & 2015   CORONARY ANGIOPLASTY WITH STENT PLACEMENT     LEFT HEART CATH AND CORONARY ANGIOGRAPHY N/A 07/09/2018   Procedure: LEFT HEART CATH AND CORONARY ANGIOGRAPHY;  Surgeon: Lyn Records, MD;  Location: [facility];  Service: Cardiovascular;  Laterality: N/A;   LEFT HEART CATH AND CORONARY ANGIOGRAPHY N/A 04/09/2020   Procedure: LEFT HEART CATH AND CORONARY ANGIOGRAPHY;  Surgeon: Lyn Records, MD;  [facility]: [facility];  Service: Cardiovascular;  Laterality: N/A;   TONSILLECTOMY     TONSILLECTOMY AND ADENOIDECTOMY  TRIGGER FINGER RELEASE Right    "pointer"    Vitals:   06/19/20 1628  BP: (!) 220/112     Subjective Assessment - 06/19/20 1606     Subjective 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 Pt reports she's just rested her foot completely and it is feeling somewhat better.    Pertinent History CAD, DM, hyperlipidemia, a-fib, anxiety, arthritis, asthma, GERD, HTN, hyperlipidemia, MI    Limitations Standing;House hold activities    How long can you sit comfortably? n/a    How long can you stand comfortably? ~30 minutes    How long can you walk comfortably? No issues    Patient Stated Goals Relieve low back/hip pain and improve ankle motion                                         PT Short Term Goals - 05/22/20 1656       PT SHORT TERM  GOAL #1   Title Pt will be independent with initial HEP.    Time 3    Period Weeks    Status On-going    Target Date 05/11/20      PT SHORT TERM GOAL #2   Title Pt will have improved ankle DF to at least -8 deg    Baseline -15 deg; 2 deg 3/25    Time 3    Period Weeks    Status Achieved    Target Date 05/11/20      PT SHORT TERM GOAL #3   Title Pt will be able to maintain R LE SLS at least 5 sec to demo improved ankle stability    Baseline Currently unable to hold past 2 sec    Time 3    Period Weeks    Status On-going    Target Date 05/11/20               PT Long Term Goals - 06/05/20 1705       PT LONG TERM GOAL #1   Title Pt will be independent with final HEP    Time 6    Period Weeks    Status On-going    Target Date 07/17/20      PT LONG TERM GOAL #2   Title Pt will be able to maintain SLS at least 10 sec on R LE to demo improved ankle stability    Time 6    Period Weeks    Status On-going    Target Date 07/17/20      PT LONG TERM GOAL #3   Title Pt will be able to stand as long as needed for work without pain    Baseline Pt with R hip/back and ankle pain.    Time 6    Period Weeks    Status On-going    Target Date 07/17/20      PT LONG TERM GOAL #4   Title Pt will demo normal reciprocal gait pattern    Time 6    Period Weeks    Status On-going    Target Date 07/17/20                   Plan - 06/19/20 1628     Clinical Impression Statement Pt arrives with too elevated BP to participate in re-eval. PT will re-schedule for re-eval at a later time. Pt taken down  to ED.    Personal Factors and Comorbidities Age;Fitness;Time since onset of injury/illness/exacerbation    Examination-Activity Limitations Transfers;Stand;Stairs;Locomotion Level    Examination-Participation Restrictions Community Activity    Stability/Clinical Decision Making Stable/Uncomplicated    Rehab Potential Good    PT Frequency 1x / week    PT Duration 6 weeks     PT Treatment/Interventions Cryotherapy;Electrical Stimulation;ADLs/Self Care Home Management;Ultrasound;Gait training;Stair training;Functional mobility training;Therapeutic activities;Therapeutic exercise;Balance training;Neuromuscular re-education;Manual techniques;Patient/family education;Compression bandaging;Scar mobilization;Passive range of motion;Dry needling;Taping;Vasopneumatic Device    PT Next Visit Plan ALWAYS CHECK BP! Needs Re-evaluation! Assess response to HEP. Strengthen ankle muscles. Manual therapy to improve joint mobility. Work on hip strengthening. Gait training as indicated. Vaso as needed.    PT Home Exercise Plan Access Code: [email or web address]    Consulted and Agree with Plan of Care Patient             Patient will benefit from skilled therapeutic intervention in order to improve the following deficits and impairments:  Abnormal gait,Decreased range of motion,Pain,Increased fascial restricitons,Decreased activity tolerance,Decreased balance,Hypomobility,Impaired flexibility,Decreased mobility,Decreased strength,Increased edema,Impaired sensation  Visit Diagnosis: Pain in right ankle and joints of right foot  Stiffness of right ankle, not elsewhere classified  Localized edema  Muscle weakness (generalized)     Problem List Patient Active Problem List   Diagnosis Date Noted   Angina pectoris ([place]) 07/09/2018   Colitis presumed to be due to infection 12/22/2016   CAD in native artery 12/22/2016   DM2 (diabetes mellitus, type 2) ([place]) 12/22/2016   Acute hypotension 12/22/2016   Atrial fibrillation with rapid ventricular response ([place]) 06/14/2016   Murmur, cardiac 03/21/2016   Hypercholesteremia 03/21/2016    Lanah Steines [address] Bennett PT, DPT 06/19/2020, 4:31 PM  [facility] [place], [place], [address] Phone: [phone]   Fax:  [phone]  Name: Sarina Robleto MRN: [id number] Date of  Birth: 12/11/1955

## 2020-06-19 NOTE — ED Triage Notes (Signed)
Patient was upstairs and her blood pressure was "sky high, 200 something over 90 something"

## 2020-06-25 ENCOUNTER — Encounter: Payer: Self-pay | Admitting: Cardiovascular Disease

## 2020-06-25 NOTE — Progress Notes (Signed)
Cardiology Office Note:    Date:  06/26/2020   ID:  Brittany Buckley, DOB 1955/08/15, MRN [id number]  PCP:  Ginger Organ., MD  Cardiologist:  Mertie Moores, MD  Electrophysiologist:  Constance Haw, MD   Referring MD: Ginger Organ., MD   Chief Complaint  Patient presents with  . Coronary Artery Disease      History of Present Illness:    Brittany Buckley is a 65 y.o. female with a hx of  CAD, prior MI (prior stenting in ~2014/2015 in [place] (stents in LCx and RCA based on stable cath in 06/2018), ADHD, asthma, chronic neck/back pain, persistent AFib, HTN, HLD, gastric ulcer, migraines, obesity, DM  She was last seen by me in 2018 for CAD   .  Has been under lots of stress moving into a new home.   Felt her heart racing .   She was seen in the ER on June 04, 2019 for palpitations and was found to have Afib. Took Multaq early  She spontaneously converted back to sinus rhythm.  CHADS2VASC is 4.  She is on pradaxa.  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 She cut her Coreg to 3.125 a day .  No further episodes of CP .  ,  No angina  Is not exercising,  Walks her dog regularly  Is scheduled to see Dr. Brigitte Pulse this summer.   Feb. 4, 2022: Brittany Buckley is seen for follow up of her CAD, Atrial fib, HTN She broke her right ankle in Nov.    Still healing up from that  Has some chest pains - usually at night while at rest , radiates through to her back , pressure like sensation  Takes NTG with relief.  Not all that similar to her MI pain  Has been present intermittantly for 3 months ,  Has not worsened significantly  Does not seem to be exertional   June 26, 2020 Brittany Buckley is seen for follow up of her recent hospitalization  Hx of atrial fib, CAD Cath in Feb showed : LAD - mild irreg LCx - 50-60% stenosis at OM RCA, mild disease , patent RCA stent,   PDA has a 50-60% stenosis  Elevated LVEDP at 20 mmHg  She was recently seen in the ER with markedly elevated BP   Was recently found to have markedly elevated BP Was started on amlodipine. Started having headaches   Avoid salt   Wt is 202 lbs.   Past Medical History:  Diagnosis Date  . Adult ADHD   . Anxiety   . Arthritis    "hands, feet" (06/14/2016)  . Asthma   . Atrial fibrillation ([place])   . CAD (coronary artery disease)   . Chronic lower back pain   . Chronic neck pain   . Coronary artery disease    a. history of MI and prior stenting in ~2014/2015 in [place] (stents in LCx and RCA based on stable cath in 06/2018).  . Diabetes mellitus without complication ([place])   . Gastric ulcer   . GERD (gastroesophageal reflux disease)   . Headache    "monthly" (06/14/2016)  . Heart  murmur   . High cholesterol   . History of blood transfusion 1983   Bleed after C-Section  . Hyperlipidemia   . Hypertension   . MI (myocardial infarction) ([place]) 2014; 2015  . Migraine    "probably twice/year" (06/14/2016)  . Myocardial infarction ([place])   . Numbness of toes   . Obesity   . On anticoagulant therapy   . Peripheral neuropathy   . Persistent atrial fibrillation ([place])   . Pneumonia 1990s  . Thyroid nodule   . Type II diabetes mellitus ([place])   . Urticaria     Past Surgical History:  Procedure Laterality Date  . ABDOMINAL HYSTERECTOMY    . ADENOIDECTOMY    . BIOPSY THYROID    . BREAST CYST EXCISION Left 01/21/2019   Procedure: EXCISION OF LEFT BREAST MASS;  Surgeon: Jovita Kussmaul, MD;  Location: [facility];  Service: General;  Laterality: Left;  . BREAST EXCISIONAL BIOPSY Right   . CESAREAN SECTION    . CORONARY ANGIOPLASTY WITH STENT PLACEMENT  2014 & 2015  . CORONARY ANGIOPLASTY  WITH STENT PLACEMENT    . LEFT HEART CATH AND CORONARY ANGIOGRAPHY N/A 07/09/2018   Procedure: LEFT HEART CATH AND CORONARY ANGIOGRAPHY;  Surgeon: Belva Crome, MD;  Location: [facility];  Service: Cardiovascular;  Laterality: N/A;  . LEFT HEART CATH AND CORONARY ANGIOGRAPHY N/A 04/09/2020   Procedure: LEFT HEART CATH AND CORONARY ANGIOGRAPHY;  Surgeon: Belva Crome, MD;  Location: [facility];  Service: Cardiovascular;  Laterality: N/A;  . TONSILLECTOMY    . TONSILLECTOMY AND ADENOIDECTOMY    . TRIGGER FINGER RELEASE Right    "pointer"    Current Medications: Current Meds  Medication Sig  . acetaminophen (TYLENOL) 500 MG tablet Take 1,000-1,500 mg by mouth every 8 (eight) hours as needed for headache (pain).  Marland Kitchen albuterol (VENTOLIN HFA) 108 (90 Base) MCG/ACT inhaler Inhale 2 puffs into the lungs every 6 (six) hours as needed for wheezing or shortness of breath.   Marland Kitchen amLODipine (NORVASC) 5 MG tablet Take 1 tablet (5 mg total) by mouth daily.  Marland Kitchen atorvastatin (LIPITOR) 80 MG tablet Take 80 mg by mouth daily.  Marland Kitchen buPROPion (WELLBUTRIN XL) 150 MG 24 hr tablet Take 150 mg by mouth daily.  . calcium carbonate (TUMS - DOSED IN MG ELEMENTAL CALCIUM) 500 MG chewable tablet Chew 1,000 mg by mouth daily as needed for indigestion or heartburn.  . carvedilol (COREG) 3.125 MG tablet Take 3.125 mg by mouth 2 (two) times daily.  . chlorthalidone (HYGROTON) 25 MG tablet Take 1 tablet (25 mg total) by mouth daily.  . diphenhydrAMINE (BENADRYL) 25 MG tablet Take 25 mg by mouth every 6 (six) hours as needed for itching or allergies.   Marland Kitchen dronedarone (MULTAQ) 400 MG tablet Take 1 tablet (400 mg total) by mouth 2 (two) times daily with a meal.  . hydrocortisone cream 1 % Apply topically as needed for itching. (Patient taking differently: Apply 1 application topically as needed for itching.)  . isosorbide mononitrate (IMDUR) 30 MG 24 hr tablet Take 30 mg by mouth daily.  Marland Kitchen JARDIANCE 25 MG TABS tablet  Take 25 mg by mouth daily.  . Melatonin 3 MG CAPS Take 3-6 mg by mouth at bedtime as needed (sleep).  . metFORMIN (GLUCOPHAGE) 1000 MG tablet Take 1,000 mg by mouth 2 (two) times daily.  . nitroGLYCERIN (NITROSTAT) 0.4 MG SL tablet Place 0.4 mg under the tongue every 5 (five) minutes as  needed for chest pain.   Marland Kitchen omeprazole (PRILOSEC) 40 MG capsule Take 40 mg by mouth daily as needed (acid reflux).   . potassium chloride (KLOR-CON) 10 MEQ tablet Take 1 tablet (10 mEq total) by mouth 2 (two) times daily.  Marland Kitchen PRADAXA 150 MG CAPS capsule TAKE ONE CAPSULE BY MOUTH TWICE DAILY (Patient taking differently: Take 150 mg by mouth 2 (two) times daily.)  . valsartan (DIOVAN) 320 MG tablet Take 1 tablet (320 mg total) by mouth daily.  . [DISCONTINUED] lisinopril (ZESTRIL) 40 MG tablet Take 1 tablet (40 mg total) by mouth daily.     Allergies:   Codeine, Onion, Tomato, Chocolate, and Demerol [meperidine]   Social History   Socioeconomic History  . Marital status: Married    Spouse name: Not on file  . Number of children: Not on file  . Years of education: Not on file  . Highest education level: Not on file  Occupational History  . Not on file  Tobacco Use  . Smoking status: Never Smoker  . Smokeless tobacco: Never Used  Substance and Sexual Activity  . Alcohol use: Not Currently  . Drug use: Not Currently  . Sexual activity: Yes    Comment: married  Other Topics Concern  . Not on file  Social History Narrative   ** Merged History Encounter **       Social Determinants of Health   Financial Resource Strain: Not on file  Food Insecurity: Not on file  Transportation Needs: Not on file  Physical Activity: Not on file  Stress: Not on file  Social Connections: Not on file     Family History: The patient's family history includes CAD in her father; Cancer - Ovarian in her sister; Colon polyps in her mother; Diabetes in her maternal grandmother and paternal grandfather; Healthy in her son;  Heart attack in her father; Heart disease in her paternal grandmother; Hyperlipidemia in her mother; Obesity in her daughter.  ROS:   Please see the history of present illness.     All other systems reviewed and are negative.  EKGs/Labs/Other Studies Reviewed:       Recent Labs: 06/19/2020: BUN 21; Creatinine, Ser 0.87; Hemoglobin 12.9; Platelets 185; Potassium 3.9; Sodium 142  Recent Lipid Panel    Component Value Date/Time   CHOL 179 04/04/2016 1540   TRIG 180 (H) 04/04/2016 1540   HDL 47 04/04/2016 1540   CHOLHDL 3.8 04/04/2016 1540   LDLCALC 96 04/04/2016 1540    Physical Exam:    Physical Exam: Blood pressure (!) 156/72, pulse (!) 58, height 5\' 8"  (1.727 m), weight 202 lb (91.6 kg), SpO2 96 %.  GEN:   Moderately obese, NAD  HEENT: Normal NECK: No JVD; No carotid bruits LYMPHATICS: No lymphadenopathy CARDIAC: RRR , no murmurs, rubs, gallops RESPIRATORY:  Clear to auscultation without rales, wheezing or rhonchi  ABDOMEN: Soft, non-tender, non-distended MUSCULOSKELETAL:  No edema; No deformity  SKIN: Warm and dry NEUROLOGIC:  Alert and oriented x 3   EKG:       ASSESSMENT:    1. Murmur, cardiac   2. Essential hypertension    PLAN:      1.  Coronary artery disease:  No angina    2   PAF:     3.  Hyperlipidemia :   4.  HTN:  BP is selevated.  On amolipdine lisinpril 40 mg  A day   Will add cholrthalidone 25 a day , kdur 10 BID DC lisinopril ,  Start Valsartan 320 mg daily  BMP and HTN clinic in 2-3 weeks.    Medication Adjustments/Labs and Tests Ordered: Current medicines are reviewed at length with the patient today.  Concerns regarding medicines are outlined above.  Orders Placed This Encounter  Procedures  . Basic metabolic panel  . AMB Referral to [facility]  . ECHOCARDIOGRAM COMPLETE   Meds ordered this encounter  Medications  . valsartan (DIOVAN) 320 MG tablet    Sig: Take 1 tablet (320 mg total) by mouth daily.    Dispense:   90 tablet    Refill:  3  . chlorthalidone (HYGROTON) 25 MG tablet    Sig: Take 1 tablet (25 mg total) by mouth daily.    Dispense:  90 tablet    Refill:  3  . potassium chloride (KLOR-CON) 10 MEQ tablet    Sig: Take 1 tablet (10 mEq total) by mouth 2 (two) times daily.    Dispense:  180 tablet    Refill:  3    There are no Patient Instructions on file for this visit.   Signed, Mertie Moores, MD  06/26/2020 2:47 PM    [place]

## 2020-06-26 ENCOUNTER — Encounter: Payer: Self-pay | Admitting: Cardiovascular Disease

## 2020-06-26 ENCOUNTER — Other Ambulatory Visit: Payer: Self-pay

## 2020-06-26 ENCOUNTER — Ambulatory Visit (INDEPENDENT_AMBULATORY_CARE_PROVIDER_SITE_OTHER): Payer: Commercial Managed Care - PPO | Admitting: Cardiovascular Disease

## 2020-06-26 VITALS — BP 156/72 | HR 58 | Ht 68.0 in | Wt 202.0 lb

## 2020-06-26 DIAGNOSIS — R011 Cardiac murmur, unspecified: Secondary | ICD-10-CM | POA: Diagnosis not present

## 2020-06-26 DIAGNOSIS — I10 Essential (primary) hypertension: Secondary | ICD-10-CM | POA: Diagnosis not present

## 2020-06-26 MED ORDER — CHLORTHALIDONE 25 MG PO TABS: 25.0000 mg | ORAL_TABLET | Freq: Every day | ORAL | 3 refills | Status: DC

## 2020-06-26 MED ORDER — POTASSIUM CHLORIDE ER 10 MEQ PO TBCR: 10.0000 meq | EXTENDED_RELEASE_TABLET | Freq: Two times a day (BID) | ORAL | 3 refills | Status: AC

## 2020-06-26 MED ORDER — VALSARTAN 320 MG PO TABS: 320.0000 mg | ORAL_TABLET | Freq: Every day | ORAL | 3 refills | Status: DC

## 2020-06-26 NOTE — Patient Instructions (Signed)
Medication Instructions:  Your physician has recommended you make the following change in your medication:  START Chlorthalidone 25mg  daily  START potassium 24mEq twice a day START Valsartan 320mg  daily  STOP Lisinopril   *If you need a refill on your cardiac medications before your next appointment, please call your pharmacy*   Lab Work: BMET Your physician recommends that you return for lab work in:2-3 weeks  Testing/Procedures: Your physician has requested that you have an echocardiogram. Echocardiography is a painless test that uses sound waves to create images of your heart. It provides your doctor with information about the size and shape of your heart and how well your heart's chambers and valves are working. This procedure takes approximately one hour. There are no restrictions for this procedure.    Follow-Up: At [facility], you and your health needs are our priority.  As part of our continuing mission to provide you with exceptional heart care, we have created designated Provider Care Teams.  These Care Teams include your primary Cardiologist (physician) and Advanced Practice Providers (APPs -  Physician Assistants and Nurse Practitioners) who all work together to provide you with the care you need, when you need it.    Your next appointment:   3 month(s)  The format for your next appointment:   In Person  Provider:   You will see one of the following Advanced Practice Providers on your designated Care Team:    Richardson Dopp, PA-C  [facility] [place] [facility] [place], [place]   Other Instructions You have been referred to see a pharmacist in the hypertension clinic in 3-4 weeks.

## 2020-07-01 ENCOUNTER — Telehealth: Payer: Self-pay | Admitting: Physician Assistant

## 2020-07-01 ENCOUNTER — Emergency Department (HOSPITAL_BASED_OUTPATIENT_CLINIC_OR_DEPARTMENT_OTHER)
Admission: EM | Admit: 2020-07-01 | Discharge: 2020-07-01 | Disposition: A | Discharge status: Home / Self Care | Payer: Commercial Managed Care - PPO | Attending: Emergency Medicine | Admitting: Emergency Medicine

## 2020-07-01 ENCOUNTER — Encounter (HOSPITAL_BASED_OUTPATIENT_CLINIC_OR_DEPARTMENT_OTHER): Payer: Self-pay

## 2020-07-01 DIAGNOSIS — Z951 Presence of aortocoronary bypass graft: Secondary | ICD-10-CM | POA: Diagnosis not present

## 2020-07-01 DIAGNOSIS — I10 Essential (primary) hypertension: Secondary | ICD-10-CM | POA: Insufficient documentation

## 2020-07-01 DIAGNOSIS — R531 Weakness: Secondary | ICD-10-CM | POA: Diagnosis present

## 2020-07-01 DIAGNOSIS — Z79899 Other long term (current) drug therapy: Secondary | ICD-10-CM | POA: Diagnosis not present

## 2020-07-01 DIAGNOSIS — J45909 Unspecified asthma, uncomplicated: Secondary | ICD-10-CM | POA: Insufficient documentation

## 2020-07-01 DIAGNOSIS — E860 Dehydration: Secondary | ICD-10-CM | POA: Diagnosis not present

## 2020-07-01 DIAGNOSIS — Z7984 Long term (current) use of oral hypoglycemic drugs: Secondary | ICD-10-CM | POA: Insufficient documentation

## 2020-07-01 DIAGNOSIS — I25119 Atherosclerotic heart disease of native coronary artery with unspecified angina pectoris: Secondary | ICD-10-CM | POA: Diagnosis not present

## 2020-07-01 DIAGNOSIS — E1140 Type 2 diabetes mellitus with diabetic neuropathy, unspecified: Secondary | ICD-10-CM | POA: Insufficient documentation

## 2020-07-01 DIAGNOSIS — I952 Hypotension due to drugs: Secondary | ICD-10-CM | POA: Insufficient documentation

## 2020-07-01 DIAGNOSIS — R001 Bradycardia, unspecified: Secondary | ICD-10-CM | POA: Insufficient documentation

## 2020-07-01 LAB — CBC
HCT: 43.5 % (ref 36.0–46.0)
Hemoglobin: 14.2 g/dL (ref 12.0–15.0)
MCH: 28.5 pg (ref 26.0–34.0)
MCHC: 32.6 g/dL (ref 30.0–36.0)
MCV: 87.2 fL (ref 80.0–100.0)
Platelets: 180 10*3/uL (ref 150–400)
RBC: 4.99 MIL/uL (ref 3.87–5.11)
RDW: 12.6 % (ref 11.5–15.5)
WBC: 7.3 10*3/uL (ref 4.0–10.5)
nRBC: 0 % (ref 0.0–0.2)

## 2020-07-01 LAB — BASIC METABOLIC PANEL
Anion gap: 9 (ref 5–15)
BUN: 25 mg/dL — ABNORMAL HIGH (ref 8–23)
CO2: 27 mmol/L (ref 22–32)
Calcium: 9.3 mg/dL (ref 8.9–10.3)
Chloride: 99 mmol/L (ref 98–111)
Creatinine, Ser: 1.24 mg/dL — ABNORMAL HIGH (ref 0.44–1.00)
GFR, Estimated: 49 mL/min — ABNORMAL LOW (ref >60)
Glucose, Bld: 139 mg/dL — ABNORMAL HIGH (ref 70–99)
Potassium: 5 mmol/L (ref 3.5–5.1)
Sodium: 135 mmol/L (ref 135–145)

## 2020-07-01 LAB — CBG MONITORING, ED: Glucose-Capillary: 141 mg/dL — ABNORMAL HIGH (ref 70–99)

## 2020-07-01 MED ORDER — SODIUM CHLORIDE 0.9 % IV BOLUS
1000.0000 mL | Freq: Once | INTRAVENOUS | Status: AC
  Administered 2020-07-01: 1000 mL via INTRAVENOUS

## 2020-07-01 NOTE — Telephone Encounter (Signed)
Pt c/o BP issue: STAT if pt c/o blurred vision, one-sided weakness or slurred speech  1. What are your last 5 BP readings? 110/10 Hr 46  2. Are you having any other symptoms (ex. Dizziness, headache, blurred vision, passed out)? Dixxiness,lightheadness feels like she is about to pass out   3. What is your BP issue? PT is calling in with BP concerns.She is stating that her bp is so low she is really dizzy like she about to faint she is with her school nurse now.

## 2020-07-01 NOTE — ED Triage Notes (Signed)
Pt presents with generalized weakness, lightheadedness, low B/P today. Per pt she noted a BP at home as 73/46. Pt recently started Pt just started taking chlorthalidone and valsartan this past weekend.

## 2020-07-01 NOTE — ED Notes (Signed)
Adam - RN aware of pt's vitals.

## 2020-07-01 NOTE — Discharge Instructions (Addendum)
You were seen in the emergency department for low blood pressure and lightheadedness.  Your lab work showed you to be slightly dehydrated likely secondary to your new medications.  Please stop your chlorthalidone and your potassium for the next 2 days.  Check your blood pressure at home.  If your blood pressure stabilizes or starts going high you can take one half of your chlorthalidone.  Contact Dr. Elmarie Shiley office for close follow-up.  Return to the emergency department if any worsening or concerning symptoms

## 2020-07-01 NOTE — ED Provider Notes (Signed)
[facility] EMERGENCY DEPT Provider Note   CSN: [id number] Arrival date & time: 07/01/20  1106     History Chief Complaint  Patient presents with  . Weakness    Brittany Buckley is a 65 y.o. female.  Recently at her cardiologist office Dr Cathie Olden for elevated blood pressure and they stopped her lisinopril and added losartan and chlorthalidone.  She started these medications 2 days ago.  Today at school she felt weak and lightheaded and the school nurse checked her blood pressure and found it to be in the 80s.  Her heart rate slow also in the 40s.  She is usually hypertensive and her pulse is usually in the high 50s.  No recent vomiting or diarrhea.  She has had good fluid intake.  The history is provided by the patient.  Weakness Severity:  Moderate Onset quality:  Gradual Timing:  Intermittent Progression:  Unchanged Chronicity:  New Context: change in medication   Relieved by:  Nothing Worsened by:  Activity and standing Ineffective treatments:  Drinking fluids Associated symptoms: no abdominal pain, no chest pain, no cough, no diarrhea, no dysuria, no falls, no fever, no headaches, no loss of consciousness and no shortness of breath   Risk factors: coronary artery disease        Past Medical History:  Diagnosis Date  . Adult ADHD   . Anxiety   . Arthritis    "hands, feet" (06/14/2016)  . Asthma   . Atrial fibrillation ([place])   . CAD (coronary artery disease)   . Chronic lower back pain   . Chronic neck pain   . Coronary artery disease    a. history of MI and prior stenting in ~2014/2015 in [place] (stents in LCx and RCA based on stable cath in 06/2018).  . Diabetes mellitus without complication ([place])   . Gastric ulcer   . GERD (gastroesophageal reflux disease)   . Headache    "monthly" (06/14/2016)  . Heart murmur   . High cholesterol   . History of blood transfusion 1983   Bleed after C-Section  . Hyperlipidemia   . Hypertension   . MI  (myocardial infarction) ([place]) 2014; 2015  . Migraine    "probably twice/year" (06/14/2016)  . Myocardial infarction ([place])   . Numbness of toes   . Obesity   . On anticoagulant therapy   . Peripheral neuropathy   . Persistent atrial fibrillation ([place])   . Pneumonia 1990s  . Thyroid nodule   . Type II diabetes mellitus ([place])   . Urticaria     Patient Active Problem List   Diagnosis Date Noted  . Angina pectoris ([place]) 07/09/2018  . Colitis presumed to be due to infection 12/22/2016  . CAD in native artery 12/22/2016  . DM2 (diabetes mellitus, type 2) ([place]) 12/22/2016  . Acute hypotension 12/22/2016  . Atrial fibrillation with rapid ventricular response ([place]) 06/14/2016  . Murmur, cardiac 03/21/2016  . Hypercholesteremia 03/21/2016    Past Surgical History:  Procedure Laterality Date  . ABDOMINAL HYSTERECTOMY    . ADENOIDECTOMY    . BIOPSY THYROID    . BREAST CYST EXCISION Left 01/21/2019   Procedure: EXCISION OF LEFT BREAST MASS;  Surgeon: Jovita Kussmaul, MD;  Location: [facility];  Service: General;  Laterality: Left;  . BREAST EXCISIONAL BIOPSY Right   . CESAREAN SECTION    . CORONARY ANGIOPLASTY WITH STENT PLACEMENT  2014 & 2015  . CORONARY ANGIOPLASTY WITH STENT PLACEMENT    . LEFT HEART  CATH AND CORONARY ANGIOGRAPHY N/A 07/09/2018   Procedure: LEFT HEART CATH AND CORONARY ANGIOGRAPHY;  Surgeon: Belva Crome, MD;  Location: [facility];  Service: Cardiovascular;  Laterality: N/A;  . LEFT HEART CATH AND CORONARY ANGIOGRAPHY N/A 04/09/2020   Procedure: LEFT HEART CATH AND CORONARY ANGIOGRAPHY;  Surgeon: Belva Crome, MD;  Location: [facility];  Service: Cardiovascular;  Laterality: N/A;  . TONSILLECTOMY    . TONSILLECTOMY AND ADENOIDECTOMY    . TRIGGER FINGER RELEASE Right    "pointer"     OB History   No obstetric history on file.     Family History  Problem Relation Age of Onset  . Hyperlipidemia Mother   . Colon polyps Mother   . CAD Father   .  Heart attack Father   . Cancer - Ovarian Sister   . Diabetes Maternal Grandmother        blindness  . Heart disease Paternal Grandmother   . Diabetes Paternal Grandfather        with amputation  . Healthy Son   . Obesity Daughter     Social History   Tobacco Use  . Smoking status: Never Smoker  . Smokeless tobacco: Never Used  Vaping Use  . Vaping Use: Never used  Substance Use Topics  . Alcohol use: Yes    Comment: rarely  . Drug use: Not Currently    Home Medications Prior to Admission medications   Medication Sig Start Date End Date Taking? Authorizing Provider  acetaminophen (TYLENOL) 500 MG tablet Take 1,000-1,500 mg by mouth every 8 (eight) hours as needed for headache (pain).    [provider]  albuterol (VENTOLIN HFA) 108 (90 Base) MCG/ACT inhaler Inhale 2 puffs into the lungs every 6 (six) hours as needed for wheezing or shortness of breath.  12/02/19   [provider]  amLODipine (NORVASC) 5 MG tablet Take 1 tablet (5 mg total) by mouth daily. 06/19/20   Little, Wenda Overland, MD  atorvastatin (LIPITOR) 80 MG tablet Take 80 mg by mouth daily. 01/16/20   [provider]  buPROPion (WELLBUTRIN XL) 150 MG 24 hr tablet Take 150 mg by mouth daily. 01/16/20   [provider]  calcium carbonate (TUMS - DOSED IN MG ELEMENTAL CALCIUM) 500 MG chewable tablet Chew 1,000 mg by mouth daily as needed for indigestion or heartburn.    [provider]  carvedilol (COREG) 3.125 MG tablet Take 3.125 mg by mouth 2 (two) times daily.    [provider]  chlorthalidone (HYGROTON) 25 MG tablet Take 1 tablet (25 mg total) by mouth daily. 06/26/20   Nahser, Wonda Cheng, MD  diphenhydrAMINE (BENADRYL) 25 MG tablet Take 25 mg by mouth every 6 (six) hours as needed for itching or allergies.     [provider]  dronedarone (MULTAQ) 400 MG tablet Take 1 tablet (400 mg total) by mouth 2 (two) times daily with a meal. 02/01/19   Daune Perch,  NP  hydrocortisone cream 1 % Apply topically as needed for itching. Patient taking differently: Apply 1 application topically as needed for itching. 06/17/16   Baldwin Jamaica, PA-C  isosorbide mononitrate (IMDUR) 30 MG 24 hr tablet Take 30 mg by mouth daily. 01/16/20   [provider]  JARDIANCE 25 MG TABS tablet Take 25 mg by mouth daily. 03/05/18   [provider]  Melatonin 3 MG CAPS Take 3-6 mg by mouth at bedtime as needed (sleep).    [provider]  metFORMIN (GLUCOPHAGE) 1000 MG tablet Take 1,000 mg by mouth 2 (two) times daily. 01/16/20   [provider]  nitroGLYCERIN (NITROSTAT) 0.4 MG SL tablet Place 0.4 mg under the tongue every 5 (five) minutes as needed for chest pain.  09/13/19   [provider]  omeprazole (PRILOSEC) 40 MG capsule Take 40 mg by mouth daily as needed (acid reflux).  01/16/20   [provider]  potassium chloride (KLOR-CON) 10 MEQ tablet Take 1 tablet (10 mEq total) by mouth 2 (two) times daily. 06/26/20   Nahser, Wonda Cheng, MD  PRADAXA 150 MG CAPS capsule TAKE ONE CAPSULE BY MOUTH TWICE DAILY Patient taking differently: Take 150 mg by mouth 2 (two) times daily. 12/17/19   Nahser, Wonda Cheng, MD  valsartan (DIOVAN) 320 MG tablet Take 1 tablet (320 mg total) by mouth daily. 06/26/20   Nahser, Wonda Cheng, MD    Allergies    Codeine, Onion, Tomato, Chocolate, and Demerol [meperidine]  Review of Systems   Review of Systems  Constitutional: Negative for fever.  HENT: Negative for sore throat.   Eyes: Negative for visual disturbance.  Respiratory: Negative for cough and shortness of breath.   Cardiovascular: Negative for chest pain.  Gastrointestinal: Negative for abdominal pain and diarrhea.  Genitourinary: Negative for dysuria.  Musculoskeletal: Negative for falls and neck pain.  Skin: Negative for rash.  Neurological: Positive for weakness (generalized) and light-headedness. Negative for loss of consciousness and  headaches.    Physical Exam Updated Vital Signs BP (!) 100/53 (BP Location: Left Arm)   Pulse (!) 48   Temp 97.6 F (36.4 C) (Oral)   Resp 16   Ht 5\' 8"  (1.727 m)   Wt 93 kg   SpO2 97%   BMI 31.17 kg/m   Physical Exam Vitals and nursing note reviewed.  Constitutional:      General: She is not in acute distress.    Appearance: Normal appearance. She is well-developed.  HENT:     Head: Normocephalic and atraumatic.  Eyes:     Conjunctiva/sclera: Conjunctivae normal.  Cardiovascular:     Rate and Rhythm: Regular rhythm. Bradycardia present.     Heart sounds: No murmur heard.   Pulmonary:     Effort: Pulmonary effort is normal. No respiratory distress.     Breath sounds: Normal breath sounds.  Abdominal:     Palpations: Abdomen is soft.     Tenderness: There is no abdominal tenderness. There is no guarding or rebound.  Musculoskeletal:        General: Normal range of motion.     Cervical back: Neck supple.     Right lower leg: No edema.     Left lower leg: No edema.  Skin:    General: Skin is warm and dry.  Neurological:     General: No focal deficit present.     Mental Status: She is alert and oriented to person, place, and time.     Cranial Nerves: No cranial nerve deficit.     Sensory: No sensory deficit.     Motor: No weakness.     ED Results / Procedures / Treatments   Labs (all labs ordered are listed, but only abnormal results are displayed) Labs Reviewed  BASIC METABOLIC PANEL - Abnormal; Notable for the following components:      Result Value   Glucose, Bld 139 (*)    BUN 25 (*)    Creatinine, Ser 1.24 (*)    GFR,  Estimated 49 (*)    All other components within normal limits  CBG MONITORING, ED - Abnormal; Notable for the following components:   Glucose-Capillary 141 (*)    All other components within normal limits  CBC    EKG EKG Interpretation  Date/Time:  Wednesday Jul 01 2020 11:35:33 EDT Ventricular Rate:  46 PR Interval:  159 QRS  Duration: 90 QT Interval:  558 QTC Calculation: 489 R Axis:   77 Text Interpretation: Sinus bradycardia Borderline prolonged QT interval No significant change since prior 4/22 Confirmed by Aletta Edouard [phone]) on 07/01/2020 11:54:30 AM   Radiology No results found.  Procedures Procedures   Medications Ordered in ED Medications  sodium chloride 0.9 % bolus 1,000 mL (0 mLs Intravenous Stopped 07/01/20 1331)  sodium chloride 0.9 % bolus 1,000 mL (0 mLs Intravenous Stopped 07/01/20 1507)    ED Course  I have reviewed the triage vital signs and the nursing notes.  Pertinent labs & imaging results that were available during my care of the patient were reviewed by me and considered in my medical decision making (see chart for details).  Clinical Course as of 07/01/20 1709  Wed Jul 01, 2020  1303 Reassessed patient, heart rate remains in the high 40s and blood pressure is just over 100.  She is awake and alert.  IV fluids infusing [MB]  1319 With Dr. Tamala Julian, cardiology.  He is recommending stopping her chlorthalidone and potassium for the next few days and then starting back with a half a tablet of the chlorthalidone.  Do not resume potassium until lab recheck with cardiology. [MB]  1330 Blood pressure up to 114.  Patient states she feels a little more alert although still tired.  Will order her another liter of fluid. [MB]  2956 Patient's heart rate up to high 50s and blood pressure up to 120.  She is comfortable with plan for discharge. [MB]    Clinical Course User Index [MB] Hayden Rasmussen, MD   MDM Rules/Calculators/A&P                         This patient complains of lightheadedness and fatigue low blood pressure; this involves an extensive number of treatment Options and is a complaint that carries with it a high risk of complications and Morbidity. The differential includes dehydration, overmedication, metabolic derangement, arrhythmia, anemia  I ordered, reviewed and  interpreted labs, which included CBC with normal white count normal hemoglobin, chemistries with elevated BUN and creatinine and mild elevation of glucose likely reflecting some dehydration I ordered medication IV fluids with improvement in her symptoms and blood pressure Additional history obtained from patient's husband Previous records obtained and reviewed in epic including recent cardiology note in which her blood pressure medications were adjusted I consulted Dr. Tamala Julian [facility] and discussed lab and imaging findings  Critical Interventions: None  After the interventions stated above, I reevaluated the patient and found patient's blood pressure and heart rate to be improved.  She has also less symptomatic.  Reviewed recommendations from cardiology with her and I have also sent a secure letter via epic to patient's primary cardiologist regarding medication changes.  She understands to reach out to him for further recommendations.  Return instructions discussed   Final Clinical Impression(s) / ED Diagnoses Final diagnoses:  Hypotension due to drugs  Dehydration    Rx / DC Orders ED Discharge Orders    None       Melina Copa,  Rebeca Alert, MD 07/01/20 [phone]

## 2020-07-01 NOTE — Telephone Encounter (Signed)
Pt seen by Dr. Acie Fredrickson on 4/29 with multiple med changes related to HTN.  Pt calling in today because her BP got "too low" and she developed dizziness.  BP today was 110/60.  Dizziness has improved with just sitting and resting.  When it first began, she felt like she was going to pass out but didn't. Denies CP.  Currently with the school nurse who has pt drinking plenty of fluids.  Took the following meds this morning:  Amlodipine 5mg  Coreg 3.125mg  Chlorthalidone 25mg  Imdur 30mg  Valsartan 320mg   Advised I will send to Dr. Acie Fredrickson for review and advisement.

## 2020-07-01 NOTE — ED Notes (Signed)
Pt to restroom with standby assist. Pt with steady gait and no episodes of lightheadedness.

## 2020-07-01 NOTE — ED Notes (Signed)
Pt's CBG result was 141. Informed Adam - RN.

## 2020-07-01 NOTE — Telephone Encounter (Signed)
Agree with note by Dr. Tamala Julian to hold chlorthalidone and Kdur for 2 days and then restart chlorthalidine at 12. 5 mg a day  Continue to watch BP . We will DC completely if by remains low

## 2020-07-02 NOTE — Telephone Encounter (Signed)
Left message to call back  

## 2020-07-02 NOTE — Telephone Encounter (Signed)
Patient returned call regarding medication recommendations. Rn explained medication changes per Dr.Nahser and advised her to monitor blood pressure and notify us if she continues to experience issues. Patient verbalized understanding. 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 RN advised I would send her request to Dr. Acie Fredrickson. Patient states it is okay to send the letter through [place].

## 2020-07-14 ENCOUNTER — Other Ambulatory Visit: Payer: Self-pay

## 2020-07-14 ENCOUNTER — Telehealth (INDEPENDENT_AMBULATORY_CARE_PROVIDER_SITE_OTHER): Payer: Commercial Managed Care - PPO | Admitting: Pharmacist

## 2020-07-14 ENCOUNTER — Other Ambulatory Visit: Payer: Self-pay | Admitting: Cardiovascular Disease

## 2020-07-14 DIAGNOSIS — I10 Essential (primary) hypertension: Secondary | ICD-10-CM

## 2020-07-14 NOTE — Progress Notes (Signed)
Patient ID: Brittany Buckley                 DOB: Feb 25, 1956                      MRN: [id number]     HPI: Brittany Buckley is a 65 y.o. female referred by Dr. Acie Fredrickson to HTN clinic. PMH is significant for CAD, prior MI (prior stenting in ~2014/2015 in [place] (stents in LCx and RCA based on stable cath in 06/2018),ADHD, asthma, chronic neck/back pain, persistent AFib, HTN, HLD, gastric ulcer, migraines, obesity, DM. She was seen in the ED on 4/22 for hypertension and started on amlodipine 5mg  daily. On follow up with Dr. Acie Fredrickson her lisinopril was changed to Valsartan 320mg  and she was started on chlorthalidone 25mg  daily. She was subsequently seen in the ED on 5/4 with hypotension and given fluids. She was to hold her chlorthalidone for 2 days then resume at 12.5mg  daily. D/c KCL. Sr bumped from 0.87 to 1.24.   Today's appointment is done via telephone. Patient states she has not been taking all of her blood pressure medications because she is afraid of her blood pressure dropping too low. She has been taking carvedilol 3.125mg  twice a day and isosorbide 30mg  consistently. She took a dose of valsartan over the weekend, but hasn't been taking consistently. Not taking amlodipine or chlorthalidone. Her blood pressure has been in the 150's/70's which is average for her.   Current HTN meds: carvedilol 3.125mg  twice a day, isosorbide mononitrate 30mg  daily Previously tried: chlorthalidone 25mg  (bump in scr) BP goal: <130/80  Family History: The patient's family history includes CAD in her father; Cancer - Ovarian in her sister; Colon polyps in her mother; Diabetes in her maternal grandmother and paternal grandfather; Healthy in her son; Heart attack in her father; Heart disease in her paternal grandmother; Hyperlipidemia in her mother; Obesity in her daughter.  Social History: Not addressed this visit  Diet: Not addressed this visit  Exercise: Not addressed this visit  Home BP readings: 158/75 HR  61  Wt Readings from Last 3 Encounters:  07/01/20 205 lb (93 kg)  06/26/20 202 lb (91.6 kg)  06/19/20 198 lb 6.6 oz (90 kg)   BP Readings from Last 3 Encounters:  07/01/20 (!) 124/57  06/26/20 (!) 156/72  06/19/20 (!) 189/81   Pulse Readings from Last 3 Encounters:  07/01/20 (!) 56  06/26/20 (!) 58  06/19/20 66    Renal function: Estimated Creatinine Clearance: 54.6 mL/min (A) (by C-G formula based on SCr of 1.24 mg/dL (H)).  Past Medical History:  Diagnosis Date  . Adult ADHD   . Anxiety   . Arthritis    "hands, feet" (06/14/2016)  . Asthma   . Atrial fibrillation ([place])   . CAD (coronary artery disease)   . Chronic lower back pain   . Chronic neck pain   . Coronary artery disease    a. history of MI and prior stenting in ~2014/2015 in [place] (stents in LCx and RCA based on stable cath in 06/2018).  . Diabetes mellitus without complication ([place])   . Gastric ulcer   . GERD (gastroesophageal reflux disease)   . Headache    "monthly" (06/14/2016)  . Heart murmur   . High cholesterol   . History of blood transfusion 1983   Bleed after C-Section  . Hyperlipidemia   . Hypertension   . MI (myocardial infarction) ([place]) 2014; 2015  . Migraine    "  probably twice/year" (06/14/2016)  . Myocardial infarction ([place])   . Numbness of toes   . Obesity   . On anticoagulant therapy   . Peripheral neuropathy   . Persistent atrial fibrillation ([place])   . Pneumonia 1990s  . Thyroid nodule   . Type II diabetes mellitus ([place])   . Urticaria     Current Outpatient Medications on File Prior to Visit  Medication Sig Dispense Refill  . acetaminophen (TYLENOL) 500 MG tablet Take 1,000-1,500 mg by mouth every 8 (eight) hours as needed for headache (pain).    Marland Kitchen albuterol (VENTOLIN HFA) 108 (90 Base) MCG/ACT inhaler Inhale 2 puffs into the lungs every 6 (six) hours as needed for wheezing or shortness of breath.     Marland Kitchen amLODipine (NORVASC) 5 MG tablet Take 1 tablet (5 mg total) by mouth  daily. 30 tablet 0  . atorvastatin (LIPITOR) 80 MG tablet Take 80 mg by mouth daily.    Marland Kitchen buPROPion (WELLBUTRIN XL) 150 MG 24 hr tablet Take 150 mg by mouth daily.    . calcium carbonate (TUMS - DOSED IN MG ELEMENTAL CALCIUM) 500 MG chewable tablet Chew 1,000 mg by mouth daily as needed for indigestion or heartburn.    . carvedilol (COREG) 3.125 MG tablet Take 3.125 mg by mouth 2 (two) times daily.    . chlorthalidone (HYGROTON) 25 MG tablet Take 1 tablet (25 mg total) by mouth daily. 90 tablet 3  . diphenhydrAMINE (BENADRYL) 25 MG tablet Take 25 mg by mouth every 6 (six) hours as needed for itching or allergies.     Marland Kitchen dronedarone (MULTAQ) 400 MG tablet Take 1 tablet (400 mg total) by mouth 2 (two) times daily with a meal. 180 tablet 3  . hydrocortisone cream 1 % Apply topically as needed for itching. (Patient taking differently: Apply 1 application topically as needed for itching.) 30 g 0  . isosorbide mononitrate (IMDUR) 30 MG 24 hr tablet Take 30 mg by mouth daily.    Marland Kitchen JARDIANCE 25 MG TABS tablet Take 25 mg by mouth daily.    . Melatonin 3 MG CAPS Take 3-6 mg by mouth at bedtime as needed (sleep).    . metFORMIN (GLUCOPHAGE) 1000 MG tablet Take 1,000 mg by mouth 2 (two) times daily.    . nitroGLYCERIN (NITROSTAT) 0.4 MG SL tablet Place 0.4 mg under the tongue every 5 (five) minutes as needed for chest pain.     Marland Kitchen omeprazole (PRILOSEC) 40 MG capsule Take 40 mg by mouth daily as needed (acid reflux).     . potassium chloride (KLOR-CON) 10 MEQ tablet Take 1 tablet (10 mEq total) by mouth 2 (two) times daily. 180 tablet 3  . PRADAXA 150 MG CAPS capsule TAKE ONE CAPSULE BY MOUTH TWICE DAILY (Patient taking differently: Take 150 mg by mouth 2 (two) times daily.) 60 capsule 5  . valsartan (DIOVAN) 320 MG tablet Take 1 tablet (320 mg total) by mouth daily. 90 tablet 3   No current facility-administered medications on file prior to visit.    Allergies  Allergen Reactions  . Codeine Hives    ONLY  IN COUGH SYRUP  . Onion Other (See Comments)    Runny nose and chest/nasal congestion (IF RAW)  . Tomato Other (See Comments)    Runny nose and chest/nasal congestion (IF RAW)  . Chocolate Rash and Other (See Comments)    congestion  . Demerol [Meperidine] Rash    There were no vitals taken for this visit.  Assessment/Plan:  1. Hypertension - Blood pressure is above goal of <130/80. Will slowly resume some blood pressure medications as to avoid dropping blood pressure too low. I have asked patient to resume valsartan at 1/2 the dose 160mg  daily. If she feels well after several days, she can increase to 320mg  daily. Continue carvedilol 3.125mg  BID and isosorbide 30mg  daily. Will get lab work 6/3. Will follow up in clinic 6/9 (this was the first 4PM apt we had available). Patient to continue to check blood pressure at home.   Thank you  Ramond Dial, Pharm.D, BCPS, CPP [place]  [address] [place], [place] [address]  Phone: [phone]; Fax: [phone]

## 2020-07-20 ENCOUNTER — Ambulatory Visit: Payer: Commercial Managed Care - PPO

## 2020-07-21 ENCOUNTER — Ambulatory Visit: Payer: Commercial Managed Care - PPO

## 2020-07-31 ENCOUNTER — Other Ambulatory Visit: Payer: Commercial Managed Care - PPO

## 2020-07-31 ENCOUNTER — Other Ambulatory Visit (HOSPITAL_COMMUNITY): Payer: Commercial Managed Care - PPO

## 2020-08-03 ENCOUNTER — Encounter (HOSPITAL_COMMUNITY): Payer: Self-pay | Admitting: Cardiovascular Disease

## 2020-08-03 ENCOUNTER — Telehealth: Payer: Self-pay | Admitting: Physician Assistant

## 2020-08-03 DIAGNOSIS — I10 Essential (primary) hypertension: Secondary | ICD-10-CM

## 2020-08-03 NOTE — Telephone Encounter (Signed)
Pt has appt with the Hypertension Clinic 08/06/20 will forward her message to the Pharmacist for review.

## 2020-08-03 NOTE — Telephone Encounter (Signed)
Yes she needs to keep her appt for HTN follow up due to uncontrolled BP secondary to self-discontinuing her medications. Pt was supposed to have labs drawn on 6/3 before 6/9 office visit. She did not come for lab draw, therefore visit should remain in person since she can have labs drawn 6/9 instead.  Called pt, she states she was diagnosed with COVID today, and that she's going out of town in 2 weeks until August. Rescheduled lab check in 10 days on 6/16 and moved HTN appt to virtual visit since pt will be out of town at next available time on 6/23.

## 2020-08-03 NOTE — Telephone Encounter (Signed)
New message:    Patient calling to see if she need this apt coming up and if she do. She would like it to be VV please advise.

## 2020-08-06 ENCOUNTER — Ambulatory Visit: Payer: Commercial Managed Care - PPO

## 2020-08-13 ENCOUNTER — Other Ambulatory Visit: Payer: Self-pay | Admitting: Cardiovascular Disease

## 2020-08-13 ENCOUNTER — Other Ambulatory Visit: Payer: Commercial Managed Care - PPO

## 2020-08-17 ENCOUNTER — Telehealth (HOSPITAL_COMMUNITY): Payer: Self-pay | Admitting: Cardiovascular Disease

## 2020-08-17 NOTE — Telephone Encounter (Signed)
Just an FYI. We have made several attempts to contact this patient including sending a letter to schedule or reschedule their echocardiogram. We will be removing the patient from the echo WQ.   07/31/20 NO SHOWED-MAILED LETTER [facility]       Thank you

## 2020-08-20 ENCOUNTER — Telehealth (INDEPENDENT_AMBULATORY_CARE_PROVIDER_SITE_OTHER): Payer: Commercial Managed Care - PPO | Admitting: Pharmacist

## 2020-08-20 ENCOUNTER — Other Ambulatory Visit: Payer: Self-pay

## 2020-08-20 DIAGNOSIS — I10 Essential (primary) hypertension: Secondary | ICD-10-CM

## 2020-08-20 NOTE — Progress Notes (Signed)
Patient ID: Brittany Buckley                 DOB: 03/06/1955                      MRN: [id number]     HPI: Brittany Buckley is a 65 y.o. female referred by Dr. Acie Fredrickson to HTN clinic. PMH is significant for CAD, prior MI (prior stenting in ~2014/2015 in [place] (stents in LCx and RCA based on stable cath in 06/2018), ADHD, asthma, chronic neck/back pain, persistent AFib, HTN, HLD, gastric ulcer, migraines, obesity, DM. She was seen in the ED on 4/22 for hypertension and started on amlodipine 5mg  daily. On follow up with Dr. Acie Fredrickson her lisinopril was changed to Valsartan 320mg  and she was started on chlorthalidone 25mg  daily. She was subsequently seen in the ED on 5/4 with hypotension and given fluids. She was to hold her chlorthalidone for 2 days then resume at 12.5mg  daily, however she did not resume due to fear of her blood pressure dropping. D/c KCL. Sr bumped from 0.87 to 1.24.   At last visit, patient reported only taking carvedilol and isosorbide. Valsartan was resumed at 160mg  daily and she was encouraged to increase to 320mg  after several days if she felt ok. Home blood pressure was in the 150's. She was supposed to follow up in the beginning of June, but subsequently was diagnosed with COVID and appointment rescheduled. She was supposed to come in for lab work, but patient did not show to the appointment.  Today's appointment is done via telephone. Patient has been taking carvedilol 3.125mg  twice a day and valsartan 320mg . She resumed chlorthalidone 25mg  on her own about a few weeks ago. Her BP without it was 130-140's. Now BO is 110's-120's. She has not taken isosorbide mononitrate 30mg  in about a week. Forgot about it. She denies any dizziness or lightheadedness. 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  Sounds like when she had her hypotensive event she was taking valsartan and lisinopril.  Current HTN meds: carvedilol 3.125mg  twice a day, valsartan 320mg ,  chlorthalidone 25mg  daily, isosorbide mononitrate 30mg  daily  Previously tried: chlorthalidone 25mg  (bump in scr), amlodipine (pt stopped taking) BP goal: <130/80  Family History: The patient's family history includes CAD in her father; Cancer - Ovarian in her sister; Colon polyps in her mother; Diabetes in her maternal grandmother and paternal grandfather; Healthy in her son; Heart attack in her father; Heart disease in her paternal grandmother; Hyperlipidemia in her mother; Obesity in her daughter.  Social History: Not addressed this visit  Diet: Not addressed this visit  Exercise: Not addressed this visit  Home BP readings: 116/66, 125/59 HR 56  Wt Readings from Last 3 Encounters:  07/01/20 205 lb (93 kg)  06/26/20 202 lb (91.6 kg)  06/19/20 198 lb 6.6 oz (90 kg)   BP Readings from Last 3 Encounters:  07/01/20 (!) 124/57  06/26/20 (!) 156/72  06/19/20 (!) 189/81   Pulse Readings from Last 3 Encounters:  07/01/20 (!) 56  06/26/20 (!) 58  06/19/20 66    Renal function: CrCl cannot be calculated (Patient's most recent lab result is older than the maximum 21 days allowed.).  Past Medical History:  Diagnosis Date   Adult ADHD    Anxiety    Arthritis    "hands, feet" (06/14/2016)   Asthma    Atrial fibrillation (HCC)    CAD (coronary artery disease)  Chronic lower back pain    Chronic neck pain    Coronary artery disease    a. history of MI and prior stenting in ~2014/2015 in [place] (stents in LCx and RCA based on stable cath in 06/2018).   Diabetes mellitus without complication ([place])    Gastric ulcer    GERD (gastroesophageal reflux disease)    Headache    "monthly" (06/14/2016)   Heart murmur    High cholesterol    History of blood transfusion 1983   Bleed after C-Section   Hyperlipidemia    Hypertension    MI (myocardial infarction) ([place]) 2014; 2015   Migraine    "probably twice/year" (06/14/2016)   Myocardial infarction (HCC)    Numbness of toes     Obesity    On anticoagulant therapy    Peripheral neuropathy    Persistent atrial fibrillation (HCC)    Pneumonia 1990s   Thyroid nodule    Type II diabetes mellitus ([place])    Urticaria     Current Outpatient Medications on File Prior to Visit  Medication Sig Dispense Refill   acetaminophen (TYLENOL) 500 MG tablet Take 1,000-1,500 mg by mouth every 8 (eight) hours as needed for headache (pain).     albuterol (VENTOLIN HFA) 108 (90 Base) MCG/ACT inhaler Inhale 2 puffs into the lungs every 6 (six) hours as needed for wheezing or shortness of breath.      atorvastatin (LIPITOR) 80 MG tablet Take 80 mg by mouth daily.     buPROPion (WELLBUTRIN XL) 150 MG 24 hr tablet Take 150 mg by mouth daily.     calcium carbonate (TUMS - DOSED IN MG ELEMENTAL CALCIUM) 500 MG chewable tablet Chew 1,000 mg by mouth daily as needed for indigestion or heartburn.     carvedilol (COREG) 3.125 MG tablet Take 3.125 mg by mouth 2 (two) times daily.     diphenhydrAMINE (BENADRYL) 25 MG tablet Take 25 mg by mouth every 6 (six) hours as needed for itching or allergies.      dronedarone (MULTAQ) 400 MG tablet Take 1 tablet (400 mg total) by mouth 2 (two) times daily with a meal. 180 tablet 3   hydrocortisone cream 1 % Apply topically as needed for itching. (Patient taking differently: Apply 1 application topically as needed for itching.) 30 g 0   isosorbide mononitrate (IMDUR) 30 MG 24 hr tablet TAKE 1 TABLET(30 MG) BY MOUTH DAILY 90 tablet 3   JARDIANCE 25 MG TABS tablet Take 25 mg by mouth daily.     Melatonin 3 MG CAPS Take 3-6 mg by mouth at bedtime as needed (sleep).     metFORMIN (GLUCOPHAGE) 1000 MG tablet Take 1,000 mg by mouth 2 (two) times daily.     nitroGLYCERIN (NITROSTAT) 0.4 MG SL tablet Place 0.4 mg under the tongue every 5 (five) minutes as needed for chest pain.      omeprazole (PRILOSEC) 40 MG capsule Take 40 mg by mouth daily as needed (acid reflux).      potassium chloride (KLOR-CON) 10 MEQ tablet  Take 1 tablet (10 mEq total) by mouth 2 (two) times daily. 180 tablet 3   PRADAXA 150 MG CAPS capsule TAKE ONE CAPSULE BY MOUTH TWICE DAILY (Patient taking differently: Take 150 mg by mouth 2 (two) times daily.) 60 capsule 5   valsartan (DIOVAN) 320 MG tablet Take 1 tablet (320 mg total) by mouth daily. 90 tablet 3   No current facility-administered medications on file prior to visit.  Allergies  Allergen Reactions   Codeine Hives    ONLY IN COUGH SYRUP   Onion Other (See Comments)    Runny nose and chest/nasal congestion (IF RAW)   Tomato Other (See Comments)    Runny nose and chest/nasal congestion (IF RAW)   Chocolate Rash and Other (See Comments)    congestion   Demerol [Meperidine] Rash    There were no vitals taken for this visit.   Assessment/Plan:  1. Hypertension - Blood pressure is at goal at home. She needs to have a BMP checked since resuming valsartan and chlorthalidone. She will [facility] since she is out of town. States she will go tomorrow or Monday. I will follow up with patient after labs result. She will resume isosorbide. Continue carvedilol 3.125mg  twice a day, valsartan 320mg  and chlorthalidone 25mg  daily. She knows not to take lisinopril.   Thank you  Ramond Dial, Pharm.D, BCPS, CPP [place]  [address], [place], [place] [address]  Phone: [phone]; Fax: [phone]

## 2020-08-22 LAB — BASIC METABOLIC PANEL
BUN/Creatinine Ratio: 23 ratio (ref 10–24)
BUN: 23 mg/dL (ref 6–23)
CO2: 26 mmol/L (ref 22–29)
Calcium: 9.7 mg/dL (ref 8.4–10.4)
Chloride: 104 mmol/L (ref 98–107)
Creatinine, Ser: 1 mg/dL (ref 0.5–1.0)
Glucose: 186 mg/dL — ABNORMAL HIGH (ref 70–100)
Potassium: 4.3 mmol/L (ref 3.5–5.2)
Sodium: 143 mmol/L (ref 135–148)
eGFR: 66 mL/min/{1.73_m2} — ABNORMAL LOW (ref >=90)

## 2020-10-02 ENCOUNTER — Ambulatory Visit: Payer: Commercial Managed Care - PPO | Admitting: Physician Assistant

## 2020-10-02 ENCOUNTER — Telehealth: Payer: Self-pay | Admitting: Cardiovascular Disease

## 2020-10-02 MED ORDER — CARVEDILOL 3.125 MG PO TABS: 3.1250 mg | ORAL_TABLET | Freq: Two times a day (BID) | ORAL | 1 refills | Status: DC

## 2020-10-02 NOTE — Telephone Encounter (Signed)
Lm to call back ./cy 

## 2020-10-02 NOTE — Telephone Encounter (Signed)
*  STAT* If patient is at the pharmacy, call can be transferred to refill team.   1. Which medications need to be refilled? (please list name of each medication and dose if known) Carvedilol 6.25 MG  2. Which pharmacy/location (including street and city if local pharmacy) is medication to be sent to?[facility]  3. Do they need a 30 day or 90 day supply? Patient has appt on 01/01/21 needs some until then. SHE IS OUT OF MED.

## 2020-10-02 NOTE — Telephone Encounter (Signed)
Refill for Coreg has been sent to [facility], per pt request.

## 2020-12-11 ENCOUNTER — Other Ambulatory Visit: Payer: Self-pay | Admitting: Cardiovascular Disease

## 2021-01-01 ENCOUNTER — Ambulatory Visit: Payer: Commercial Managed Care - PPO | Admitting: Cardiovascular Disease

## 2021-01-28 ENCOUNTER — Ambulatory Visit (INDEPENDENT_AMBULATORY_CARE_PROVIDER_SITE_OTHER): Payer: Commercial Managed Care - PPO | Admitting: Physician Assistant

## 2021-01-28 ENCOUNTER — Encounter (HOSPITAL_BASED_OUTPATIENT_CLINIC_OR_DEPARTMENT_OTHER): Payer: Self-pay | Admitting: Physician Assistant

## 2021-01-28 ENCOUNTER — Other Ambulatory Visit: Payer: Self-pay

## 2021-01-28 VITALS — BP 182/84 | HR 51 | Ht 68.0 in | Wt 179.0 lb

## 2021-01-28 DIAGNOSIS — I10 Essential (primary) hypertension: Secondary | ICD-10-CM | POA: Diagnosis not present

## 2021-01-28 DIAGNOSIS — I480 Paroxysmal atrial fibrillation: Secondary | ICD-10-CM | POA: Diagnosis not present

## 2021-01-28 DIAGNOSIS — I2510 Atherosclerotic heart disease of native coronary artery without angina pectoris: Secondary | ICD-10-CM | POA: Diagnosis not present

## 2021-01-28 DIAGNOSIS — E785 Hyperlipidemia, unspecified: Secondary | ICD-10-CM | POA: Diagnosis not present

## 2021-01-28 DIAGNOSIS — E1159 Type 2 diabetes mellitus with other circulatory complications: Secondary | ICD-10-CM

## 2021-01-28 NOTE — Progress Notes (Signed)
Office Visit    Patient Name: Brittany Buckley Date of Encounter: 01/28/2021  PCP:  Ginger Organ., MD   [facility]  Cardiologist:  Mertie Moores, MD  Advanced Practice Provider:  No care team member to display Electrophysiologist:  Will Meredith Leeds, MD    Chief Complaint    Brittany Buckley is a 65 y.o. female with a hx of  CAD, prior MI (prior stenting in ~2014/2015 in [place] (stents in LCx and RCA based on stable cath in 06/2018), ADHD, asthma, chronic neck/back pain, persistent AFib, HTN, HLD, gastric ulcer, migraines, obesity, DM  presents today for 3 month follow-up for hypertension control.    Past Medical History    Past Medical History:  Diagnosis Date   Adult ADHD    Anxiety    Arthritis    "hands, feet" (06/14/2016)   Asthma    Atrial fibrillation (HCC)    CAD (coronary artery disease)    Chronic lower back pain    Chronic neck pain    Coronary artery disease    a. history of MI and prior stenting in ~2014/2015 in [place] (stents in LCx and RCA based on stable cath in 06/2018).   Diabetes mellitus without complication ([place])    Gastric ulcer    GERD (gastroesophageal reflux disease)    Headache    "monthly" (06/14/2016)   Heart murmur    High cholesterol    History of blood transfusion 1983   Bleed after C-Section   Hyperlipidemia    Hypertension    MI (myocardial infarction) ([place]) 2014; 2015   Migraine    "probably twice/year" (06/14/2016)   Myocardial infarction (HCC)    Numbness of toes    Obesity    On anticoagulant therapy    Peripheral neuropathy    Persistent atrial fibrillation (HCC)    Pneumonia 1990s   Thyroid nodule    Type II diabetes mellitus ([place])    Urticaria    Past Surgical History:  Procedure Laterality Date   ABDOMINAL HYSTERECTOMY     ADENOIDECTOMY     BIOPSY THYROID     BREAST CYST EXCISION Left 01/21/2019   Procedure: EXCISION OF LEFT BREAST MASS;  Surgeon: Jovita Kussmaul, MD;   Location: [facility];  Service: General;  Laterality: Left;   BREAST EXCISIONAL BIOPSY Right    CESAREAN SECTION     CORONARY ANGIOPLASTY WITH STENT PLACEMENT  2014 & 2015   CORONARY ANGIOPLASTY WITH STENT PLACEMENT     LEFT HEART CATH AND CORONARY ANGIOGRAPHY N/A 07/09/2018   Procedure: LEFT HEART CATH AND CORONARY ANGIOGRAPHY;  Surgeon: Belva Crome, MD;  Location: [facility];  Service: Cardiovascular;  Laterality: N/A;   LEFT HEART CATH AND CORONARY ANGIOGRAPHY N/A 04/09/2020   Procedure: LEFT HEART CATH AND CORONARY ANGIOGRAPHY;  Surgeon: Belva Crome, MD;  Location: [facility];  Service: Cardiovascular;  Laterality: N/A;   TONSILLECTOMY     TONSILLECTOMY AND ADENOIDECTOMY     TRIGGER FINGER RELEASE Right    "pointer"    Allergies  Allergies  Allergen Reactions   Codeine Hives    ONLY IN COUGH SYRUP   Onion Other (See Comments)    Runny nose and chest/nasal congestion (IF RAW)   Tomato Other (See Comments)    Runny nose and chest/nasal congestion (IF RAW)   Chocolate Rash and Other (See Comments)    congestion   Demerol [Meperidine] Rash    History of  Present Illness    Brittany Buckley is a 65 y.o. female with a hx of  CAD, prior MI (prior stenting in ~2014/2015 in [place] (stents in LCx and RCA based on stable cath in 06/2018), ADHD, asthma, chronic neck/back pain, persistent AFib, HTN, HLD, gastric ulcer, migraines, obesity, DM last seen 06/25/2020 by Dr. Acie Fredrickson.  She was seen back in 2018 by Dr. Acie Fredrickson for CAD. She had been under a lot of stress moving into a new home. She felt her heart racing and was seen in the ER April 6th 2021 for palpitations and was found to have Afib. She took Multaq and converted back into sinus rhythm. CHADS2VASC is 4. She was also on pradaxa.  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  Her last stress test was 4 years ago at that time which was abnormal and required another stent. She was  seen in June 2021 and was having some issues with bradycardia and hypotension. Her coreg was cut in half at this time. She did not have any further chest pain at this time. She walked her dog regularly. February 2022 she was seen in our office. She just broke her right ankle a few months before and she was still healing from that. She had some chest pain at that time while at rest and it radiated through her back. It was a pressure type sensation. She took  NTG with relief. It had been present intermittently for 3 months and did not feel her her MI pain. It had not worsened over that time. It does not seem to be exertional.   She was last seen April 2022 by Dr. Acie Fredrickson.  She had a cardiac catheterization in February which showed mild irregular calcification of the LAD, 50 to 60% stenosis at the OM of the circumflex, mild disease with patent RCA stent, and PDA of 50 to 60% stenosis with elevated LV EDP at 20 mmHg.  She was recently seen in the ED with marked elevated blood pressure.  She was started on amlodipine.  She started having headaches and she was told to avoid salt.  Her weight at this time was 202 pounds.  At that appointment chlorthalidone 25 mg a day with K-Dur 10 mEq twice daily was added to her regimen.  Lisinopril was discontinued.  Valsartan 320 mg daily was initiated.  An appointment was made for her in the hypertension clinic in 2 to 3 weeks.  She had a telemetry visit for the hypertension clinic in June 2022 at the time she was taking carvedilol 3.125 mg twice a day and valsartan 320 mg daily.  She resumed her chlorthalidone 25 mg on her own about a week prior to the appointment.  Her BP without it was 130s-140s.  Now BP is 539J to 673A systolic.  She has not taken her isosorbide mononitrate 30 mg for about a week because she forgot about it.  She denies any dizziness or lightheadedness at this time.  Her chlorthalidone was discontinued due to a bump in her creatinine.  She had stopped taking  her amlodipine.  At that visit it was determined that her blood pressure was at goal at home and that her cuff was accurate.  We scheduled her for a BMP check since she resumed valsartan and chlorthalidone.  Her most recent labs were done August 21, 2020 which showed her creatinine was 1.0, potassium 4.3, sodium 143.  This was improved from her last labs.  Today, she is worried about her BP being elevated today. She was late to her appointment and that has added to her stress today. Her initial BP was 218/114, on repeat after talking with her a while it decreased to 182/84. She states at home it usually runs 400-867Y systolic. She has not been able to get it lower than 140s. She takes her medications as prescribed and she has only missed her Imdur once. When she did she had some chest discomfort. She was able to take the medication 2 hours after her usual time. She had an episode where her BP dropped too low back in Aug/September of this year and she went to the ED. There, they gave her fluids and she was sent home. Some of her medications were decreased at that time but she doe not remember which ones. She does drink 4 cups of coffee daily but she states it "slows her down". It does not increase her HR and she is able to go to bed after drinking coffee. She has been on Multiq for her atrial fibrillation and this has kept things well controlled. She has not had any palpitations or fluttering in her chest. Her recent Lipid panel: HDL 54, LDL 71, and triglycerides 83. She was asked to continue her Lipitor which she is tolerating.   Reports no shortness of breath nor dyspnea on exertion.  No edema, orthopnea, PND. Reports no palpitations.     EKGs/Labs/Other Studies Reviewed:   The following studies were reviewed today:  Echocardiogram 07/16/2019 IMPRESSIONS     1. Left ventricular ejection fraction, by estimation, is 60 to 65%. The  left ventricle has normal function. The left ventricle has no regional   wall motion abnormalities. There is mild concentric left ventricular  hypertrophy. Left ventricular diastolic  parameters are consistent with Grade I diastolic dysfunction (impaired  relaxation).   2. Right ventricular systolic function is normal. The right ventricular  size is normal.   3. Left atrial size was mildly dilated.   4. The mitral valve is normal in structure. Trivial mitral valve  regurgitation. No evidence of mitral stenosis.   5. The aortic valve is normal in structure. Aortic valve regurgitation is  mild. No aortic stenosis is present.   6. The inferior vena cava is normal in size with greater than 50%  respiratory variability, suggesting right atrial pressure of 3 mmHg.   FINDINGS   Left Ventricle: Mild intracavitary gradient (peak velocity 0.78 m/s, peak  gradient 2.4 mmHg). Left ventricular ejection fraction, by estimation, is  60 to 65%. The left ventricle has normal function. The left ventricle has  no regional wall motion  abnormalities. The left ventricular internal cavity size was normal in  size. There is mild concentric left ventricular hypertrophy. Left  ventricular diastolic parameters are consistent with Grade I diastolic  dysfunction (impaired relaxation).   Right Ventricle: The right ventricular size is normal. No increase in  right ventricular wall thickness. Right ventricular systolic function is  normal.   Left Atrium: Left atrial size was mildly dilated.   Right Atrium: Right atrial size was normal in size.   Pericardium: There is no evidence of pericardial effusion.   Mitral Valve: The mitral valve is normal in structure. Normal mobility of  the mitral valve leaflets. Mild mitral annular calcification. Trivial  mitral valve regurgitation. No evidence of mitral valve stenosis.   Tricuspid Valve: The tricuspid valve is normal in structure. Tricuspid  valve regurgitation is trivial. No evidence of  tricuspid stenosis.   Aortic Valve: The  aortic valve is normal in structure.. There is mild  thickening and mild calcification of the aortic valve. Aortic valve  regurgitation is mild. No aortic stenosis is present. There is mild  thickening of the aortic valve. There is mild  calcification of the aortic valve.   Pulmonic Valve: The pulmonic valve was normal in structure. Pulmonic valve  regurgitation is not visualized. No evidence of pulmonic stenosis.   Aorta: The aortic root is normal in size and structure.   Venous: The inferior vena cava is normal in size with greater than 50%  respiratory variability, suggesting right atrial pressure of 3 mmHg.   IAS/Shunts: No atrial level shunt detected by color flow Doppler.   EKG:  EKG is  ordered today.  The ekg ordered today demonstrates sinus bradycardia, rate 51 bpm  Recent Labs: 07/01/2020: Hemoglobin 14.2; Platelets 180 08/21/2020: BUN 23; Creatinine, Ser 1.0; Potassium 4.3; Sodium 143  Recent Lipid Panel    Component Value Date/Time   CHOL 179 04/04/2016 1540   TRIG 180 (H) 04/04/2016 1540   HDL 47 04/04/2016 1540   CHOLHDL 3.8 04/04/2016 1540   LDLCALC 96 04/04/2016 1540    Risk Assessment/Calculations:   [id number] Score = 3  {Click here to calculate score.  REFRESH note before signing. : This indicates a 3.2% annual risk of stroke. The patient's score is based upon: CHF History: 0 HTN History: 1 Diabetes History: 1 Stroke History: 0 Vascular Disease History: 0 Age Score: 0 Gender Score: 1    Home Medications   Current Meds  Medication Sig   acetaminophen (TYLENOL) 500 MG tablet Take 1,000-1,500 mg by mouth every 8 (eight) hours as needed for headache (pain).   albuterol (VENTOLIN HFA) 108 (90 Base) MCG/ACT inhaler Inhale 2 puffs into the lungs every 6 (six) hours as needed for wheezing or shortness of breath.    atorvastatin (LIPITOR) 80 MG tablet TAKE 1 TABLET(80 MG) BY MOUTH DAILY   buPROPion (WELLBUTRIN XL) 150 MG 24 hr tablet Take 150 mg by mouth  daily.   calcium carbonate (TUMS - DOSED IN MG ELEMENTAL CALCIUM) 500 MG chewable tablet Chew 1,000 mg by mouth daily as needed for indigestion or heartburn.   carvedilol (COREG) 3.125 MG tablet Take 1 tablet (3.125 mg total) by mouth 2 (two) times daily.   diphenhydrAMINE (BENADRYL) 25 MG tablet Take 25 mg by mouth every 6 (six) hours as needed for itching or allergies.    dronedarone (MULTAQ) 400 MG tablet Take 1 tablet (400 mg total) by mouth 2 (two) times daily with a meal.   hydrocortisone cream 1 % Apply topically as needed for itching. (Patient taking differently: Apply 1 application topically as needed for itching.)   isosorbide mononitrate (IMDUR) 30 MG 24 hr tablet Take 30 mg by mouth daily.   JARDIANCE 25 MG TABS tablet Take 25 mg by mouth daily.   Melatonin 3 MG CAPS Take 3-6 mg by mouth at bedtime as needed (sleep).   metFORMIN (GLUCOPHAGE) 1000 MG tablet Take 1,000 mg by mouth 2 (two) times daily.   nitroGLYCERIN (NITROSTAT) 0.4 MG SL tablet Place 0.4 mg under the tongue every 5 (five) minutes as needed for chest pain.    omeprazole (PRILOSEC) 40 MG capsule Take 40 mg by mouth daily as needed (acid reflux).    potassium chloride (KLOR-CON) 10 MEQ tablet Take 1 tablet (10 mEq total) by mouth 2 (two) times daily.   PRADAXA 150 MG  CAPS capsule TAKE ONE CAPSULE BY MOUTH TWICE DAILY (Patient taking differently: Take 150 mg by mouth 2 (two) times daily.)   valsartan (DIOVAN) 320 MG tablet Take 1 tablet (320 mg total) by mouth daily.     Review of Systems      All other systems reviewed and are otherwise negative except as noted above.  Physical Exam    VS:  BP (!) 182/84   Pulse (!) 51   Ht 5\' 8"  (1.727 m)   Wt 179 lb (81.2 kg)   BMI 27.22 kg/m  , BMI Body mass index is 27.22 kg/m.   Wt Readings from Last 3 Encounters:  01/28/21 179 lb (81.2 kg)  07/01/20 205 lb (93 kg)  06/26/20 202 lb (91.6 kg)     GEN: Well nourished, well developed, in no acute distress. HEENT:  normal. Neck: Supple, no JVD, carotid bruits, or masses. Cardiac: RRR, no murmurs, rubs, or gallops. No clubbing, cyanosis, edema.  Radials/PT 2+ and equal bilaterally.  Respiratory:  Respirations regular and unlabored, clear to auscultation bilaterally. GI: Soft, nontender, nondistended. MS: No deformity or atrophy. Skin: Warm and dry, no rash. Neuro:  Strength and sensation are intact. Psych: Normal affect.  Assessment & Plan    CAD -no traditional angina -She has been taking her Imdur and she has not noticed any chest pain as long as she takes it -the discomfort is different feeling than her heart attack Continue GDMT: Atorvastatin, Coreg, Diovan, Pradaxa, Imdur, sublingual nitro as needed   PAF -She did not get a recent Echo so this will need to be scheduled today - No recent palpitations or fluttering in the chest -Continue Multaq, this is the only medication that works for her -Continue Pradaxa  Hypertension -She is not well controlled at home. She is getting readings between 140s-170s. She had never had a BP of over 528 systolic at home -In the clinic she was 218/114 initially. One repeat BP she was 182/84 -Continue to monitor your blood pressure at home and send me your daily readings through [facility] -She is to take an extra dose of Imdur tonight 30mg  since her BP was elevated in the clinic today -Continue low-sodium diet  Hyperlipidemia -Lipid panel done on 12/26/2020 which showed LDL 71, HDL 54, and Triglycerides 83 -Goal LDL < 70 due to CAD -Continue Lipitor 80mg  daily  5. Diabetes Mellitus Type 2 -most recent A1c  7.4  -No medication changes but weight loss -She has lost about 20 lbs -walks her dog and does some swimming  -follow-up with PCP   Disposition: Follow up in 2 months with Mertie Moores, MD or APP.  Signed, Elgie Collard, PA-C 01/28/2021, 5:26 PM [facility]

## 2021-01-28 NOTE — Patient Instructions (Addendum)
Medication Instructions:   As your blood pressure was high, recommend taking an extra dose (30mg ) of Isosorbide Mononitrate (Imdur) this evening.  If your blood pressure remains elevated we will consider adjusting your medications.   *If you need a refill on your cardiac medications before your next appointment, please call your pharmacy*  Lab Work: None ordered today.   Testing/Procedures: Your EKG today shows sinus bradycardia which is a stable heart rhythm.  We will get your echocardiogram scheduled. We will call you tomorrow to get this scheduled at our [place] location.   Follow-Up: At [facility], you and your health needs are our priority.  As part of our continuing mission to provide you with exceptional heart care, we have created designated Provider Care Teams.  These Care Teams include your primary Cardiologist (physician) and Advanced Practice Providers (APPs -  Physician Assistants and Nurse Practitioners) who all work together to provide you with the care you need, when you need it.  We recommend signing up for the patient portal called "MyChart".  Sign up information is provided on this After Visit Summary.  MyChart is used to connect with patients for Virtual Visits (Telemedicine).  Patients are able to view lab/test results, encounter notes, upcoming appointments, etc.  Non-urgent messages can be sent to your provider as well.   To learn more about what you can do with MyChart, go to [email or web address].    Your next appointment:   03/30/20 with Dr. Acie Fredrickson  Other Instructions  We will continue to work on getting records from [facility].   Tips to Measure your Blood Pressure Correctly  Please check once per day and keep a record. Send a MyChart message in one week regarding update.  To determine whether you have hypertension, a medical professional will take a blood pressure reading. How you prepare for the test, the position  of your arm, and other factors can change a blood pressure reading by 10% or more. That could be enough to hide high blood pressure, start you on a drug you don't really need, or lead your doctor to incorrectly adjust your medications.  National and international guidelines offer specific instructions for measuring blood pressure. If a doctor, nurse, or medical assistant isn't doing it right, don't hesitate to ask him or her to get with the guidelines.  Here's what you can do to ensure a correct reading:  Don't drink a caffeinated beverage or smoke during the 30 minutes before the test.  Sit quietly for five minutes before the test begins.  During the measurement, sit in a chair with your feet on the floor and your arm supported so your elbow is at about heart level.  The inflatable part of the cuff should completely cover at least 80% of your upper arm, and the cuff should be placed on bare skin, not over a shirt.  Don't talk during the measurement.  Have your blood pressure measured twice, with a brief break in between. If the readings are different by 5 points or more, have it done a third time.  In 2017, new guidelines from the [place], the [employer] of Cardiology, and nine other health organizations lowered the diagnosis of high blood pressure to 130/80 mm Hg or higher for all adults. The guidelines also redefined the various blood pressure categories to now include normal, elevated, Stage 1 hypertension, Stage 2 hypertension, and hypertensive crisis (see "Blood pressure categories").  Blood pressure categories  Blood pressure category SYSTOLIC (  upper number)  DIASTOLIC (lower number)  Normal Less than 120 mm Hg and Less than 80 mm Hg  Elevated 120-129 mm Hg and Less than 80 mm Hg  High blood pressure: Stage 1 hypertension 130-139 mm Hg or 80-89 mm Hg  High blood pressure: Stage 2 hypertension 140 mm Hg or higher or 90 mm Hg or higher  Hypertensive crisis (consult  your doctor immediately) Higher than 180 mm Hg and/or Higher than 120 mm Hg  Source: American Heart Association and American Stroke Association. For more on getting your blood pressure under control, buy Controlling Your Blood Pressure, a Special Health Report from [facility].   Blood Pressure Log   Date   Time  Blood Pressure  Position  Example: Nov 1 9 AM 124/78 sitting

## 2021-02-02 ENCOUNTER — Telehealth (HOSPITAL_BASED_OUTPATIENT_CLINIC_OR_DEPARTMENT_OTHER): Payer: Self-pay | Admitting: Family

## 2021-02-02 ENCOUNTER — Telehealth (HOSPITAL_BASED_OUTPATIENT_CLINIC_OR_DEPARTMENT_OTHER): Payer: Self-pay | Admitting: Physician Assistant

## 2021-02-02 NOTE — Telephone Encounter (Signed)
Received records from [facility] from patient ED visit 01/22/21.  Scanned into chart for permanent record.  Pertinent details below.  Loel Dubonnet, NP  ____________________________ EKG 01/22/21 Independently reviewed shows NSR 78 bpm with no acute ST/T wave changes.   ED provider note summary Patient presented to the ED due to concerns for stroke by daughter.  She left-sided neck pain, chest pain, elevated blood pressure greater than 180s.  She did have an edible the night before.  Physical exam unremarkable.  EKG nonacute.  Left-sided pain thought to be musculoskeletal.  She was provided with IV fluids.  CT brain with no acute abnormality.  Troponin negative x 2.  CT head 01/22/2021 No evidence of acute intracranial abnormality  CXR 01/22/21 Prominent pulmonary vasculature without overt pulmonary edema

## 2021-02-02 NOTE — Telephone Encounter (Signed)
Left message for patient to call and schedule the ordered Echo.

## 2021-02-04 ENCOUNTER — Encounter (HOSPITAL_BASED_OUTPATIENT_CLINIC_OR_DEPARTMENT_OTHER): Payer: Self-pay

## 2021-02-24 ENCOUNTER — Other Ambulatory Visit (HOSPITAL_BASED_OUTPATIENT_CLINIC_OR_DEPARTMENT_OTHER): Payer: Commercial Managed Care - PPO

## 2021-02-25 ENCOUNTER — Other Ambulatory Visit (HOSPITAL_BASED_OUTPATIENT_CLINIC_OR_DEPARTMENT_OTHER): Payer: Commercial Managed Care - PPO

## 2021-03-14 ENCOUNTER — Encounter: Payer: Self-pay | Admitting: *Deleted

## 2021-03-14 DIAGNOSIS — R739 Hyperglycemia, unspecified: Secondary | ICD-10-CM

## 2021-03-15 ENCOUNTER — Ambulatory Visit (INDEPENDENT_AMBULATORY_CARE_PROVIDER_SITE_OTHER): Payer: Commercial Managed Care - PPO

## 2021-03-15 ENCOUNTER — Other Ambulatory Visit: Payer: Self-pay

## 2021-03-15 DIAGNOSIS — I480 Paroxysmal atrial fibrillation: Secondary | ICD-10-CM

## 2021-03-15 LAB — ECHOCARDIOGRAM COMPLETE
AR max vel: 2.11 cm2
AV Area VTI: 1.85 cm2
AV Area mean vel: 1.78 cm2
AV Mean grad: 9 mmHg
AV Peak grad: 16.3 mmHg
Ao pk vel: 2.02 m/s
Area-P 1/2: 2.47 cm2
Calc EF: 71.4 %
S' Lateral: 2.42 cm
Single Plane A2C EF: 70.9 %
Single Plane A4C EF: 74 %

## 2021-03-17 ENCOUNTER — Telehealth (HOSPITAL_BASED_OUTPATIENT_CLINIC_OR_DEPARTMENT_OTHER): Payer: Self-pay

## 2021-03-17 LAB — GLUCOSE, POCT (MANUAL RESULT ENTRY): POC Glucose: 108 mg/dl — AB (ref 70–99)

## 2021-03-17 NOTE — [facility] Nurse Program (Signed)
°  Dept: [facility] Nurse Program Note  Date of Encounter: 03/14/2021  Past Medical History: Past Medical History:  Diagnosis Date   Adult ADHD    Anxiety    Arthritis    "hands, feet" (06/14/2016)   Asthma    Atrial fibrillation (HCC)    CAD (coronary artery disease)    Chronic lower back pain    Chronic neck pain    Coronary artery disease    a. history of MI and prior stenting in ~2014/2015 in [place] (stents in LCx and RCA based on stable cath in 06/2018).   Diabetes mellitus without complication ([place])    Gastric ulcer    GERD (gastroesophageal reflux disease)    Headache    "monthly" (06/14/2016)   Heart murmur    High cholesterol    History of blood transfusion 1983   Bleed after C-Section   Hyperlipidemia    Hypertension    MI (myocardial infarction) ([place]) 2014; 2015   Migraine    "probably twice/year" (06/14/2016)   Myocardial infarction (HCC)    Numbness of toes    Obesity    On anticoagulant therapy    Peripheral neuropathy    Persistent atrial fibrillation ([place])    Pneumonia 1990s   Thyroid nodule    Type II diabetes mellitus ([place])    Urticaria     Encounter Details:  CNP Questionnaire - 03/14/21 1300       Questionnaire   Do you give verbal consent to treat you today? Yes    Location Patient [facility] or Organization    Patient Status Unknown    Sport and exercise psychologist or [employer] Referral N/A    Medication Have Medication Insecurities    Medical Provider Yes    Screening Referrals N/A    Medical Referral N/A    Medical Appointment Made N/A    Food N/A    Transportation N/A    Housing/Utilities N/A    Interpersonal Safety N/A    Intervention Blood pressure;Blood glucose;Support;Educate    ED Visit Averted Yes    Life-Saving Intervention Made N/A            Client came into [facility] service and asked for BP and blood glucose check.  BP 168/80 HR 48 and CBG  108.  Client to follow up with this CN or with PCP as desired.  Karene Fry, RN, MSN, [place] [facility] [phone] Cell

## 2021-03-17 NOTE — Telephone Encounter (Addendum)
Called patient with results. Patient is very happy with her results!    ----- Message from Elgie Collard, PA-C sent at 03/16/2021  7:55 AM EST ----- Please share with the patient:   Brittany Buckley,   Your heart function is normal. Aortic thickening present without narrowing. All the other valves are normal. If you have any other questions please let me know!  Take Care,   Brittany Rough, PA-C

## 2021-03-25 ENCOUNTER — Encounter: Payer: Self-pay | Admitting: Cardiovascular Disease

## 2021-03-30 ENCOUNTER — Other Ambulatory Visit: Payer: Self-pay

## 2021-03-30 ENCOUNTER — Encounter: Payer: Self-pay | Admitting: Cardiovascular Disease

## 2021-03-30 ENCOUNTER — Ambulatory Visit (INDEPENDENT_AMBULATORY_CARE_PROVIDER_SITE_OTHER): Payer: Commercial Managed Care - PPO | Admitting: Cardiovascular Disease

## 2021-03-30 VITALS — BP 135/80 | HR 56 | Ht 68.0 in | Wt 183.0 lb

## 2021-03-30 DIAGNOSIS — I10 Essential (primary) hypertension: Secondary | ICD-10-CM | POA: Diagnosis not present

## 2021-03-30 NOTE — Patient Instructions (Signed)
Medication Instructions:  Your physician recommends that you continue on your current medications as directed. Please refer to the Current Medication list given to you today.  *If you need a refill on your cardiac medications before your next appointment, please call your pharmacy*   Lab Work: None ordered  If you have labs (blood work) drawn today and your tests are completely normal, you will receive your results only by: [place] (if you have MyChart) OR A paper copy in the mail If you have any lab test that is abnormal or we need to change your treatment, we will call you to review the results.   Testing/Procedures: None ordered   Follow-Up: At [facility], you and your health needs are our priority.  As part of our continuing mission to provide you with exceptional heart care, we have created designated Provider Care Teams.  These Care Teams include your primary Cardiologist (physician) and Advanced Practice Providers (APPs -  Physician Assistants and Nurse Practitioners) who all work together to provide you with the care you need, when you need it.  We recommend signing up for the patient portal called "MyChart".  Sign up information is provided on this After Visit Summary.  MyChart is used to connect with patients for Virtual Visits (Telemedicine).  Patients are able to view lab/test results, encounter notes, upcoming appointments, etc.  Non-urgent messages can be sent to your provider as well.   To learn more about what you can do with MyChart, go to [email or web address].    Your next appointment:   6 month(s)  The format for your next appointment:   In Person  Provider:   Robbie Lis, PA-C, Christen Bame, NP, or Richardson Dopp, PA-C         Other Instructions

## 2021-03-30 NOTE — Progress Notes (Signed)
Cardiology Office Note:    Date:  03/30/2021   ID:  Brittany Buckley, DOB 05-24-1955, MRN [id number]  PCP:  Ginger Organ., MD  Cardiologist:  Mertie Moores, MD  Electrophysiologist:  Constance Haw, MD   Referring MD: Ginger Organ., MD   Chief Complaint  Patient presents with   Coronary Artery Disease        Heart Murmur      History of Present Illness:    Brittany Buckley is a 66 y.o. female with a hx of  CAD, prior MI (prior stenting in ~2014/2015 in [place] (stents in LCx and RCA based on stable cath in 06/2018), ADHD, asthma, chronic neck/back pain, persistent AFib, HTN, HLD, gastric ulcer, migraines, obesity, DM  She was last seen by me in 2018 for CAD   .  Has been under lots of stress moving into a new home.   Felt her heart racing .   She was seen in the ER on June 04, 2019 for palpitations and was found to have Afib. Took Multaq early  She spontaneously converted back to sinus rhythm.  CHADS2VASC is 4.  She is on pradaxa.  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 She cut her Coreg to 3.125 a day .  No further episodes of CP .  ,  No angina  Is not exercising,  Walks her dog regularly  Is scheduled to see Dr. Brigitte Pulse this summer.   Feb. 4, 2022: Brittany Buckley is seen for follow up of her CAD, Atrial fib, HTN She broke her right ankle in Nov.    Still healing up from that  Has some chest pains - usually at night while at rest , radiates through to her back , pressure like sensation  Takes NTG with relief.  Not all that similar to her MI pain  Has been present intermittantly for 3 months ,  Has not worsened significantly  Does not seem to be exertional    June 26, 2020 Brittany Buckley is seen for follow up of her recent hospitalization  Hx of atrial fib, CAD Cath in Feb showed : LAD - mild irreg LCx - 50-60% stenosis at OM RCA, mild disease , patent RCA stent,   PDA has a 50-60% stenosis  Elevated LVEDP at 20 mmHg  She was recently seen in the ER with markedly elevated BP   Was recently found to have markedly elevated BP Was started on amlodipine. Started having headaches   Avoid salt   Wt is 202 lbs.   Jan. 31, 2023: Brittany Buckley is seen today . Wt is 183 lbs - down 19 lbs from previous visit   Had an episode of angina this past Saturday . While watching TV  Angina lasted about 15-20- min Took a SL NTG - the pain was relieved about 5-10 min later.  Has scheduled an appt with ENT Was found to have a nodule in her neck  More enlarged on the left side  Is on Multaq - is requesting samples or enroling in a patience program   Had a presyncopal episode 8 months ago.   BP was low for her but  was still in the normal range  ( 109/55)   BP recently has been in the 140-150 range.  Has been eating popcorn recently   She has mild AS   Past Medical History:  Diagnosis Date   Adult ADHD    Anxiety    Arthritis    "hands, feet" (06/14/2016)   Asthma    Atrial fibrillation (HCC)    CAD (coronary artery disease)    Chronic lower back pain    Chronic neck pain    Coronary artery disease    a. history of MI and prior stenting in ~2014/2015 in [place] (stents in LCx and RCA based on stable cath in 06/2018).   Diabetes mellitus without complication ([place])    Gastric ulcer    GERD (gastroesophageal reflux disease)    Headache    "monthly" (06/14/2016)   Heart murmur    High cholesterol    History of blood transfusion 1983   Bleed after C-Section   Hyperlipidemia    Hypertension    MI (myocardial infarction) ([place]) 2014; 2015   Migraine    "probably twice/year" (06/14/2016)   Myocardial infarction (HCC)    Numbness of toes     Obesity    On anticoagulant therapy    Peripheral neuropathy    Persistent atrial fibrillation (HCC)    Pneumonia 1990s   Thyroid nodule    Type II diabetes mellitus ([place])    Urticaria     Past Surgical History:  Procedure Laterality Date   ABDOMINAL HYSTERECTOMY     ADENOIDECTOMY     BIOPSY THYROID     BREAST CYST EXCISION Left 01/21/2019   Procedure: EXCISION OF LEFT BREAST MASS;  Surgeon: Jovita Kussmaul, MD;  Location: [facility];  Service: General;  Laterality: Left;   BREAST EXCISIONAL BIOPSY Right    CESAREAN SECTION     CORONARY ANGIOPLASTY WITH STENT PLACEMENT  2014 & 2015   CORONARY ANGIOPLASTY WITH STENT PLACEMENT     LEFT HEART CATH AND CORONARY ANGIOGRAPHY N/A 07/09/2018   Procedure: LEFT HEART CATH AND CORONARY ANGIOGRAPHY;  Surgeon: Belva Crome, MD;  Location: [facility];  Service: Cardiovascular;  Laterality: N/A;   LEFT HEART CATH AND CORONARY ANGIOGRAPHY N/A 04/09/2020   Procedure: LEFT HEART CATH AND CORONARY ANGIOGRAPHY;  Surgeon: Belva Crome, MD;  Location: [facility];  Service: Cardiovascular;  Laterality: N/A;   TONSILLECTOMY     TONSILLECTOMY AND ADENOIDECTOMY     TRIGGER FINGER RELEASE Right    "pointer"    Current Medications: Current Meds  Medication Sig   acetaminophen (TYLENOL) 500 MG tablet Take 1,000-1,500 mg by mouth every 8 (eight) hours as needed for headache (pain).   albuterol (VENTOLIN HFA) 108 (90 Base) MCG/ACT inhaler Inhale 2 puffs into the lungs every 6 (six) hours as needed for wheezing or shortness of breath.    atorvastatin (LIPITOR) 80 MG tablet TAKE 1 TABLET(80 MG) BY MOUTH DAILY   buPROPion (WELLBUTRIN XL) 150 MG 24 hr tablet Take 150 mg by mouth daily.   calcium carbonate (TUMS - DOSED IN MG ELEMENTAL CALCIUM) 500 MG chewable tablet Chew 1,000 mg by mouth daily as needed for indigestion or heartburn.   carvedilol (COREG) 3.125 MG tablet Take 1 tablet (3.125 mg total) by mouth 2 (two) times daily.   diphenhydrAMINE  (BENADRYL) 25 MG tablet Take 25 mg by mouth every 6 (six) hours as needed for itching or allergies.    dronedarone (MULTAQ) 400  MG tablet Take 1 tablet (400 mg total) by mouth 2 (two) times daily with a meal.   hydrocortisone cream 1 % Apply topically as needed for itching.   isosorbide mononitrate (IMDUR) 30 MG 24 hr tablet Take 30 mg by mouth daily.   JARDIANCE 25 MG TABS tablet Take 25 mg by mouth daily.   metFORMIN (GLUCOPHAGE) 1000 MG tablet Take 1,000 mg by mouth 2 (two) times daily.   nitroGLYCERIN (NITROSTAT) 0.4 MG SL tablet Place 0.4 mg under the tongue every 5 (five) minutes as needed for chest pain.    omeprazole (PRILOSEC) 40 MG capsule Take 40 mg by mouth daily as needed (acid reflux).    potassium chloride (KLOR-CON) 10 MEQ tablet Take 1 tablet (10 mEq total) by mouth 2 (two) times daily.   PRADAXA 150 MG CAPS capsule TAKE ONE CAPSULE BY MOUTH TWICE DAILY   valsartan (DIOVAN) 320 MG tablet Take 1 tablet (320 mg total) by mouth daily.     Allergies:   Codeine, Onion, Tikosyn [dofetilide], Tomato, Chocolate, and Demerol [meperidine]   Social History   Socioeconomic History   Marital status: Married    Spouse name: Not on file   Number of children: Not on file   Years of education: Not on file   Highest education level: Not on file  Occupational History   Not on file  Tobacco Use   Smoking status: Never   Smokeless tobacco: Never  Vaping Use   Vaping Use: Never used  Substance and Sexual Activity   Alcohol use: Yes    Comment: rarely   Drug use: Not Currently   Sexual activity: Yes    Comment: married  Other Topics Concern   Not on file  Social History Narrative   ** Merged History Encounter **       Social Determinants of Health   Financial Resource Strain: Not on file  Food Insecurity: Not on file  Transportation Needs: Not on file  Physical Activity: Not on file  Stress: Not on file  Social Connections: Not on file     Family History: The patient's  family history includes CAD in her father; Cancer - Ovarian in her sister; Colon polyps in her mother; Diabetes in her maternal grandmother and paternal grandfather; Healthy in her son; Heart attack in her father; Heart disease in her paternal grandmother; Hyperlipidemia in her mother; Obesity in her daughter.  ROS:   Please see the history of present illness.     All other systems reviewed and are negative.  EKGs/Labs/Other Studies Reviewed:       Recent Labs: 07/01/2020: Hemoglobin 14.2; Platelets 180 08/21/2020: BUN 23; Creatinine, Ser 1.0; Potassium 4.3; Sodium 143  Recent Lipid Panel    Component Value Date/Time   CHOL 179 04/04/2016 1540   TRIG 180 (H) 04/04/2016 1540   HDL 47 04/04/2016 1540   CHOLHDL 3.8 04/04/2016 1540   LDLCALC 96 04/04/2016 1540    Physical Exam:     Physical Exam: Blood pressure 135/80, pulse (!) 56, height 5\' 8"  (1.727 m), weight 183 lb (83 kg), SpO2 99 %.  GEN:  Well nourished, well developed in no acute distress HEENT: Normal NECK: No JVD; No carotid bruits LYMPHATICS: No lymphadenopathy CARDIAC: RRR  2/6 systolic murmur  RESPIRATORY:  Clear to auscultation without rales, wheezing or rhonchi  ABDOMEN: Soft, non-tender, non-distended MUSCULOSKELETAL:  No edema; No deformity  SKIN: Warm and dry NEUROLOGIC:  Alert and oriented x 3    EKG:  ASSESSMENT:    No diagnosis found.  PLAN:      1.  Coronary artery disease:   no angina  Hx of 2 stents, while in [place]   She had a brief episode of CP this past week.  It did not last for long.  It may or may not of been angina. She does not feel like it was severe enough to warrant any additional study and it did not feel exactly like her episodes of angina.  We will continue to keep an eye on this.  She will let us know if she has additional episodes that need attention.   2   PAF:  cont Multiq    she has been tried on Tikosyn but it caused QT prolongation.  She has coronary artery  disease so flecainide was not an option.  She was thought to be too young to start amiodarone.  She was tried on Multaq which seems to be maintaining sinus rhythm.   3.  Hyperlipidemia :   managed by Dr. Brigitte Pulse.     4.  HTN:  BP is better after waiting a few minutes.     Medication Adjustments/Labs and Tests Ordered: Current medicines are reviewed at length with the patient today.  Concerns regarding medicines are outlined above.  No orders of the defined types were placed in this encounter.  No orders of the defined types were placed in this encounter.   Patient Instructions  Medication Instructions:  Your physician recommends that you continue on your current medications as directed. Please refer to the Current Medication list given to you today.  *If you need a refill on your cardiac medications before your next appointment, please call your pharmacy*   Lab Work: None ordered  If you have labs (blood work) drawn today and your tests are completely normal, you will receive your results only by: [place] (if you have MyChart) OR A paper copy in the mail If you have any lab test that is abnormal or we need to change your treatment, we will call you to review the results.   Testing/Procedures: None ordered   Follow-Up: At [facility], you and your health needs are our priority.  As part of our continuing mission to provide you with exceptional heart care, we have created designated Provider Care Teams.  These Care Teams include your primary Cardiologist (physician) and Advanced Practice Providers (APPs -  Physician Assistants and Nurse Practitioners) who all work together to provide you with the care you need, when you need it.  We recommend signing up for the patient portal called "MyChart".  Sign up information is provided on this After Visit Summary.  MyChart is used to connect with patients for Virtual Visits (Telemedicine).  Patients are able to view lab/test  results, encounter notes, upcoming appointments, etc.  Non-urgent messages can be sent to your provider as well.   To learn more about what you can do with MyChart, go to [email or web address].    Your next appointment:   6 month(s)  The format for your next appointment:   In Person  Provider:   Robbie Lis, PA-C, Christen Bame, NP, or Richardson Dopp, PA-C         Other Instructions     Signed, Mertie Moores, MD  03/30/2021 5:11 PM    [place]

## 2021-04-27 ENCOUNTER — Other Ambulatory Visit: Payer: Self-pay | Admitting: *Deleted

## 2021-04-27 MED ORDER — CARVEDILOL 3.125 MG PO TABS: 3.1250 mg | ORAL_TABLET | Freq: Two times a day (BID) | ORAL | 1 refills | Status: AC

## 2021-05-14 ENCOUNTER — Other Ambulatory Visit: Payer: Self-pay | Admitting: Cardiovascular Disease

## 2021-05-14 ENCOUNTER — Other Ambulatory Visit: Payer: Self-pay

## 2021-05-14 MED ORDER — ATORVASTATIN CALCIUM 80 MG PO TABS: ORAL_TABLET | ORAL | 3 refills | Status: AC

## 2021-05-19 ENCOUNTER — Other Ambulatory Visit: Payer: Self-pay | Admitting: Otolaryngology

## 2021-05-19 DIAGNOSIS — E079 Disorder of thyroid, unspecified: Secondary | ICD-10-CM

## 2021-07-02 ENCOUNTER — Other Ambulatory Visit: Payer: Self-pay | Admitting: *Deleted

## 2021-07-02 MED ORDER — VALSARTAN 320 MG PO TABS: 320.0000 mg | ORAL_TABLET | Freq: Every day | ORAL | 1 refills | Status: AC

## 2021-07-20 IMAGING — US US BREAST*L* LIMITED INC AXILLA
1 series · 12 of 12 positions shown · non-contrast
Comparison: Screening mammogram dated 11/15/2018.
COMPARISON: Screening mammogram dated 11/15/2018.

Addendum:
CLINICAL DATA: Patient was called back from screening mammogram for
a left breast mass.

EXAM:
ULTRASOUND OF THE LEFT BREAST

[Series 1: us breast*left* limited inc axilla · 0.05mm/px · 12 of 12 slices shown]
[im 1/12]
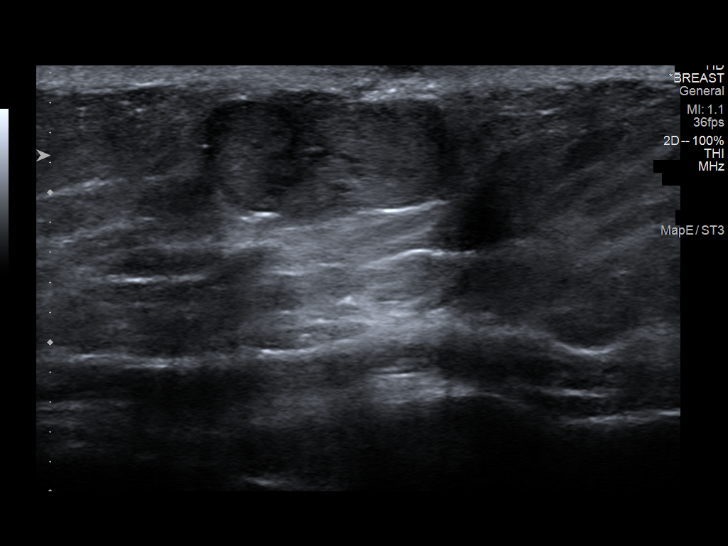
[im 2/12]
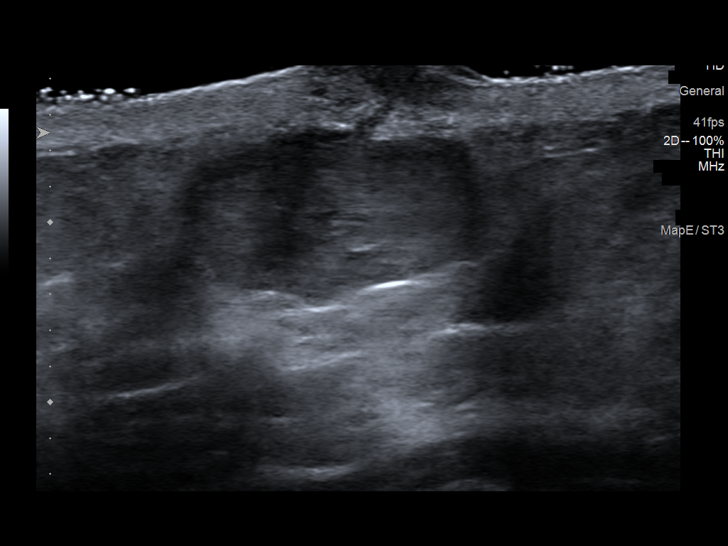
[im 3/12]
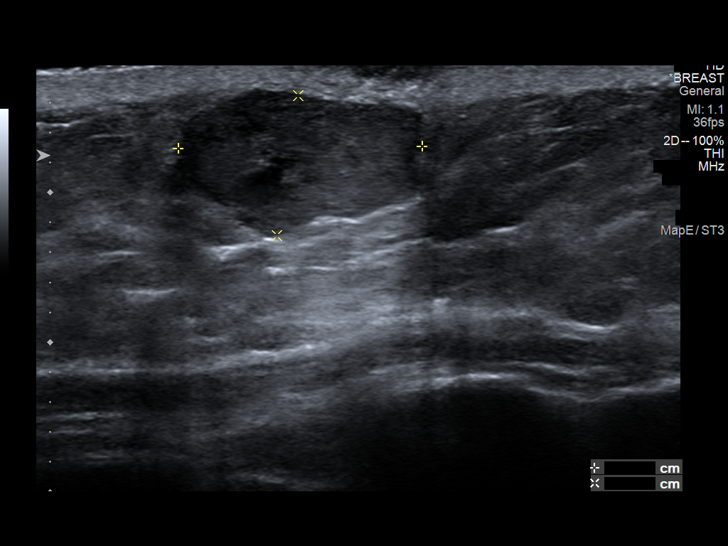
[im 4/12]
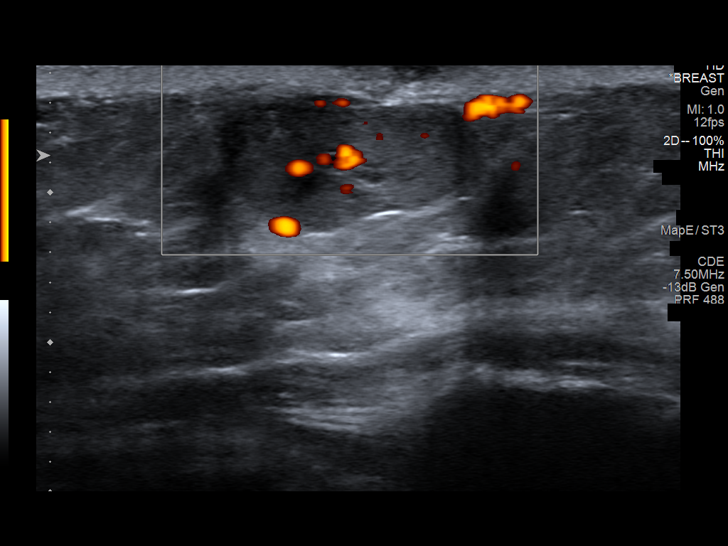
[im 5/12]
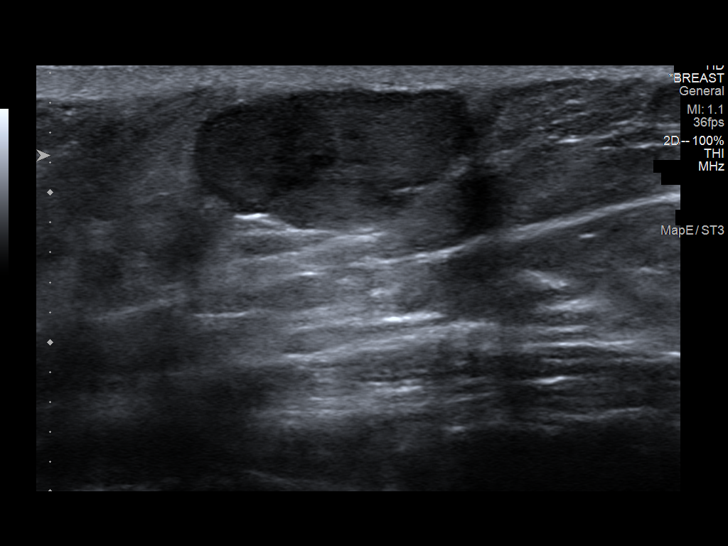
[im 6/12]
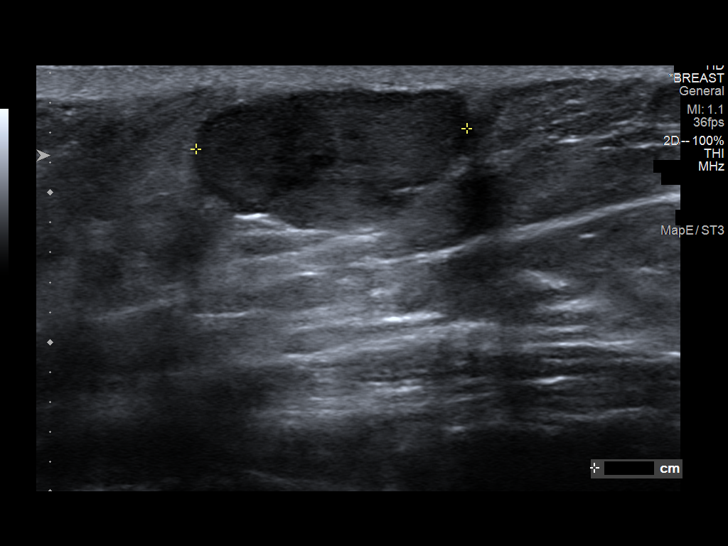
[im 7/12]
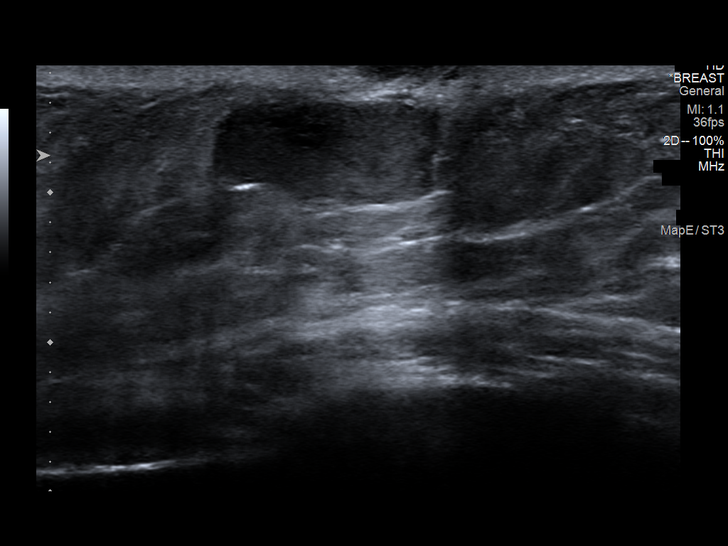
[im 8/12]
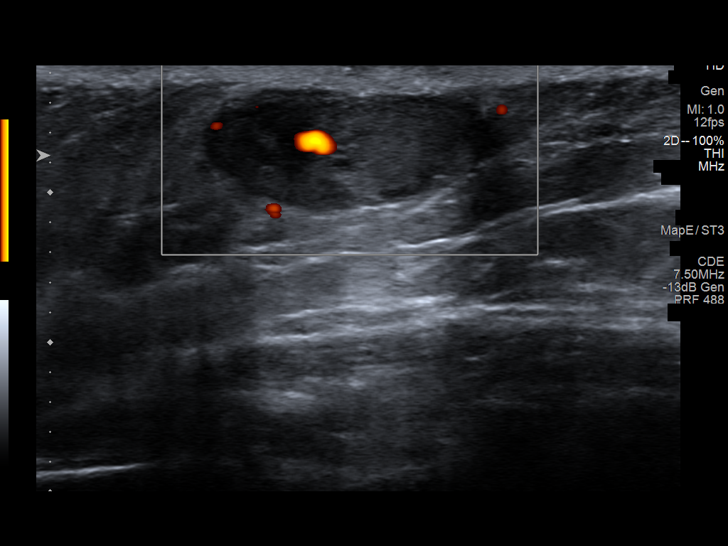
[im 9/12]
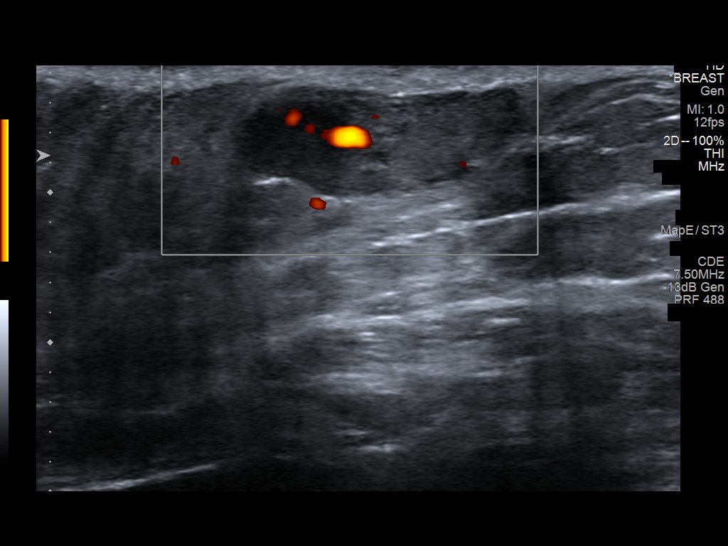
[im 10/12]
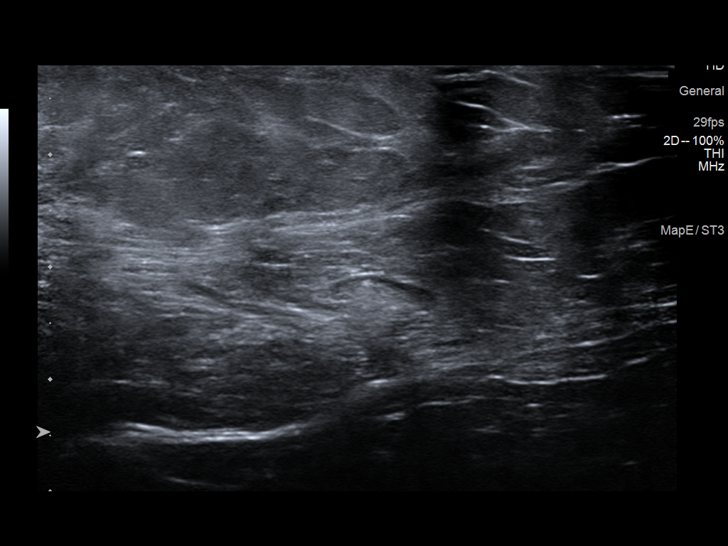
[im 11/12]
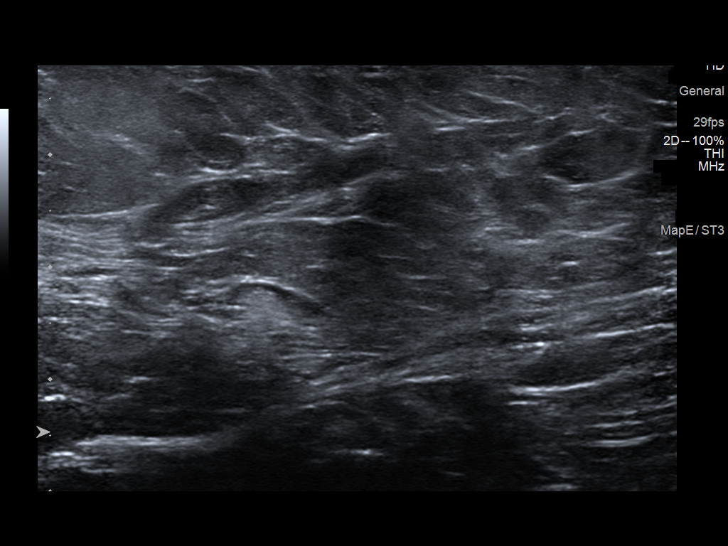
[im 12/12]
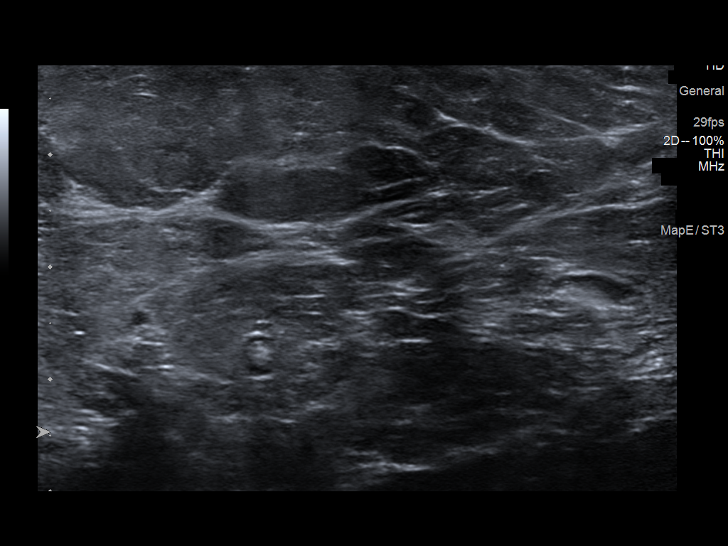

[12 of 12 positions shown; findings below may reference images not displayed]

FINDINGS: On physical exam, there is a raised purple tinged superficial mass
in the left breast at [DATE] 10 cm from the nipple. Patient states
that it has clinically enlarged.

Targeted ultrasound is performed, showing a hypoechoic mass at [DATE]
10 cm from the nipple measuring 1.6 x 1.0 x 1.8 cm located just
below the dermis with a direct tract to the skin surface. There is
internal blood flow.
IMPRESSION: Indeterminate mass in the left breast that is likely a sebaceous
cyst but is enlarging and has internal color flow therefore excision
is recommended.

RECOMMENDATION:
Surgical consultation for excision of the mass in the left breast is
recommended.

I have discussed the findings and recommendations with the patient.
If applicable, a reminder letter will be sent to the patient
regarding the next appointment.

BI-RADS CATEGORY  4: Suspicious.

ADDENDUM:
Surgical consultation has been arranged with Dr. Julene Dolphin at
[REDACTED] on December 20, 2018.

Arberon Jessica, RN on 12/07/2018.

*** End of Addendum ***
FINDINGS: On physical exam, there is a raised purple tinged superficial mass
in the left breast at [DATE] 10 cm from the nipple. Patient states
that it has clinically enlarged.

Targeted ultrasound is performed, showing a hypoechoic mass at [DATE]
10 cm from the nipple measuring 1.6 x 1.0 x 1.8 cm located just
below the dermis with a direct tract to the skin surface. There is
internal blood flow.
IMPRESSION: Indeterminate mass in the left breast that is likely a sebaceous
cyst but is enlarging and has internal color flow therefore excision
is recommended.

RECOMMENDATION:
Surgical consultation for excision of the mass in the left breast is
recommended.

I have discussed the findings and recommendations with the patient.
If applicable, a reminder letter will be sent to the patient
regarding the next appointment.

BI-RADS CATEGORY  4: Suspicious.

## 2021-09-14 ENCOUNTER — Other Ambulatory Visit: Payer: Self-pay

## 2021-09-14 MED ORDER — ISOSORBIDE MONONITRATE ER 30 MG PO TB24: 30.0000 mg | ORAL_TABLET | Freq: Every day | ORAL | 1 refills | Status: AC

## 2022-04-07 ENCOUNTER — Encounter (HOSPITAL_COMMUNITY): Payer: Self-pay | Admitting: *Deleted

## 2022-09-01 IMAGING — CT CT CERVICAL SPINE W/O CM
3 of 4 series · 13 of 33 positions shown, 16 images · non-contrast
Comparison: None.

CLINICAL DATA: Fall, pain

EXAM:
CT HEAD WITHOUT CONTRAST
TECHNIQUE: Contiguous axial images were obtained from the base of the skull
through the vertex without intravenous contrast.

[Series 4: c_spine 2.0 st · axial · 0.31mm/px · z∈[-292,-178]mm · 5 of 87 slices shown, 7 images]
[im 15/87  soft-tissue]
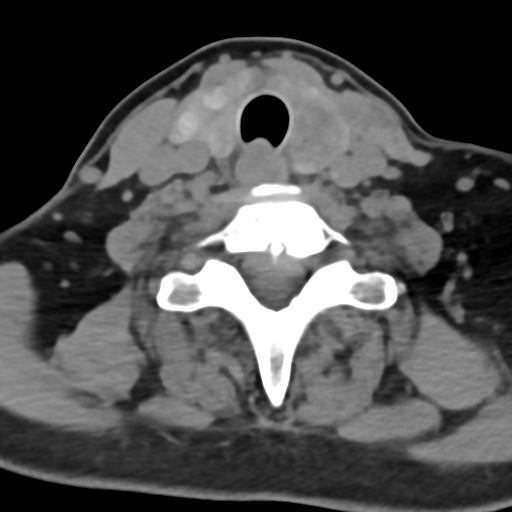
[im 15/87  bone]
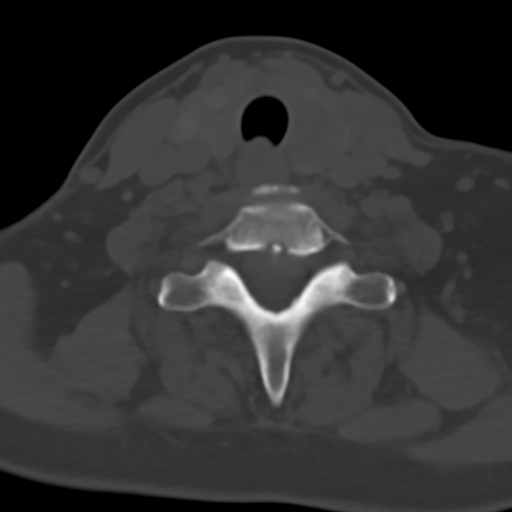
[im 29/87  bone]
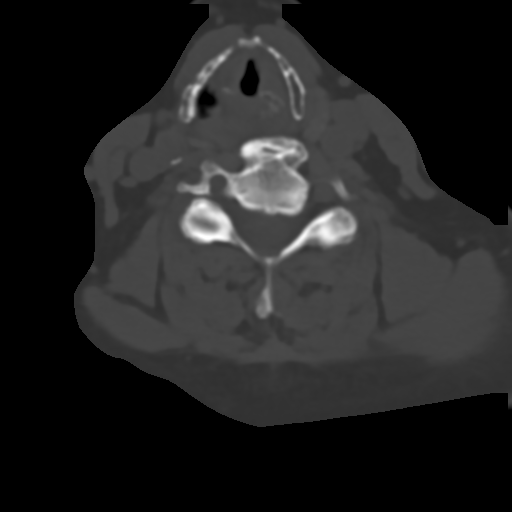
[im 44/87  bone]
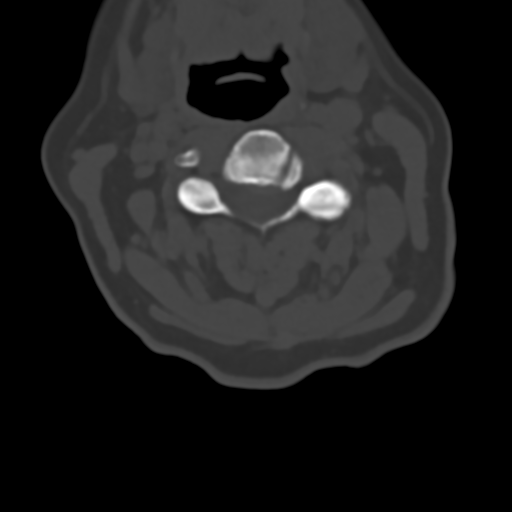
[im 58/87  bone]
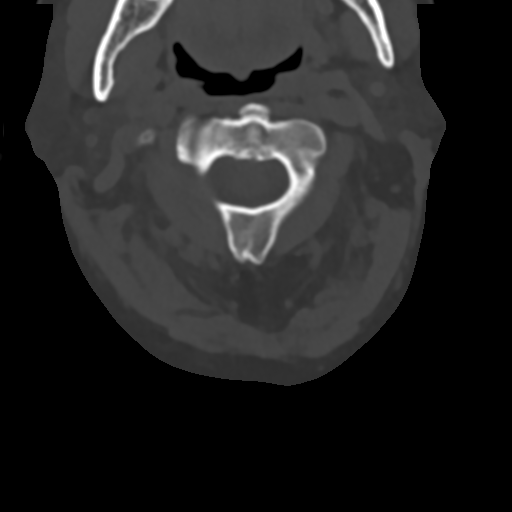
[im 72/87  soft-tissue]
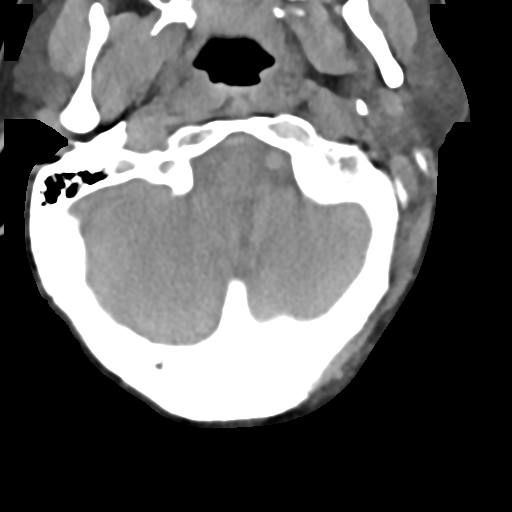
[im 72/87  bone]
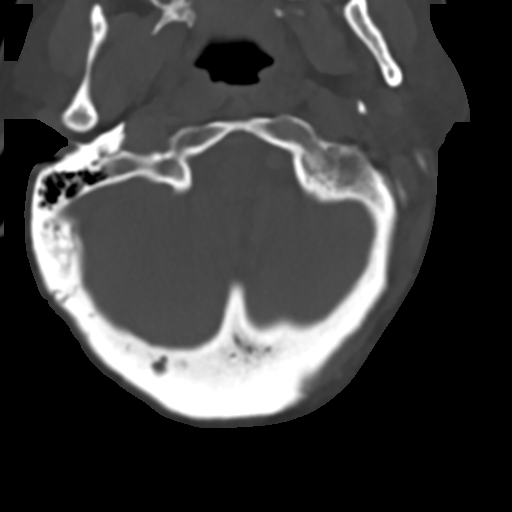

[Series 8: c_spine 2.0 sag bone · sagittal · 0.25mm/px · 5 of 61 slices shown, 6 images]
[im 21/61  bone]
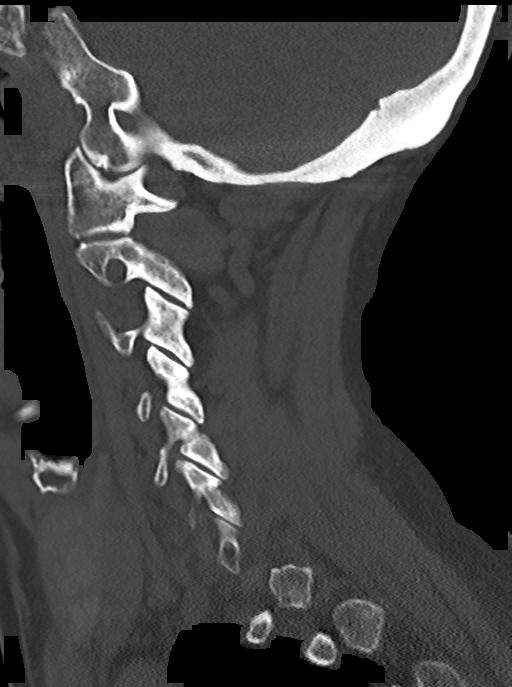
[im 26/61  bone]
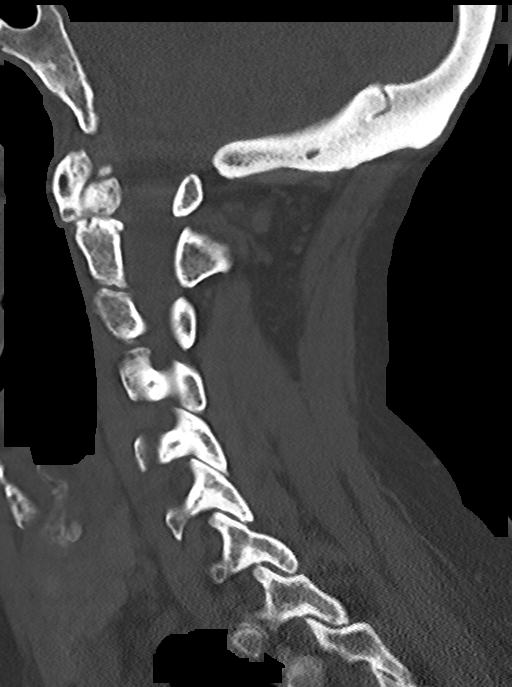
[im 31/61  soft-tissue]
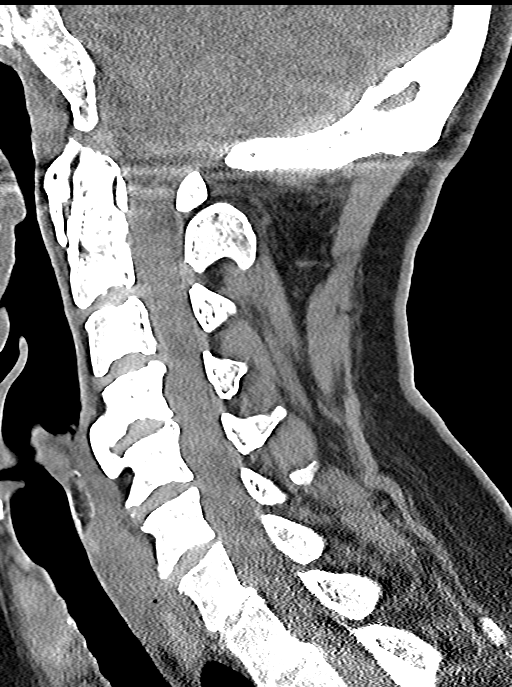
[im 31/61  bone]
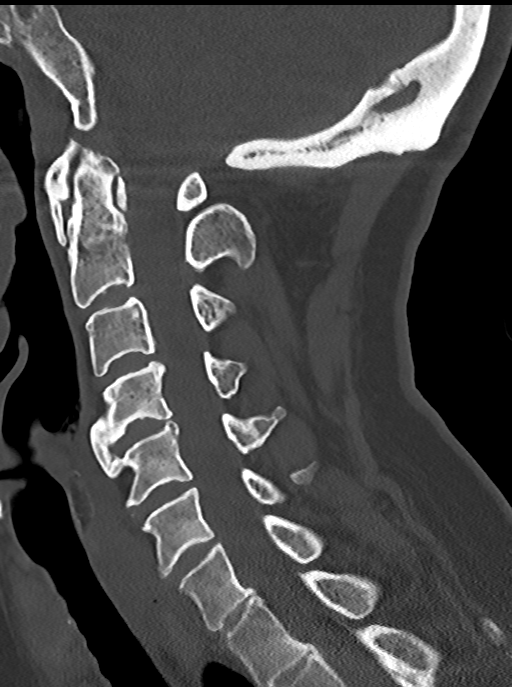
[im 36/61  bone]
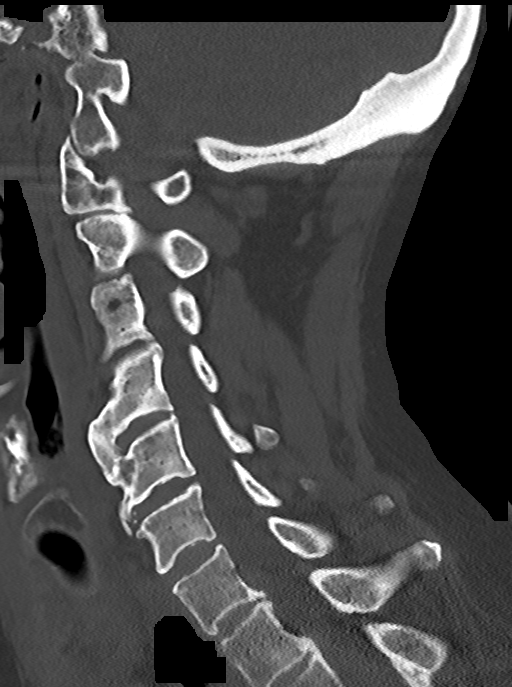
[im 41/61  bone]
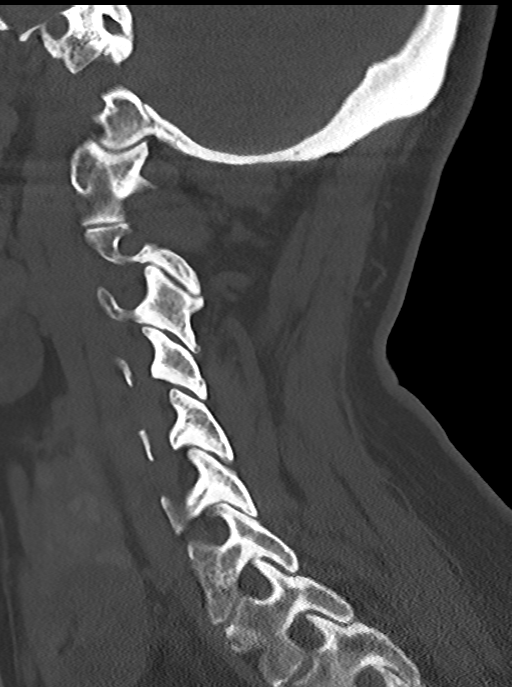

[Series 9: c_spine 2.0 cor bone · coronal · 0.25mm/px · 3 of 61 slices shown]
[im 13/61  bone]
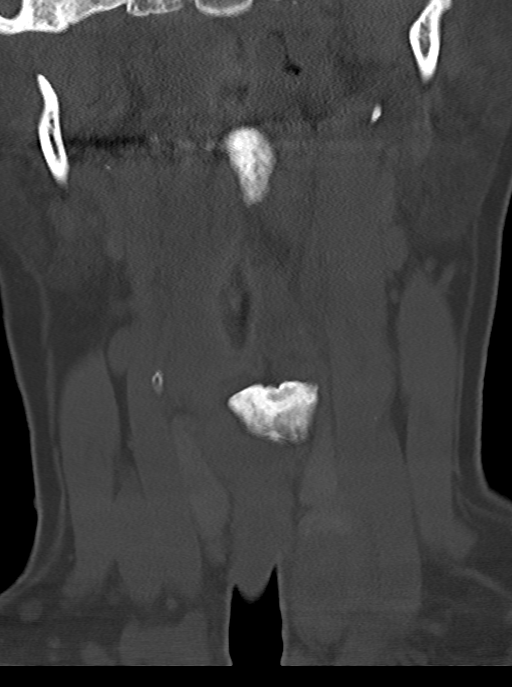
[im 25/61  bone]
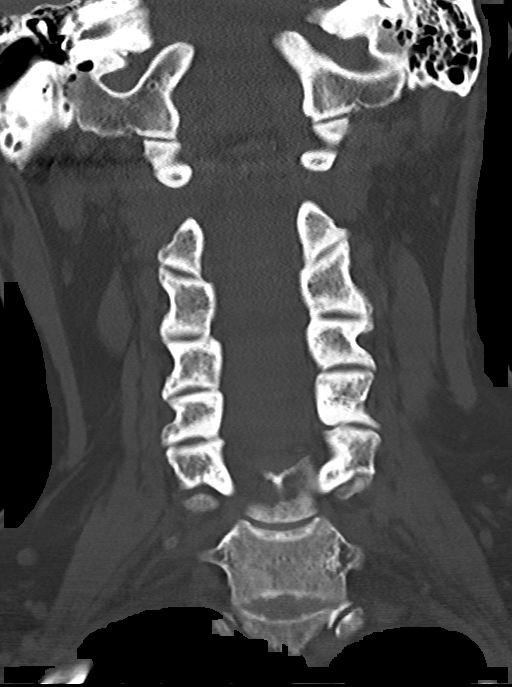
[im 37/61  bone]
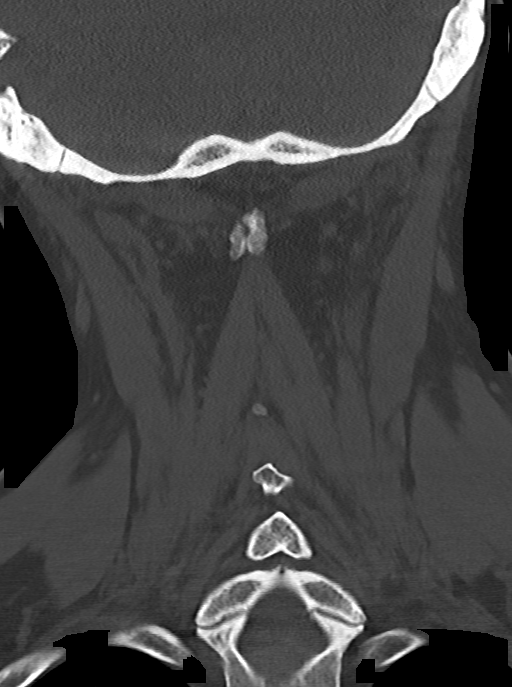

[13 of 33 positions shown; findings below may reference images not displayed]

FINDINGS: Brain: No evidence of acute territorial infarction, hemorrhage,
hydrocephalus,extra-axial collection or mass lesion/mass effect.
Normal gray-white differentiation. Ventricles are normal in size and
contour.

Vascular: No hyperdense vessel or unexpected calcification.

Skull: The skull is intact. No fracture or focal lesion identified.

Sinuses/Orbits: The visualized paranasal sinuses and mastoid air
cells are clear. The orbits and globes intact.

Other: None

Cervical spine:

Alignment: Physiologic

Skull base and vertebrae: Visualized skull base is intact. No
atlanto-occipital dissociation. The vertebral body heights are well
maintained. No fracture or pathologic osseous lesion seen. Large
anterior osteophytes are noted at C4-C5.

Soft tissues and spinal canal: The visualized paraspinal soft
tissues are unremarkable. No prevertebral soft tissue swelling is
seen. The spinal canal is grossly unremarkable, no large epidural
collection or significant canal narrowing.

Disc levels: Cervical spine spondylosis is seen most notable at
C4-C5 with mild neural foraminal narrowing and mild central canal
stenosis.

Upper chest: The lung apices are clear. There is a heterogeneous
enlargement with a 2.4 cm lesion seen within the left thyroid lobe.

Other: None
IMPRESSION: 1. No acute intracranial abnormality.
2.  No acute fracture or malalignment of the spine.
3. Heterogeneous 2 cm lesion within the left thyroid lobe. Recommend
thyroid US (ref: [HOSPITAL]. [DATE]): 143-50).

## 2022-12-14 ENCOUNTER — Other Ambulatory Visit: Payer: Self-pay
